# Patient Record
Sex: Male | Born: 1935 | Race: White | Hispanic: No | Marital: Married | Smoking: Former smoker
Health system: Southern US, Community
[De-identification: ages and names within clinical notes are randomized; demographics above are authoritative.]

## PROBLEM LIST (undated history)

## (undated) DIAGNOSIS — I4891 Unspecified atrial fibrillation: Secondary | ICD-10-CM

## (undated) DIAGNOSIS — W19XXXA Unspecified fall, initial encounter: Secondary | ICD-10-CM

## (undated) DIAGNOSIS — K449 Diaphragmatic hernia without obstruction or gangrene: Secondary | ICD-10-CM

## (undated) DIAGNOSIS — R413 Other amnesia: Secondary | ICD-10-CM

## (undated) DIAGNOSIS — F0280 Dementia in other diseases classified elsewhere without behavioral disturbance: Secondary | ICD-10-CM

## (undated) DIAGNOSIS — I10 Essential (primary) hypertension: Secondary | ICD-10-CM

## (undated) DIAGNOSIS — I495 Sick sinus syndrome: Secondary | ICD-10-CM

## (undated) DIAGNOSIS — F32A Depression, unspecified: Secondary | ICD-10-CM

## (undated) DIAGNOSIS — R809 Proteinuria, unspecified: Secondary | ICD-10-CM

## (undated) DIAGNOSIS — K5790 Diverticulosis of intestine, part unspecified, without perforation or abscess without bleeding: Secondary | ICD-10-CM

## (undated) DIAGNOSIS — G309 Alzheimer's disease, unspecified: Secondary | ICD-10-CM

## (undated) DIAGNOSIS — K2970 Gastritis, unspecified, without bleeding: Secondary | ICD-10-CM

## (undated) DIAGNOSIS — N400 Enlarged prostate without lower urinary tract symptoms: Secondary | ICD-10-CM

## (undated) DIAGNOSIS — E119 Type 2 diabetes mellitus without complications: Secondary | ICD-10-CM

## (undated) DIAGNOSIS — K2980 Duodenitis without bleeding: Secondary | ICD-10-CM

## (undated) DIAGNOSIS — Z889 Allergy status to unspecified drugs, medicaments and biological substances status: Secondary | ICD-10-CM

## (undated) DIAGNOSIS — D649 Anemia, unspecified: Secondary | ICD-10-CM

## (undated) DIAGNOSIS — R972 Elevated prostate specific antigen [PSA]: Secondary | ICD-10-CM

## (undated) DIAGNOSIS — R32 Unspecified urinary incontinence: Secondary | ICD-10-CM

## (undated) DIAGNOSIS — E785 Hyperlipidemia, unspecified: Secondary | ICD-10-CM

## (undated) DIAGNOSIS — I2510 Atherosclerotic heart disease of native coronary artery without angina pectoris: Secondary | ICD-10-CM

## (undated) DIAGNOSIS — F329 Major depressive disorder, single episode, unspecified: Secondary | ICD-10-CM

## (undated) HISTORY — DX: Dementia in other diseases classified elsewhere, unspecified severity, without behavioral disturbance, psychotic disturbance, mood disturbance, and anxiety: F02.80

## (undated) HISTORY — DX: Allergy status to unspecified drugs, medicaments and biological substances: Z88.9

## (undated) HISTORY — DX: Diverticulosis of intestine, part unspecified, without perforation or abscess without bleeding: K57.90

## (undated) HISTORY — DX: Other amnesia: R41.3

## (undated) HISTORY — DX: Gastritis, unspecified, without bleeding: K29.70

## (undated) HISTORY — DX: Hyperlipidemia, unspecified: E78.5

## (undated) HISTORY — DX: Duodenitis without bleeding: K29.80

## (undated) HISTORY — PX: CHOLECYSTECTOMY: SHX55

## (undated) HISTORY — DX: Elevated prostate specific antigen (PSA): R97.20

## (undated) HISTORY — DX: Anemia, unspecified: D64.9

## (undated) HISTORY — DX: Sick sinus syndrome: I49.5

## (undated) HISTORY — DX: Benign prostatic hyperplasia without lower urinary tract symptoms: N40.0

## (undated) HISTORY — DX: Unspecified fall, initial encounter: W19.XXXA

## (undated) HISTORY — DX: Unspecified urinary incontinence: R32

## (undated) HISTORY — DX: Atherosclerotic heart disease of native coronary artery without angina pectoris: I25.10

## (undated) HISTORY — DX: Depression, unspecified: F32.A

## (undated) HISTORY — DX: Alzheimer's disease, unspecified: G30.9

## (undated) HISTORY — DX: Major depressive disorder, single episode, unspecified: F32.9

## (undated) HISTORY — PX: OTHER SURGICAL HISTORY: SHX169

## (undated) HISTORY — DX: Diaphragmatic hernia without obstruction or gangrene: K44.9

## (undated) HISTORY — DX: Proteinuria, unspecified: R80.9

## (undated) HISTORY — DX: Essential (primary) hypertension: I10

## (undated) HISTORY — DX: Type 2 diabetes mellitus without complications: E11.9

## (undated) HISTORY — DX: Unspecified atrial fibrillation: I48.91

---
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
## 2001-05-24 ENCOUNTER — Encounter: Payer: Self-pay | Admitting: Emergency Medicine

## 2001-05-24 ENCOUNTER — Emergency Department (HOSPITAL_COMMUNITY): Admission: EM | Admit: 2001-05-24 | Discharge: 2001-05-25 | Payer: Self-pay | Admitting: Emergency Medicine

## 2001-12-23 ENCOUNTER — Encounter: Admission: RE | Admit: 2001-12-23 | Discharge: 2002-03-23 | Payer: Self-pay | Admitting: Family Medicine

## 2002-03-09 ENCOUNTER — Encounter: Payer: Self-pay | Admitting: Emergency Medicine

## 2002-03-09 ENCOUNTER — Emergency Department (HOSPITAL_COMMUNITY): Admission: EM | Admit: 2002-03-09 | Discharge: 2002-03-10 | Payer: Self-pay | Admitting: Emergency Medicine

## 2002-03-24 ENCOUNTER — Encounter: Payer: Self-pay | Admitting: Family Medicine

## 2002-03-24 ENCOUNTER — Ambulatory Visit (HOSPITAL_COMMUNITY): Admission: RE | Admit: 2002-03-24 | Discharge: 2002-03-24 | Payer: Self-pay | Admitting: Family Medicine

## 2002-04-25 ENCOUNTER — Encounter: Admission: RE | Admit: 2002-04-25 | Discharge: 2002-07-24 | Payer: Self-pay | Admitting: Family Medicine

## 2002-08-04 ENCOUNTER — Encounter: Payer: Self-pay | Admitting: Family Medicine

## 2002-08-04 ENCOUNTER — Encounter: Admission: RE | Admit: 2002-08-04 | Discharge: 2002-08-04 | Payer: Self-pay | Admitting: Family Medicine

## 2002-08-11 ENCOUNTER — Encounter: Admission: RE | Admit: 2002-08-11 | Discharge: 2002-08-11 | Payer: Self-pay | Admitting: Family Medicine

## 2002-08-11 ENCOUNTER — Encounter: Payer: Self-pay | Admitting: Family Medicine

## 2002-09-14 ENCOUNTER — Encounter: Payer: Self-pay | Admitting: Internal Medicine

## 2002-10-26 ENCOUNTER — Encounter: Payer: Self-pay | Admitting: Internal Medicine

## 2002-10-31 ENCOUNTER — Encounter: Admission: RE | Admit: 2002-10-31 | Discharge: 2003-01-26 | Payer: Self-pay | Admitting: Family Medicine

## 2003-04-17 ENCOUNTER — Encounter: Admission: RE | Admit: 2003-04-17 | Discharge: 2003-07-16 | Payer: Self-pay | Admitting: Family Medicine

## 2003-07-17 ENCOUNTER — Encounter: Admission: RE | Admit: 2003-07-17 | Discharge: 2003-10-15 | Payer: Self-pay | Admitting: Family Medicine

## 2003-09-07 ENCOUNTER — Ambulatory Visit (HOSPITAL_COMMUNITY): Admission: RE | Admit: 2003-09-07 | Discharge: 2003-09-07 | Payer: Self-pay

## 2004-01-08 ENCOUNTER — Encounter: Admission: RE | Admit: 2004-01-08 | Discharge: 2004-01-08 | Payer: Self-pay | Admitting: Family Medicine

## 2004-04-09 ENCOUNTER — Encounter: Admission: RE | Admit: 2004-04-09 | Discharge: 2004-07-08 | Payer: Self-pay | Admitting: Family Medicine

## 2004-05-26 ENCOUNTER — Emergency Department (HOSPITAL_COMMUNITY): Admission: EM | Admit: 2004-05-26 | Discharge: 2004-05-26 | Payer: Self-pay | Admitting: Emergency Medicine

## 2004-05-28 ENCOUNTER — Encounter: Admission: RE | Admit: 2004-05-28 | Discharge: 2004-05-28 | Payer: Self-pay | Admitting: Family Medicine

## 2004-09-20 ENCOUNTER — Encounter: Admission: RE | Admit: 2004-09-20 | Discharge: 2004-11-04 | Payer: Self-pay | Admitting: Family Medicine

## 2004-10-18 ENCOUNTER — Encounter: Admission: RE | Admit: 2004-10-18 | Discharge: 2004-10-18 | Payer: Self-pay | Admitting: Family Medicine

## 2005-04-21 ENCOUNTER — Encounter: Admission: RE | Admit: 2005-04-21 | Discharge: 2005-04-21 | Payer: Self-pay | Admitting: Family Medicine

## 2005-05-01 ENCOUNTER — Emergency Department (HOSPITAL_COMMUNITY): Admission: EM | Admit: 2005-05-01 | Discharge: 2005-05-01 | Payer: Self-pay | Admitting: Emergency Medicine

## 2005-07-10 ENCOUNTER — Encounter: Admission: RE | Admit: 2005-07-10 | Discharge: 2005-07-10 | Payer: Self-pay | Admitting: Family Medicine

## 2005-11-11 ENCOUNTER — Encounter: Admission: RE | Admit: 2005-11-11 | Discharge: 2005-11-11 | Payer: Self-pay | Admitting: Family Medicine

## 2006-01-20 ENCOUNTER — Encounter: Admission: RE | Admit: 2006-01-20 | Discharge: 2006-04-20 | Payer: Self-pay | Admitting: Family Medicine

## 2006-02-23 ENCOUNTER — Encounter: Admission: RE | Admit: 2006-02-23 | Discharge: 2006-02-23 | Payer: Self-pay | Admitting: Family Medicine

## 2006-06-18 ENCOUNTER — Encounter: Admission: RE | Admit: 2006-06-18 | Discharge: 2006-06-18 | Payer: Self-pay | Admitting: Family Medicine

## 2006-09-16 ENCOUNTER — Encounter: Admission: RE | Admit: 2006-09-16 | Discharge: 2006-11-10 | Payer: Self-pay | Admitting: Family Medicine

## 2006-09-29 ENCOUNTER — Emergency Department (HOSPITAL_COMMUNITY): Admission: EM | Admit: 2006-09-29 | Discharge: 2006-09-29 | Payer: Self-pay | Admitting: Emergency Medicine

## 2006-10-01 HISTORY — PX: OTHER SURGICAL HISTORY: SHX169

## 2006-10-17 ENCOUNTER — Encounter: Admission: RE | Admit: 2006-10-17 | Discharge: 2006-10-17 | Payer: Self-pay | Admitting: Neurology

## 2006-12-16 ENCOUNTER — Encounter: Admission: RE | Admit: 2006-12-16 | Discharge: 2006-12-16 | Payer: Self-pay | Admitting: Family Medicine

## 2007-06-16 ENCOUNTER — Encounter: Admission: RE | Admit: 2007-06-16 | Discharge: 2007-06-16 | Payer: Self-pay | Admitting: Family Medicine

## 2007-07-05 ENCOUNTER — Inpatient Hospital Stay (HOSPITAL_COMMUNITY): Admission: EM | Admit: 2007-07-05 | Discharge: 2007-07-08 | Payer: Self-pay | Admitting: Emergency Medicine

## 2007-07-06 ENCOUNTER — Ambulatory Visit: Payer: Self-pay | Admitting: Vascular Surgery

## 2007-07-06 ENCOUNTER — Encounter (INDEPENDENT_AMBULATORY_CARE_PROVIDER_SITE_OTHER): Payer: Self-pay | Admitting: *Deleted

## 2007-07-06 ENCOUNTER — Encounter (INDEPENDENT_AMBULATORY_CARE_PROVIDER_SITE_OTHER): Payer: Self-pay | Admitting: Internal Medicine

## 2007-07-07 HISTORY — PX: CARDIAC CATHETERIZATION: SHX172

## 2008-07-19 ENCOUNTER — Encounter: Admission: RE | Admit: 2008-07-19 | Discharge: 2008-07-19 | Payer: Self-pay | Admitting: Family Medicine

## 2009-03-20 ENCOUNTER — Emergency Department (HOSPITAL_COMMUNITY): Admission: EM | Admit: 2009-03-20 | Discharge: 2009-03-20 | Payer: Self-pay | Admitting: Emergency Medicine

## 2009-04-17 ENCOUNTER — Inpatient Hospital Stay (HOSPITAL_COMMUNITY): Admission: RE | Admit: 2009-04-17 | Discharge: 2009-04-18 | Payer: Self-pay | Admitting: Cardiology

## 2009-04-17 HISTORY — PX: PERMANENT PACEMAKER INSERTION: SHX6023

## 2009-06-15 ENCOUNTER — Ambulatory Visit (HOSPITAL_COMMUNITY): Admission: RE | Admit: 2009-06-15 | Discharge: 2009-06-15 | Payer: Self-pay | Admitting: Cardiovascular Disease

## 2009-06-15 ENCOUNTER — Encounter (INDEPENDENT_AMBULATORY_CARE_PROVIDER_SITE_OTHER): Payer: Self-pay | Admitting: Cardiovascular Disease

## 2009-06-19 ENCOUNTER — Emergency Department (HOSPITAL_COMMUNITY): Admission: EM | Admit: 2009-06-19 | Discharge: 2009-06-19 | Payer: Self-pay | Admitting: Emergency Medicine

## 2009-06-25 ENCOUNTER — Encounter: Admission: RE | Admit: 2009-06-25 | Discharge: 2009-06-25 | Payer: Self-pay | Admitting: Cardiovascular Disease

## 2009-07-30 ENCOUNTER — Encounter: Payer: Self-pay | Admitting: Internal Medicine

## 2009-07-31 ENCOUNTER — Ambulatory Visit (HOSPITAL_COMMUNITY): Admission: RE | Admit: 2009-07-31 | Discharge: 2009-07-31 | Payer: Self-pay | Admitting: Endocrinology

## 2009-08-22 ENCOUNTER — Encounter: Admission: RE | Admit: 2009-08-22 | Discharge: 2009-08-22 | Payer: Self-pay | Admitting: Endocrinology

## 2009-08-22 ENCOUNTER — Other Ambulatory Visit: Admission: RE | Admit: 2009-08-22 | Discharge: 2009-08-22 | Payer: Self-pay | Admitting: Interventional Radiology

## 2010-01-22 ENCOUNTER — Telehealth: Payer: Self-pay | Admitting: Internal Medicine

## 2010-01-22 ENCOUNTER — Encounter: Payer: Self-pay | Admitting: Internal Medicine

## 2010-01-23 DIAGNOSIS — E041 Nontoxic single thyroid nodule: Secondary | ICD-10-CM

## 2010-01-23 DIAGNOSIS — K222 Esophageal obstruction: Secondary | ICD-10-CM

## 2010-01-23 DIAGNOSIS — E1159 Type 2 diabetes mellitus with other circulatory complications: Secondary | ICD-10-CM | POA: Insufficient documentation

## 2010-01-23 DIAGNOSIS — K921 Melena: Secondary | ICD-10-CM | POA: Insufficient documentation

## 2010-01-23 DIAGNOSIS — D509 Iron deficiency anemia, unspecified: Secondary | ICD-10-CM | POA: Insufficient documentation

## 2010-01-23 DIAGNOSIS — K5730 Diverticulosis of large intestine without perforation or abscess without bleeding: Secondary | ICD-10-CM | POA: Insufficient documentation

## 2010-01-23 DIAGNOSIS — I10 Essential (primary) hypertension: Secondary | ICD-10-CM

## 2010-01-23 DIAGNOSIS — R413 Other amnesia: Secondary | ICD-10-CM

## 2010-01-23 DIAGNOSIS — I6350 Cerebral infarction due to unspecified occlusion or stenosis of unspecified cerebral artery: Secondary | ICD-10-CM | POA: Insufficient documentation

## 2010-01-23 DIAGNOSIS — E785 Hyperlipidemia, unspecified: Secondary | ICD-10-CM | POA: Insufficient documentation

## 2010-01-23 DIAGNOSIS — K449 Diaphragmatic hernia without obstruction or gangrene: Secondary | ICD-10-CM | POA: Insufficient documentation

## 2010-01-23 DIAGNOSIS — I4891 Unspecified atrial fibrillation: Secondary | ICD-10-CM

## 2010-01-24 ENCOUNTER — Ambulatory Visit: Payer: Self-pay | Admitting: Internal Medicine

## 2010-01-24 LAB — CONVERTED CEMR LAB
Basophils Absolute: 0.1 10*3/uL (ref 0.0–0.1)
Basophils Relative: 1.3 % (ref 0.0–3.0)
Hemoglobin: 10.9 g/dL — ABNORMAL LOW (ref 13.0–17.0)
Lymphocytes Relative: 27.8 % (ref 12.0–46.0)
Monocytes Relative: 12.3 % — ABNORMAL HIGH (ref 3.0–12.0)
Neutro Abs: 4.4 10*3/uL (ref 1.4–7.7)
RBC: 3.7 M/uL — ABNORMAL LOW (ref 4.22–5.81)
RDW: 12.9 % (ref 11.5–14.6)

## 2010-01-29 ENCOUNTER — Encounter: Payer: Self-pay | Admitting: Internal Medicine

## 2010-01-30 ENCOUNTER — Encounter: Payer: Self-pay | Admitting: Internal Medicine

## 2010-02-26 ENCOUNTER — Ambulatory Visit
Admission: RE | Admit: 2010-02-26 | Discharge: 2010-02-26 | Payer: Self-pay | Source: Home / Self Care | Attending: Internal Medicine | Admitting: Internal Medicine

## 2010-02-26 ENCOUNTER — Other Ambulatory Visit: Payer: Self-pay | Admitting: Internal Medicine

## 2010-02-26 DIAGNOSIS — K2980 Duodenitis without bleeding: Secondary | ICD-10-CM

## 2010-02-26 DIAGNOSIS — K5790 Diverticulosis of intestine, part unspecified, without perforation or abscess without bleeding: Secondary | ICD-10-CM

## 2010-02-26 DIAGNOSIS — K449 Diaphragmatic hernia without obstruction or gangrene: Secondary | ICD-10-CM

## 2010-02-26 DIAGNOSIS — K2970 Gastritis, unspecified, without bleeding: Secondary | ICD-10-CM

## 2010-02-26 HISTORY — DX: Gastritis, unspecified, without bleeding: K29.70

## 2010-02-26 HISTORY — DX: Duodenitis without bleeding: K29.80

## 2010-02-26 HISTORY — DX: Diaphragmatic hernia without obstruction or gangrene: K44.9

## 2010-02-26 HISTORY — DX: Diverticulosis of intestine, part unspecified, without perforation or abscess without bleeding: K57.90

## 2010-02-27 LAB — GLUCOSE, CAPILLARY
Glucose-Capillary: 119 mg/dL — ABNORMAL HIGH (ref 70–99)
Glucose-Capillary: 93 mg/dL (ref 70–99)

## 2010-03-04 ENCOUNTER — Encounter: Payer: Self-pay | Admitting: Internal Medicine

## 2010-03-14 NOTE — Letter (Signed)
Summary: [facility]   Imported By: Lamona Curl CMA (AAMA) 01/23/2010 14:51:40  _____________________________________________________________________  External Attachment:    Type:   Image     Comment:   External Document

## 2010-03-14 NOTE — Letter (Signed)
Summary: Patient Notice-Endo Biopsy Results  [facility]  [address] [place], [place] [address]   Phone: [phone]  Fax: [phone]        March 04, 2010 MRN: [id number]    Jon Davis [address] [place], [place]  [address]    Dear Mr. KIMBERLIN,  I am pleased to inform you that the biopsies taken during your recent endoscopic examination did not show any evidence of cancer upon pathologic examination.  Additional information/recommendations:  __No further action is needed at this time.  Please follow-up with      your primary care physician for your other healthcare needs.  __ Please call [phone] to schedule a return visit to review      your condition.  _x_ Continue with the treatment plan as outlined on the day of your      exam. Zantac 150 mg daily  _   Please call us if you are having persistent problems or have questions about your condition that have not been fully answered at this time.  Sincerely,  Hart Carwin MD  This letter has been electronically signed by your physician.  Appended Document: Patient Notice-Endo Biopsy Results LETTER MAILED

## 2010-03-14 NOTE — Assessment & Plan Note (Signed)
Summary: Gastroenterology  Jon Davis, Jon Davis    Office No. [id number] Page September 14, 2002    Lanae Boast, M.D. [address] [place], [address] [place], [place] [address]   RE:     Jon Davis, Jon Davis    Office No. [id number] DOB:  2026-04-21  Dear Maryruth Hancock:  Thank you so much for sending Jon Davis to [place] for evaluation of swallowing problems as well as iron deficiency anemia.  Thank you for forwarding your office notes.  He is a very nice 77 year old gentleman who has been a patient of yours for a long time and has had multiple transient ischemic attacks, for which he takes Plavix and aspirin 81 mg a day.  He also has a history of gastroesophageal reflux, which has been quite well controlled with Prevacid 30 mg a day.  Recently, he has developed sensation of pain or dysphagia usually with the first bite or first swallow of a liquid.  He has not had any regurgitation of the food, but had some uneasy feeling in his lower mid-sternum.  There has been hoarseness or noticeable cough.  He has never had upper endoscopy.  His other problem has been occasional constipation and gas.  PAST MEDICAL HISTORY ALLERGIES:   None.  MEDICATIONS:    Prevacid 30 mg p.o. q.d., Hyzaar 50/12.5 p.o. q.d., Plavix 75 mg p.o. q.d., Pravachol 40 mg p.o. q.d., aspirin 81 mg p.o. q.d., alprazolam 0.5 mg p.r.n., hydrocodone p.r.n., and Darvocet N 100 p.r.n.  Past illnesses:  Diabetes, diet controlled, hyperlipidemia, cerebrovascular accident/transient ischemic attack, arthritis of the cervical spine, depression.  Operations:  Cholecystectomy and prostate surgery in 2002, and tonsillectomy.   FAMILY HISTORY:   Positive for heart disease in father.   SOCIAL HISTORY:   Stopped smoking in 1975.  Drinks alcohol rarely.  He is married, has children.  REVIEW OF SYSTEMS:   Positive for arthritis, constipation, and difficulty swallowing.   PHYSICAL EXAM:   Blood pressure 120/58.  Weight 193 pounds.  Pulse 64.  He was  alert and oriented, speaking rather slowly, but answering appropriately.  Lungs were clear to auscultation.  COR:  Normal S1, normal S2.  Sclerae was nonicteric.  Abdomen was soft and nontender, relaxed, normoactive bowel sounds, liver edge at costal margin and no bruit.  Rectal exam showed 2+ prostate, soft, regular.  Stool was hemoccult negative.  Extremities:  No edema.   LABORATORIES:   In the office shows that he had 11% iron saturation with a hemoglobin of 12.2.   IMPRESSION:   77 year old gentleman with iron deficiency anemia without obvious source of bleeding, which could be either from long term aspirin use causing minor disruption of the lining of the stomach or the small bowel.  He may possibly have a low gastrointestinal lesion since he has never had a colonoscopy.  His dysphagia may be related to hiatal hernia, esophageal ring, Schatzki's ring, or possibly due to mild stricture.  PLAN:  I have given Mr. Lesesne some iron samples to take and asked him to continue Prevacid 30 mg a day.  I think he needs both upper endoscopy and colonoscopy to evaluate his anemia as well as his swallowing problems.  I doubt that we are dealing with Barrett's esophagus, but that needs to be also ruled out.  Question with him is whether he should stay on his Plavix and aspirin because of the previous history of transient ischemic attacks.  I am reluctant to automatically stop it.  We  usually stop it seven days prior to the procedure and then if he has polyps, he would have to still stay off Plavix and aspirin for another 14 days, which would be a total of three weeks.  I am not sure whether that is not too risky considering his past history.  I would like your input into this.   Could you please let me know whether we should discontinue or reduce the dose of his anticoagulants or perhaps just do the procedures while he is on the medications and in case we find polyp, of course I would not be able to remove  it, but I probably would be able to biopsy safely any abnormal tissue.  He has been scheduled for sometime in September and we have plenty of time to make that decision about instant coagulation.    Thank you so much for letting me share in his care.   Best regards,    Jon Davis. Jon Davis, M.D.  [id number] D:  2002-09-14 00:00:00.0; T:  ; Job [id number]

## 2010-03-14 NOTE — Procedures (Addendum)
Summary: Upper Endoscopy  Patient: Jon Davis Note: All result statuses are Final unless otherwise noted.  Tests: (1) Upper Endoscopy (EGD)   EGD Upper Endoscopy       DONE     [facility]     [address]     [place], [place]  [address]           ENDOSCOPY PROCEDURE REPORT           PATIENT:  Jon Davis, Jon Davis  MR#:  [id number]     BIRTHDATE:  [birth year], 83 yrs. old  GENDER:  male           ENDOSCOPIST:  Hedwig Morton. Juanda Chance, MD     Referred by:  Ace Gins, M.D.           PROCEDURE DATE:  02/26/2010     PROCEDURE:  EGD with biopsy, 43239, Maloney Dilation of Esophagus     ASA CLASS:  Class III     INDICATIONS:  dysphagia, iron deficiency anemia Hgb 10.8, iron sat     6%     EGD 2004 showed es. stricture, dilated to 33F           MEDICATIONS:   Versed 4 mg, Fentanyl 25 mcg     TOPICAL ANESTHETIC:  Exactacain Spray           DESCRIPTION OF PROCEDURE:   After the risks benefits and     alternatives of the procedure were thoroughly explained, informed     consent was obtained.  [facility] [id number] endoscope was     introduced through the mouth and advanced to the second portion of     the duodenum, without limitations.  The instrument was slowly     withdrawn as the mucosa was fully examined.     <<PROCEDUREIMAGES>>           Presbyesophagus was found (see image1 and image11). tortuous     esophagus, no definite stricture maloney dilator 33F passed  A     hiatal hernia was found (see image11 and image1). 2 cm small HH     Moderate gastritis was found. linear erosions antrum, low grade     blood loss With standard forceps, a biopsy was obtained and sent     to pathology (see image2).  Duodenitis was found (see image3,     image4, image5, image6, and image7). 1-2 mm superficial     ulcerations  Otherwise the examination was normal (see image10 and     image9).    Retroflexed views revealed no abnormalities.    The     scope was then withdrawn from the patient  and the procedure     completed.           COMPLICATIONS:  None           ENDOSCOPIC IMPRESSION:     1) Presbyesophagus     2) Hiatal hernia     3) Moderate gastritis     4) Duodenitis     5) Otherwise normal examination     low grade GI blood loss from gastric and duodenal erosions     RECOMMENDATIONS:     1) Await biopsy results     add PPI or Zantac 150 mg po qd           REPEAT EXAM:  No           ______________________________     Hedwig Morton. Juanda Chance, MD  CC:  Ace Gins, M.D.           n.     eSIGNED:   Hedwig Morton. Madora Barletta at 02/26/2010 03:04 PM           Page 2 of 3   [place], [place], [id number]  Note: An exclamation mark (!) indicates a result that was not dispersed into the flowsheet. Document Creation Date: 02/26/2010 3:04 PM _______________________________________________________________________  (1) Order result status: Final Collection or observation date-time: 02/26/2010 14:35 Requested date-time:  Receipt date-time:  Reported date-time:  Referring Physician:   Ordering Physician: Lina Sar [phone]) Specimen Source:  Source: Launa Grill Order Number: [id number] Lab site:

## 2010-03-14 NOTE — Procedures (Signed)
Summary: EGD    Patient Name: Jon Davis, Jon Davis MRN:  Procedure Procedures: Panendoscopy (EGD) CPT: 43235.    with esophageal dilation. CPT: G9296129.  Personnel: Endoscopist: Dora L. Juanda Chance, MD.  Referred By: Talmadge Coventry, MD.  Exam Location: Exam performed in Outpatient Clinic. Outpatient  Patient Consent: Procedure, Alternatives, Risks and Benefits discussed, consent obtained, from patient. Consent was obtained by the RN.  Indications Symptoms: Dysphagia. Reflux symptoms for 1-5 yrs, occurring daily. hiccups.  History  Current Medications: Patient is taking a non-steroidal medication. Patient is on an anticoagulant.  Comments: Plavix and ASA d/c'ed  1 week ago Pre-Exam Physical: Performed Oct 26, 2002  Cardio-pulmonary exam, HEENT exam, Abdominal exam, Extremity exam, Neurological exam, Mental status exam WNL.  Exam Exam Info: Maximum depth of insertion Duodenum, intended Duodenum. Vocal cords visualized. Gastric retroflexion performed. Images taken. ASA Classification: II. Tolerance: good.  Sedation Meds: Patient assessed and found to be appropriate for moderate (conscious) sedation. Fentanyl 25 mcg. given IV. Versed 3 mg. given IV.  Monitoring: BP and pulse monitoring done. Oximetry used. Supplemental O2 given  Findings Dilation: Distal Esophagus. Maloney dilator used, Diameter: 42,44,46, 48 mm, 4  total dilators used. Patient tolerance excellent. Outcome: successful.  STRICTURE / STENOSIS: Distal Esophagus.  Constriction: partial. Etiology: benign due to reflux. 35 cm from mouth. Lumen diameter is 14 mm. ICD9: Esophageal Stricture: 530.3. Comment: no active erosions.  - HIATAL HERNIA: Diaphragm 38 cm from mouth. Z-line/GE Junction 35 cm from mouth. 3 cms. in length. reducible. ICD9: Hernia, Hiatal: 553.3. Normal: Duodenal Apex.   Assessment Abnormal examination, see findings above.  Diagnoses: 553.3: Hernia, Hiatal.  530.3: Esophageal Stricture.    Comments: es. stricture, s/p dilation to 83F using Maloney dilators Events  Unplanned Intervention: No unplanned interventions were required.  Unplanned Events: There were no complications. Plans Medication(s): PPI: Lansoprazole/Prevacid 30 mg BID, starting Oct 26, 2002   Patient Education: Patient given standard instructions for: Hiatal Hernia. Reflux.  Comments: Prevacid bid for 2 weeks then qd, resume Plavix, reduce ASA to 81 mg qd with food Disposition: After procedure patient sent to recovery. After recovery patient sent home.     cc: Harvie Heck, M.D.  This report was created from the original endoscopy report, which was reviewed and signed by the above listed endoscopist.

## 2010-03-14 NOTE — Assessment & Plan Note (Signed)
Summary: Anemia/Jon Davis   History of Present Illness Visit Type: Initial Consult Primary GI MD: Lina Sar MD Primary Provider: Ace Gins, MD Requesting Provider: Ace Gins, MD Chief Complaint: anemia cannot find the source.  C/o alot of pain in groin, fatigue, and blood in stool. History of Present Illness:   This is an 77 year old white male who has iron deficiency anemia. His hemoglobin 10.8 on 01/23/10, MCV was 86 and he had a 6% iron saturation. We have seen him for a colorectal screening in the past. A colonoscopy in September 2004 showed mild diverticulosis of the left colon. An upper endoscopy at that time showed only a mild esophageal stricture. He was dilated with a Maloney dilator from 42-48 [place]. There was a 3 cm hiatal hernia. He has been on Coumadin for atrial fibrillation since March 2011. He also has a permanent transvenous pacemaker. He is a diabetic. Patient is complaining of solid food dysphagia and hiccups.   GI Review of Systems    Reports abdominal pain, chest pain, and  dysphagia with solids.      Denies acid reflux, belching, bloating, dysphagia with liquids, heartburn, loss of appetite, nausea, vomiting, vomiting blood, weight loss, and  weight gain.      Reports black tarry stools, constipation, and  rectal bleeding.     Denies anal fissure, change in bowel habit, diarrhea, diverticulosis, fecal incontinence, heme positive stool, hemorrhoids, irritable bowel syndrome, jaundice, light color stool, liver problems, and  rectal pain.    Current Medications (verified): 1)  Plavix 75 Mg Tabs (Clopidogrel Bisulfate) .[name].. 1 By Mouth At Bedtime 2)  Hydrocodone-Acetaminophen 5-500 Mg Tabs (Hydrocodone-Acetaminophen) .[name].. 1-2 Q 4 Hours As Needed For Pain 3)  Sertraline Hcl 50 Mg Tabs (Sertraline Hcl) .[name].. 1 By Mouth Once Daily 4)  Crestor 20 Mg Tabs (Rosuvastatin Calcium) .[name].. 1 By Mouth Once Daily 5)  Isosorbide Mononitrate Cr 30 Mg Xr24h-Tab (Isosorbide  Mononitrate) .[name].. 1 By Mouth Once Daily 6)  Alprazolam 0.25 Mg Tabs (Alprazolam) .[name].. 1 By Mouth Three Times A Day As Needed Anxiety 7)  Glipizide 5 Mg Tabs (Glipizide) .[name].. 1 By Mouth Every Morning 8)  Tramadol Hcl 50 Mg Tabs (Tramadol Hcl) .[name].. 1-2 Three Times A Day As Needed Pain 9)  Amlodipine Besylate 5 Mg Tabs (Amlodipine Besylate) .[name].. 1 By Mouth Once Daily 10)  Aricept 23 Mg Tabs (Donepezil Hcl) .... 1/2 Tablet Every Morning and 1/2 Tablet At Bedtime 11)  Coumadin 5 Mg Tabs (Warfarin Sodium) .... Take As Directed 12)  Aspirin 81 Mg Tbec (Aspirin) .[name].. 1 By Mouth Once Daily  Allergies (verified): No Known Drug Allergies  Past History:  Past Medical History: Reviewed history from 01/23/2010 and no changes required. Current Problems:  THYROID NODULE (ICD-241.0) DIABETES MELLITUS, TYPE II (ICD-250.00) HYPERTENSION (ICD-401.9) HYPERLIPIDEMIA (ICD-272.4) DEMENTIA (ICD-294.8) ATRIAL FIBRILLATION (ICD-427.31) ANEMIA, IRON DEFICIENCY (ICD-280.9) HEMOCCULT POSITIVE STOOL (ICD-578.1) DIVERTICULOSIS, COLON (ICD-562.10) Hx of ESOPHAGEAL STRICTURE (ICD-530.3) HIATAL HERNIA (ICD-553.3)    Past Surgical History: "TUNA"-Prostate Surgery Tonsillectomy Pacemaker placement Cholecystectomy Polyp removal from vocal cords Appendectomy  Family History: Family History of Heart Disease: Father Family History of Kidney Disease: brother No FH of Colon Cancer:  Social History: Reviewed history from 01/23/2010 and no changes required. Patient is a former smoker. -stopped in 1975 Alcohol Use - yes-rare Illicit Drug Use - no married  5 children  Daily Caffeine Use  Review of Systems       The patient complains of fatigue, urination changes/pain, and urine leakage.  The  patient denies allergy/sinus, anemia, anxiety-new, arthritis/joint pain, back pain, blood in urine, breast changes/lumps, change in vision, confusion, cough, coughing up blood, depression-new, fainting, fever,  headaches-new, hearing problems, heart murmur, heart rhythm changes, itching, muscle pains/cramps, night sweats, nosebleeds, shortness of breath, skin rash, sleeping problems, sore throat, swelling of feet/legs, swollen lymph glands, thirst - excessive, urination - excessive, vision changes, and voice change.         Pertinent positive and negative review of systems were noted in the above HPI. All other ROS was otherwise negative.   Vital Signs:  Patient profile:   77 year old male Height:      72 inches Weight:      198 pounds BMI:     26.95 Pulse rate:   68 / minute Pulse rhythm:   regular BP sitting:   108 / 50  (left arm)  Vitals Entered By: Milford Cage NCMA (January 24, 2010 10:15 AM)  Physical Exam  General:  elderly-appearing alert and oriented but chronically ill Eyes:  nonicteric Mouth:  normal oral mucosa Neck:  jugular veins not distended Lungs:  clear breath sounds Heart:  normal S1 normal S2 Abdomen:  soft nontender abdomen with normoactive bowel sounds. No focal tenderness. Liver edge at costal margin. No bruit Rectal:  large amount of hard Hemoccult positive stool Skin:  Intact without significant lesions or rashes. Psych:  Alert and cooperative. Normal mood and affect.   Impression & Recommendations:  Problem # 1:  ANEMIA, IRON DEFICIENCY (ICD-280.9) Patient has iron deficiency anemia with an iron saturation of 6% and heme positive stool. A prior colonoscopy in 2004 did not show any lesion. He is now on Coumadin and complains of dysphagia. His blood loss may be related to chronic esophagitis and an esophageal stricture. We still need to rule out a lower GI source of bleeding such as a polyp or cancer. We will schedule an upper endoscopy and colonoscopy off Coumadin. Of note, should his hemoglobin ever become low enough that he needs blood, patient requests that no blood products be administered to him.  Orders: Colon/Endo (Colon/Endo) TLB-CBC Platelet -  w/Differential (85025-CBCD) TLB-B12, Serum-Total ONLY (04540-J81)  Problem # 2:  HEMOCCULT POSITIVE STOOL (ICD-578.1) We need to rule out upper versus lower GI source of bleeding.  Orders: Colon/Endo (Colon/Endo)  Problem # 3:  Hx of ESOPHAGEAL STRICTURE (ICD-530.3) Patient has recurrent solid food dysphagia and hiccups indicative of possible esophagitis. He will most likely need a proton pump inhibitor to control his symptoms. For now, we will schedule him for an upper endoscopy with possible dilatation.  Patient Instructions: 1)  You have been scheduled for an endoscopyand possible dilatation and colonoscopy. Please follow written instructions that were given to you at your office visit today. 2)  Please pick up your prescription for Miralax, Dulcolax and Reglan at the pharmacy. An electronic presription has already been sent.  3)  Your physician requests that you go to the basement floor of our office to have the following labwork completed before leaving today: CBC, Vitamin B12 level. 4)  Copy sent to : [facility]. 5)  The medication list was reviewed and reconciled.  All changed / newly prescribed medications were explained.  A complete medication list was provided to the patient / caregiver. Prescriptions: DULCOLAX 5 MG  TBEC (BISACODYL) Day before procedure take 2 at 3pm and 2 at 8pm.  #4 x 0   Entered by:   Lamona Curl CMA (AAMA)  Authorized by:   Hart Carwin MD   Signed by:   Lamona Curl CMA (AAMA) on 01/24/2010   Method used:   Elect[facility] to        [id number]       [address]       [place], [place]  [address]       Ph: [phone] or [phone]       Fax: [phone]   RxID:   [id number] REGLAN 10 MG  TABS (METOCLOPRAMIDE HCL) As per prep instructions.  #2 x 0   Entered by:   Lamona Curl CMA (AAMA)   Authorized by:   Hart Carwin MD   Signed by:   Lamona Curl CMA (AAMA) on 01/24/2010   Method used:   [facility]        [facility]  [address] [id number]       [address]       [place], [place]  [address]       Ph: [phone] or [phone]       Fax: [phone]   RxID:   [id number] MIRALAX   POWD (POLYETHYLENE GLYCOL 3350) As per prep  instructions.  #255gm x 0   Entered by:   Lamona Curl CMA (AAMA)   Authorized by:   Hart Carwin MD   Signed by:   Lamona Curl CMA (AAMA) on 01/24/2010   Method used:   [facility]        [facility]  [address] [facility]       [address]       [place], [place]  [address]       Ph: [phone] or [phone]       Fax: [phone]   RxID:   [id number]

## 2010-03-14 NOTE — Letter (Signed)
Summary: [facility]   Imported By: Lamona Curl CMA (AAMA) 01/23/2010 14:52:53  _____________________________________________________________________  External Attachment:    Type:   Image     Comment:   External Document

## 2010-03-14 NOTE — Miscellaneous (Signed)
Summary: New Rx Zantac/Iron Supplements  Clinical Lists Changes  Medications: Added new medication of ZANTAC 150 MG TABS (RANITIDINE HCL) 1 tablet by mouth daily - Signed Added new medication of FERROUS SULFATE 325 (65 FE) MG TABS (FERROUS SULFATE) 1 tablet by mouth three times daily - Signed Rx of ZANTAC 150 MG TABS (RANITIDINE HCL) 1 tablet by mouth daily;  #30 x 6;  Signed;  Entered by: Grier Rocher, RN;  Authorized by: Hart Carwin MD;  Method used: [facility]  [address] [id number] [address], [place], [place]  [address], Ph: [phone] or [phone], Fax: [phone] Rx of FERROUS SULFATE 325 (65 FE) MG TABS (FERROUS SULFATE) 1 tablet by mouth three times daily;  #100 x 1;  Signed;  Entered by: Grier Rocher, RN;  Authorized by: Hart Carwin MD;  Method used: [facility]  [address] [facility] [address] [place], [place], [place]  [address], Ph: [phone] or [phone], Fax: [phone]    Prescriptions: FERROUS SULFATE 325 (65 FE) MG TABS (FERROUS SULFATE) 1 tablet by mouth three times daily  #100 x 1   Entered by:   Grier Rocher, RN   Authorized by:   Hart Carwin MD   Signed by:   Grier Rocher, RN on 02/26/2010   Method used:   Electronically to        [facility]  [address] [facility]       [address]       [place], [place]  [address]       Ph: [phone] or [phone]       Fax: [phone]   RxID:   [id number] ZANTAC 150 MG TABS (RANITIDINE HCL) 1 tablet by mouth daily  #30 x 6   Entered by:   Grier Rocher, RN   Authorized by:   Hart Carwin MD   Signed by:   Grier Rocher, RN on 02/26/2010   Method used:   Electronically [facility]        [facility]  [address] [facility]       [address]       [place], [place]  [address]       Ph: [phone] or [phone]       Fax: [phone]   RxID:   [id number]

## 2010-03-14 NOTE — Progress Notes (Signed)
Summary: Anemia-Needs appt within 1-2wks  Phone Note From Other Clinic Call back at [facility] Phone [phone]   Caller: Omer Jack Stone's office [phone] Call For: Dr Juanda Chance Reason for Call: Schedule Patient Appt Summary of Call: Would like pt seen within i to 2wks for new onset anemia.  Last office visit w/dr Juanda Chance 09-14-2002 Had Endo Colon 10-26-2002. Would like for [place] to call patient back directy with appoinment. Initial call taken by: Leanor Kail [facility],  January 22, 2010 1:22 PM  Follow-up for Phone Call        Called and spoke with patient's wife. Scheduled appointment for 01/24/10 at 10:45 AM. Dr. Hayden Rasmussen office to fax records. Faxed appt date to Angie at Dr. Hayden Rasmussen office [phone]) Follow-up by: Jesse Fall RN,  January 22, 2010 3:10 PM

## 2010-03-14 NOTE — Letter (Signed)
Summary: Diabetic Instructions   Gastroenterology  [address] [place], [place] [address]   Phone: [phone]  Fax: [phone]    Jon Davis 1926-05-21 MRN: [id number]   _ x _   ORAL DIABETIC MEDICATION INSTRUCTIONS           GLIPIZIDE The day before your procedure:   Take your diabetic pill as you do normally  The day of your procedure:   Do not take your diabetic pill    We will check your blood sugar levels during the admission process and again in Recovery before discharging you home  ________________________________________________________________________

## 2010-03-14 NOTE — Letter (Signed)
Summary: Anticoagulation Modification Letter  [facility]  [address] [place], [place] [address]   Phone: [phone]  Fax: [phone]    January 24, 2010  Re:    Jon Davis DOB:    04/06/1926 MRN:    [id number]    Dear Croitroru:  We have scheduled the above patient for an endoscopic procedure (endoscopy/colonoscopy). Our records show that he is on anticoagulation therapy. Please advise as to how long the patient may come off their therapy of Coumadin prior to the scheduled procedure(s) on 02/26/10.   Please fax back the completed form to Dottie at [phone].  Thank you for your help with this matter.  Sincerely,  Dottie Nelson-Smith CMA Duncan Dull)   Physician Recommendation:   Hold Coumadin 5 days prior ____________  Other ______________________________     Appended Document: Anticoagulation Modification Letter see scanned note from Dr Crawford Givens. Per [facility], patient okay to discontinue coumadin 5 days prior to test. I have spoken to patient's wife (also his caregiver) and have advised her that patient has been okayed to discontine coumadin 5 days prior to test and patient to restart after Dr Juanda Chance gives okay. She verbalizes understanding.

## 2010-03-14 NOTE — Letter (Signed)
Summary: [facility] Instructions  [facility]  [address] [place], [place] [address]   Phone: [phone]  Fax: [phone]       Jon Davis    1926-11-07    MRN: [id number]       Procedure Day /Date: Tuesday 02/26/10     Arrival Time: 1:00 pm     Procedure Time: 2:00 pm     Location of Procedure:                    Jon Davis  [facility] (4th Floor)  PREPARATION FOR COLONOSCOPY WITH MIRALAX  Starting 5 days prior to your procedure 02/21/10 do not eat nuts, seeds, popcorn, corn, beans, peas,  salads, or any raw vegetables.  Do not take any fiber supplements (e.g. Metamucil, Citrucel, and Benefiber). ____________________________________________________________________________________________________   THE DAY BEFORE YOUR PROCEDURE         DATE: 02/25/10 DAY: Monday  1   Drink clear liquids the entire day-NO SOLID FOOD  2   Do not drink anything colored red or purple.  Avoid juices with pulp.  No orange juice.  3   Drink at least 64 oz. (8 glasses) of fluid/clear liquids during the day to prevent dehydration and help the prep work efficiently.  CLEAR LIQUIDS INCLUDE: Water Jello Ice Popsicles Tea (sugar ok, no milk/cream) Powdered fruit flavored drinks Coffee (sugar ok, no milk/cream) Gatorade Juice: apple, white grape, white cranberry  Lemonade Clear bullion, consomm, broth Carbonated beverages (any kind) Strained chicken noodle soup Hard Candy  4   Mix the entire bottle of Miralax with 64 oz. of Gatorade/Powerade in the morning and put in the refrigerator to chill.  5   At 3:00 pm take 2 Dulcolax/Bisacodyl tablets.  6   At 4:30 pm take one Reglan/Metoclopramide tablet.  7  Starting at 5:00 pm drink one 8 oz glass of the Miralax mixture every 15-20 minutes until you have finished drinking the entire 64 oz.  You should finish drinking prep around 7:30 or 8:00 pm.  8   If you are nauseated, you may take the 2nd Reglan/Metoclopramide tablet  at 6:30 pm.        9    At 8:00 pm take 2 more DULCOLAX/Bisacodyl tablets.     THE DAY OF YOUR PROCEDURE      DATE:  02/26/10 DAY: Tuesday  You may drink clear liquids until 12:00 pm  (2 HOURS BEFORE PROCEDURE).   MEDICATION INSTRUCTIONS  Unless otherwise instructed, you should take regular prescription medications with a small sip of water as early as possible the morning of your procedure.  Diabetic patients - see separate instructions.  You will be contaced by our office prior to your procedure for directions on holding your Coumadin/Warfarin.  If you do not hear from our office 1 week prior to your scheduled procedure, please call [phone] to discuss.         OTHER INSTRUCTIONS  You will need a responsible adult at least 75 years of age to accompany you and drive you home.   This person must remain in the waiting room during your procedure.  Wear loose fitting clothing that is easily removed.  Leave jewelry and other valuables at home.  However, you may wish to bring a book to read or an iPod/MP3 player to listen to music as you wait for your procedure to start.  Remove all body piercing jewelry and leave at home.  Total time from  sign-in until discharge is approximately 2-3 hours.  You should go home directly after your procedure and rest.  You can resume normal activities the day after your procedure.  The day of your procedure you should not:   Drive   Make legal decisions   Operate machinery   Drink alcohol   Return to work  You will receive specific instructions about eating, activities and medications before you leave.   The above instructions have been reviewed and explained to me by   _______________________    I fully understand and can verbalize these instructions _____________________________ Date _______

## 2010-03-14 NOTE — Procedures (Signed)
Summary: Colonoscopy   Patient Name: Jon Davis, Jon Davis MRN:  Procedure Procedures: Colonoscopy CPT: 65784.  Personnel: Endoscopist: Tag Wurtz L. Juanda Chance, MD.  Referred By: Talmadge Coventry, MD.  Exam Location: Exam performed in Outpatient Clinic. Outpatient  Patient Consent: Procedure, Alternatives, Risks and Benefits discussed, consent obtained, from patient. Consent was obtained by the RN.  Indications  Average Risk Screening Routine.  History  Pre-Exam Physical: Performed Oct 26, 2002. Cardio-pulmonary exam, Rectal exam, HEENT exam , Abdominal exam, Extremity exam, Neurological exam, Mental status exam WNL.  Exam Exam: Extent of exam reached: Cecum, extent intended: Cecum.  The cecum was identified by appendiceal orifice and IC valve. Colon retroflexion performed. Images taken. ASA Classification: II. Tolerance: good.  Monitoring: Pulse and BP monitoring, Oximetry used. Supplemental O2 given.  Colon Prep Used Visicol for colon prep. Prep results: good.  Sedation Meds: Patient assessed and found to be appropriate for moderate (conscious) sedation. Fentanyl 75 mcg. given IV. Versed 7 mg. given IV.  Findings NORMAL EXAM: Cecum. Comments: large smoothe ileocecal valve, lipomatous.  - DIVERTICULOSIS: Descending Colon to Sigmoid Colon. ICD9: Diverticulosis: 562.10. Comments: mild divertic.   Assessment Abnormal examination, see findings above.  Diagnoses: 562.10: Diverticulosis.   Comments: no polyps Events  Unplanned Interventions: No intervention was required.  Unplanned Events: There were no complications. Plans Patient Education: Patient given standard instructions for: Diverticulosis. Yearly hemoccult testing recommended. Patient instructed to get routine colonoscopy every 10 years.  Disposition: After procedure patient sent to recovery. After recovery patient sent home.    Patient Name: Jon Davis, Jon Davis MRN:  Plans Comments: resume ASA and  Plavix  Amendment 1 Printed and closed by Verlee Monte L. Juanda Chance, MD on 10/26/2002 at 11:20 AM   [Fellow]   cc: Harvie Heck, M.D.  This report was created from the original endoscopy report, which was reviewed and signed by the above listed endoscopist.

## 2010-03-14 NOTE — Letter (Signed)
Summary: Anticoagulation Response  Anticoag   Imported By: Lester Chalfont 02/07/2010 10:54:15  _____________________________________________________________________  External Attachment:    Type:   Image     Comment:   External Document

## 2010-03-14 NOTE — Letter (Signed)
Summary: [facility]  [facility]   Imported By: Lester Verona 02/14/2010 09:19:28  _____________________________________________________________________  External Attachment:    Type:   Image     Comment:   External Document

## 2010-03-14 NOTE — Procedures (Addendum)
Summary: Colonoscopy  Patient: Chia Rock Note: All result statuses are Final unless otherwise noted.  Tests: (1) Colonoscopy (COL)   COL Colonoscopy           DONE     [facility]     [address]     [place], [place]  [address]           COLONOSCOPY PROCEDURE REPORT           PATIENT:  Iran, Rowe  MR#:  [id number]     BIRTHDATE:  [birth year], 83 yrs. old  GENDER:  male     ENDOSCOPIST:  Hedwig Morton. Juanda Chance, MD     REF. BY:  Ace Gins, M.D.     PROCEDURE DATE:  02/26/2010     PROCEDURE:  Colonoscopy 27253     ASA CLASS:  Class III     INDICATIONS:  Iron deficiency anemia last colon 10/2002 showed     diverticulosis     MEDICATIONS:   Versed 2 mg, Fentanyl 50 mcg           DESCRIPTION OF PROCEDURE:   After the risks benefits and     alternatives of the procedure were thoroughly explained, informed     consent was obtained.  Digital rectal exam was performed and     revealed no rectal masses.   The [id number] endoscope was     introduced through the anus and advanced to the cecum, which was     identified by both the appendix and ileocecal valve, without     limitations.  The quality of the prep was good, using MiraLax.     The instrument was then slowly withdrawn as the colon was fully     examined.     <<PROCEDUREIMAGES>>     FINDINGS:  Moderate diverticulosis was found in the sigmoid colon     (see image4 and image5).  This was otherwise a normal examination     of the colon (see image6, image3, image2, and image1).     Retroflexed views in the rectum revealed no abnormalities.    The     scope was then withdrawn from the patient and the procedure     completed.           COMPLICATIONS:  None     ENDOSCOPIC IMPRESSION:     1) Moderate diverticulosis in the sigmoid colon     2) Otherwise normal examination     no evidence of GI blood loss     RECOMMENDATIONS:     see EGD recommendations     begin Iron supplements, FeSO4 325mg  po tid, #100, 1  refill     REPEAT EXAM:  In 0 year(s) for.           ______________________________     Hedwig Morton. Juanda Chance, MD           CC:  Ace Gins, M.D.           n.     eSIGNED:   Hedwig Morton. Frutoso Dimare at 02/26/2010 03:11 PM           Rosilyn Mings, [id number]  Note: An exclamation mark (!) indicates a result that was not dispersed into the flowsheet. Document Creation Date: 03/10/2010 11:34 AM _______________________________________________________________________  (1) Order result status: Final Collection or observation date-time: 02/26/2010 14:54 Requested date-time:  Receipt date-time:  Reported date-time:  Referring Physician:   Ordering Physician: Lina Sar [id number]) Specimen Source:  Source:  Launa Grill Order Number: [id number] Lab site:

## 2010-04-30 LAB — PROTIME-INR
INR: 1.31 (ref 0.00–1.49)
INR: 1.71 — ABNORMAL HIGH (ref 0.00–1.49)

## 2010-04-30 LAB — GLUCOSE, CAPILLARY: Glucose-Capillary: 103 mg/dL — ABNORMAL HIGH (ref 70–99)

## 2010-05-01 LAB — POCT I-STAT 3, ART BLOOD GAS (G3+)
Acid-Base Excess: 11 mmol/L — ABNORMAL HIGH (ref 0.0–2.0)
Bicarbonate: 40.3 mEq/L — ABNORMAL HIGH (ref 20.0–24.0)
pH, Arterial: 7.286 — ABNORMAL LOW (ref 7.350–7.450)
pO2, Arterial: 75 mmHg — ABNORMAL LOW (ref 80.0–100.0)

## 2010-05-01 LAB — URINALYSIS, ROUTINE W REFLEX MICROSCOPIC
Glucose, UA: NEGATIVE mg/dL
Protein, ur: NEGATIVE mg/dL
pH: 6 (ref 5.0–8.0)

## 2010-05-01 LAB — DIFFERENTIAL
Basophils Absolute: 0 10*3/uL (ref 0.0–0.1)
Lymphocytes Relative: 16 % (ref 12–46)
Monocytes Absolute: 1.1 10*3/uL — ABNORMAL HIGH (ref 0.1–1.0)
Monocytes Relative: 14 % — ABNORMAL HIGH (ref 3–12)
Neutro Abs: 5.1 10*3/uL (ref 1.7–7.7)
Neutrophils Relative %: 68 % (ref 43–77)

## 2010-05-01 LAB — CBC
HCT: 41.9 % (ref 39.0–52.0)
MCHC: 35 g/dL (ref 30.0–36.0)
MCV: 90.2 fL (ref 78.0–100.0)
Platelets: 229 10*3/uL (ref 150–400)
RDW: 13.1 % (ref 11.5–15.5)

## 2010-05-01 LAB — POCT CARDIAC MARKERS
CKMB, poc: <1 ng/mL — ABNORMAL LOW (ref 1.0–8.0)
Myoglobin, poc: 146 ng/mL (ref 12–200)
Troponin i, poc: <0.05 ng/mL (ref 0.00–0.09)

## 2010-05-01 LAB — COMPREHENSIVE METABOLIC PANEL
Albumin: 3.6 g/dL (ref 3.5–5.2)
BUN: 18 mg/dL (ref 6–23)
Calcium: 8.5 mg/dL (ref 8.4–10.5)
Creatinine, Ser: 1.29 mg/dL (ref 0.4–1.5)
Glucose, Bld: 118 mg/dL — ABNORMAL HIGH (ref 70–99)
Total Protein: 6.7 g/dL (ref 6.0–8.3)

## 2010-05-01 LAB — URINE CULTURE

## 2010-05-01 LAB — URINE MICROSCOPIC-ADD ON

## 2010-05-03 ENCOUNTER — Other Ambulatory Visit: Payer: Self-pay | Admitting: Internal Medicine

## 2010-05-03 DIAGNOSIS — D649 Anemia, unspecified: Secondary | ICD-10-CM

## 2010-05-03 NOTE — Telephone Encounter (Signed)
Please obtain CBC and Iron studies before refilling his Feso4. thx DB

## 2010-05-03 NOTE — Telephone Encounter (Signed)
Dr Jon Davis- Patient requests refills of iron. He was taking iron tid and was given #100 with 1 refills at his colonoscopy 02/26/10. Does he need to continue iron supplements?

## 2010-05-03 NOTE — Telephone Encounter (Signed)
Called patient to advise he will need labs for iron supplement. No answer. I will try again at a later time.

## 2010-05-05 LAB — GLUCOSE, CAPILLARY
Glucose-Capillary: 109 mg/dL — ABNORMAL HIGH (ref 70–99)
Glucose-Capillary: 113 mg/dL — ABNORMAL HIGH (ref 70–99)
Glucose-Capillary: 122 mg/dL — ABNORMAL HIGH (ref 70–99)
Glucose-Capillary: 192 mg/dL — ABNORMAL HIGH (ref 70–99)
Glucose-Capillary: 95 mg/dL (ref 70–99)

## 2010-05-05 LAB — BASIC METABOLIC PANEL
CO2: 27 mEq/L (ref 19–32)
Chloride: 103 mEq/L (ref 96–112)
GFR calc Af Amer: >60 mL/min (ref >60)
Sodium: 137 mEq/L (ref 135–145)

## 2010-05-05 LAB — CBC
HCT: 41.8 % (ref 39.0–52.0)
Hemoglobin: 14.5 g/dL (ref 13.0–17.0)
MCHC: 34.6 g/dL (ref 30.0–36.0)
MCV: 90.7 fL (ref 78.0–100.0)
Platelets: 243 10*3/uL (ref 150–400)
RBC: 4.61 MIL/uL (ref 4.22–5.81)
RDW: 13.3 % (ref 11.5–15.5)
WBC: 7.3 10*3/uL (ref 4.0–10.5)

## 2010-05-06 NOTE — Telephone Encounter (Signed)
Attempted to reach patient. Unable to contact. No voicemail. I will call back at a later time.

## 2010-05-07 NOTE — Telephone Encounter (Signed)
Spoke with patient and advised him that Dr Juanda Chance would like for him to have labs completed to check his iron levels before giving refills of iron. Patient verbalizes understanding and states that he will come on Friday around 10 am for labs. Advised patient we will call him back with results once they have been made available to [place].

## 2010-05-10 ENCOUNTER — Other Ambulatory Visit (INDEPENDENT_AMBULATORY_CARE_PROVIDER_SITE_OTHER): Payer: Medicare Other

## 2010-05-10 ENCOUNTER — Telehealth: Payer: Self-pay | Admitting: *Deleted

## 2010-05-10 DIAGNOSIS — D509 Iron deficiency anemia, unspecified: Secondary | ICD-10-CM

## 2010-05-10 DIAGNOSIS — D649 Anemia, unspecified: Secondary | ICD-10-CM

## 2010-05-10 LAB — IBC PANEL: Saturation Ratios: 34.3 % (ref 20.0–50.0)

## 2010-05-10 LAB — CBC WITH DIFFERENTIAL/PLATELET
Basophils Relative: 0.9 % (ref 0.0–3.0)
Eosinophils Absolute: 0.3 10*3/uL (ref 0.0–0.7)
Eosinophils Relative: 4.3 % (ref 0.0–5.0)
Hemoglobin: 13.6 g/dL (ref 13.0–17.0)
Lymphocytes Relative: 24.9 % (ref 12.0–46.0)
MCHC: 34.5 g/dL (ref 30.0–36.0)
MCV: 90.9 fl (ref 78.0–100.0)
Neutro Abs: 4.7 10*3/uL (ref 1.4–7.7)
RBC: 4.36 Mil/uL (ref 4.22–5.81)

## 2010-05-10 NOTE — Telephone Encounter (Signed)
[name] notified of lab results as per Dr Juanda Chance. Labs in EPIC. Note to me to remind patient.

## 2010-05-10 NOTE — Telephone Encounter (Signed)
Message copied by Jesse Fall on Fri May 10, 2010  2:15 PM ------      Message from: [place], [place]      Created: Fri May 10, 2010 11:59 AM       Please call pt with normal CBC and normal iron levels, repeat CBC in 3 months.

## 2010-06-25 NOTE — Cardiovascular Report (Signed)
NAMEMarland Davis  MALIKI, GIGNAC NO.:  [id number]   MEDICAL RECORD NO.:  [id number]          PATIENT TYPE:  INP   LOCATION:  3728                         FACILITY:  [facility]   PHYSICIAN:  Nicki Guadalajara, M.D.     DATE OF BIRTH:  [birth year]   DATE OF PROCEDURE:  DATE OF DISCHARGE:                            CARDIAC CATHETERIZATION   REQUESTING PHYSICIAN:  Madaline Savage, MD   INDICATIONS:  Mr. Jhalil Silvera is a 77 year old gentleman with a past  medical history notable for type 2 diabetes mellitus, hypertension, as  well as TIAs.  He apparently was hospitalized on Jul 05, 2007 by the  [facility].  He was seen in cardiology consultation by Dr.  Elsie Lincoln.  A head CT apparently was negative for bleed.  Initial CK was  1004, CK-MB 29.2, and troponin 0.10.  There are no diagnostic ST-T  changes.  Due to his abnormal enzymes with repeat enzymes at 8:36 with  an MB of 18.9, definitive catheterization was recommended.   PROCEDURE:  After premedication with Valium 4 mg intravenously, the  patient prepped and draped in a usual fashion.  Right femoral artery was  punctured anteriorly and a 5-French sheath was inserted without  difficulty.  Diagnostic catheterization was done utilizing 5-French  Judkins for left and right coronary catheters.  A 5-French pigtail  catheter was used for a biplane left ventriculography.  With the  patient's hypertensive history, distal aortography was done to visualize  the renal arteries to make sure there was not a renovascular etiology to  his hypertension.  Hemostasis was obtained by direct manual pressure.  The patient tolerated the procedure well.   HEMODYNAMIC DATA:  Central aortic pressure was 167/67.  Left ventricular  pressure was 167/9.  __________  of 15.   ANGIOGRAPHIC DATA:  Left main coronary artery was angiographically  normal and bifurcated into an LAD and left circumflex system.   The LAD had moderate calcification proximally.   The vessel gave rise to  2 major diagonal vessels.  Two major diagonal vessels of one prominent  proximal septal perforating artery.  The second diagonal vessel at 20%  ostial narrowing.  This vessel was of small caliber and bifurcated and  there appeared to be diffuse 80% narrowing in a small inferior limb of  this bifurcating small second diagonal vessel.  The mid-LAD had 20%  narrowing.   The circumflex vessel gave rise to a major marginal vessel and then had  60%-70% focal narrowing in the mid AV groove portion.  There is brisk  TIMI III flow beyond this.   The right coronary was moderate-sized vessel, that had 20%-30% luminal  irregularity in the mid segment.  The vessel supplied a PDA and  posterolateral vessel.   Biplane left ventriculography revealed normal LV contractility without  focal segmental wall motion abnormalities.  Distal aortography did not  demonstrate any renal artery stenosis.  There was mild irregularity of  the infrarenal aorta.  The iliac system was suboptimally visualized.   IMPRESSION:  1. Normal left ventricular function.  2. Mild-to-moderate coronary calcification with narrowing of 20%  in      the ostium of the second diagonal vessel with diffuse 80% stenosis      in a small-caliber inferior branch of this diagonal vessel with 20%      narrowing in the mid left anterior descending; 60%-70% focal      narrowing in the mid atrioventricular groove circumflex; and mild      20%-30% luminal irregularity of the right coronary artery.  3. No evidence for renal artery stenosis.  4. Normal left ventricular function.   RECOMMENDATIONS:  Medical therapy.           ______________________________  Nicki Guadalajara, M.D.     TK/MEDQ  D:  07/07/2007  T:  07/08/2007  Job:  [id number]   cc:   Talmadge Coventry, M.D.  Elliot Cousin, M.D.  Madaline Savage, M.D.

## 2010-06-25 NOTE — Discharge Summary (Signed)
NAMEMUAD, NOGA NO.:  [id number]   MEDICAL RECORD NO.:  [id number]          PATIENT TYPE:  INP   LOCATION:  3728                         FACILITY:  [facility]   PHYSICIAN:  Isidor Holts, M.D.  DATE OF BIRTH:  [birth year]   DATE OF ADMISSION:  07/05/2007  DATE OF DISCHARGE:  07/08/2007                               DISCHARGE SUMMARY   PRIMARY CARE PHYSICIAN:  Talmadge Coventry, M.D.   PRIMARY NEUROLOGIST:  Gustavus Messing. Orlin Hilding, M.D.   DISCHARGE DIAGNOSES:  1. Possible transient ischemic attack.  2. Status post fall, no bony injuries.  3. Dementia.  4. Coronary artery disease, status post cardiac catheterization Jul 07, 2007.  This showed 60% circumflex disease.  5. Hypertension.  6. Type 2 diabetes mellitus.  7. Abnormal liver function tests, negative hepatitis A, B, and C      serologies.   DISCHARGE MEDICATIONS:  1. Aspirin 81 mg p.o. daily.  2. Plavix 75 mg p.o. daily.  3. Norvasc 5 mg p.o. daily.  4. Lopressor 25 mg p.o. b.i.d.  5. Imdur 30 mg p.o. daily.  6. Xanax 0.25 mg p.o. b.i.d. (was on 0.25 mg t.i.d.).  7. Vicodin 5/500 one p.o. p.r.n. q.6h.  8. Aricept 5 mg p.o. daily.   Note: Metformin, Enablex and Darvocet N100 have been discontinued, until  reviewed by primary MD.   PROCEDURES:  1. Head CT scan dated Jul 05, 2007.  This showed no acute intracranial      findings.  There was stable cerebral atrophy and chronic      microvascular white matter disease.  2. X-ray of the cervical spine dated Jul 05, 2007.  This showed      advanced cervical spondylosis, no interval neck, no acute fracture.  3. Chest x-ray dated Jul 05, 2007.  This showed right base      subsegmental atelectasis.  4. Brain MRI dated Jul 06, 2007.  This showed no acute intracranial      abnormality, stable advanced cerebral white matter disease,      nonspecific but most commonly related to chronic small vessel      ischemia.  5. MRA of the neck dated Jul 06, 2007.  This showed no evidence of      carotid artery stenosis, tortuous distal left ICA, tortuous      nondominant left vertebral artery, no evidence of vertebral artery      stenosis.  6. Head MRA dated Jul 06, 2007.  This showed stable intracranial MRA      without focal stenosis or occlusion identified.  7. 2-D echocardiogram dated Jul 06, 2007.  This showed normal LV and      RV size and systolic function.  Normal LA and RA size.  Mild TR.      Trace MR, AI, and PR.  RVSP is mildly elevated, consistent with      mild pulmonary hypertension.  No prior study available for      comparison at this time.  8. Cardiac catheterization done Jul 07, 2007.  This showed 60%  circumflex.   CONSULTATIONS:  Cristy Hilts. Jacinto Halim, M.D., cardiologist, [facility].   ADMISSION HISTORY:  As in H&P notes of Jul 05, 2007 dictated by Dr.  Elliot Cousin. However, in brief, this is a 77 year old male, with known  history of previous TIAs, type 2 diabetes mellitus with neuropathy,  hypertension, chronic back pain, multilevel cervical spondylosis and  foraminal stenosis, overactive bladder, early Alzheimer's disease,  Jehovah's Witness, who presents with altered mental status described as  confusion, difficulty in arousing, difficulty talking.  Also, status  post fall, precise circumstances unclear.  On initial evaluation in the  emergency department, he had no focal neurologic deficits.  Urine drug  screen was positive for benzodiazepines and opiates.  Liver  transaminases were elevated with AST of 285, ALT of 205.  He was  admitted for further evaluation, investigation, and management.   HOSPITAL COURSE:  Problem 1.  Altered mental status.  This may have been  multifactorial.  The patient showed no hypoglycemia on blood glucose  testing.  Urine drug screen demonstrated benzodiazepines and opiates.  He received a single dose of Narcan in the emergency department.  Septic  workup  was unrevealing, including chest x-ray and urinalysis.  It is  likely that the main culprits were opiate medications and  benzodiazepines, against the background of dementia.  Be that as it may,  he remained during the course of his hospitalization with no focal  neurologic deficits, although he seemed intermittently confused.   Problem 2.  Possible TIA.  As mentioned above, the patient had altered  mental status, confusion, and slurred speech; however, on physical  examination he was found to have no focal neurologic deficits.  TIA/CVA  workup was carried out.  For details of findings, refer to procedure  list above; however, no concerning acute abnormalities were found.  The  patient is already on a risk factor medication with antiplatelet  medications.  His lipid profile was excellent, with a total cholesterol  of 119, triglycerides 90, HDL 38, LDL 63.  In view of the patient's  abnormal LFTs, i.e., see below, statin has not been commenced at the  present time, although this is subject to review by the patient's  primary M.D.   Problem 3.  Abnormal LFTs.  As mentioned above, the patient's LFTs were  appreciably abnormal at the time of initial presentation, with an AST of  285 and ALT of 225.  Alcohol level was negative.  Hepatitis A, B, and C  serologies were negative.  We monitored the patient's LFTs over the  course of his hospitalization against the background of intravenous  fluid hydration and vitamin supplementation, and we are pleased to note  that as of Jul 07, 2007, AST was 75, ALT 97, alkaline phosphatase 82.  Further improvement is anticipated.   Problem 4.  Type 2 diabetes mellitus.  The patient's oral hyperglycemic  medications were discontinued at the time of presentation.  He remained  euglycemic during the course of his hospitalization and a carbohydrate  modified diet.  As mentioned above, the patient does indeed have  abnormal LFTs; therefore, we have not  reinstituted his Metformin. We  will defer monitoring of the patient's glycemia to primary M.D. and  reinstitution of oral hyperglycemic medications, as indicated.   Problem 5.  Multilevel degenerative joint disease/ chronic back pain.  This was not problematic during the course of the patient's  hospitalization.   Problem 6.  Hypertension.  The patient  was managed with a combination of  beta blockers and calcium channel antagonist and remained normotensive  throughout his hospitalization.   Problem 7.  Coronary artery disease.  The patient, at the time of  presentation, was found to have elevated CPK.  This was likely secondary  to his fall.  His troponin I, was not elevated.  However, the patient  does indeed have risk factors for coronary artery disease and therefore  it was felt appropriate to involve cardiologist.  Consultation was  kindly provided by Dr. Yates Decamp who recommended adding Imdur to the  patient's current treatment.  The patient underwent coronary angiogram  on Jul 07, 2007 which showed 60% circumflex.  Per cardiologist, no other  active intervention or workup was indicated at the present time.  The  patient's 2-D echocardiogram showed good LV function.   Problem 8.  Dementia.  As mentioned above, the patient has been  intermittently confused during the course of his hospitalization.  Per  my discussion with his son, Mr. Elmer Sow, telephone number [phone], on Jul 08, 2007, it appears that he had been gradually becoming  more confused; however, he appeared acutely so, during the course of  this hospitalization.  This may be multifactorial, including being in a  strange environment as well as having acute medical problems which  apparently have now resolved.  We have, however, commenced the patient  on Aricept at an initial dose of 5 mg p.o. nightly.  This may need to be  increased in due course.  I defer this to the patient's primary M.D.   DISPOSITION:   The patient was on Jul 08, 2007 considered clinically  stable to be discharged.  He has therefore been discharged accordingly.   DIET:  Heart healthy, carbohydrate-modified.   ACTIVITY:  As tolerated.   FOLLOW UP:  The patient is recommended to follow up with his primary  M.D., Dr. Talmadge Coventry, in the coming week.  His family has been  instructed to call for an appointment.  He is also to followup routinely  with his primary neurologist, Dr. Marcelino Freestone, per prior scheduled  appointment.      Isidor Holts, M.D.  Electronically Signed     CO/MEDQ  D:  07/08/2007  T:  07/08/2007  Job:  [id number]   cc:   Talmadge Coventry, M.D.  Catherine A. Orlin Hilding, M.D.

## 2010-06-25 NOTE — H&P (Signed)
NAMETRENELL, CONCANNON NO.:  [id number]   MEDICAL RECORD NO.:  [id number]          PATIENT TYPE:  INP   LOCATION:  1829                         FACILITY:  [facility]   PHYSICIAN:  Elliot Cousin, M.D.    DATE OF BIRTH:  Sep 21, 1926   DATE OF ADMISSION:  07/05/2007  DATE OF DISCHARGE:                              HISTORY & PHYSICAL   PRIMARY CARE PHYSICIAN:  Talmadge Coventry, M.D.   PRIMARY NEUROLOGIST:  Gustavus Messing. Orlin Hilding, M.D.   CHIEF COMPLAINT:  Confusion, difficult to arouse, difficulty talking.   HISTORY OF PRESENT ILLNESS:  The patient is a 77 year old man with a  past medical history significant for TIAs, type 2 diabetes mellitus,  hypertension, and chronic back pain.  He presented to the emergency  department this afternoon after his wife found him down at home after he  attempted to get out of bed.  The patient is currently confused.  However, he is mostly oriented to himself, his friends, and the  hospital.  He does not recall what happened this morning and why he is  in the emergency department.  Upon further questioning, he says, I  believe I had a stroke.  At this point, no CT scan of the head or MRI  of the brain has been performed.  The history is provided in part by the  patient's wife.  Accordingly, the patient was apparently still in bed at  approximately 11:30 a.m. this morning.  He usually wakes up at 6:30 a.m.  each morning.  When Mrs. Trainer tried to awaken the patient, it  appeared that he was trying to talk but could not.  She left the room to  call EMS, and, when she came back into the bedroom, the patient was on  the floor.  It did not appear that he hit his head.  Again, he tried to  speak but could not, according to Mrs. Morocho.  She did not notice any  focal unilateral weakness, facial droop, or incontinence of bladder or  bowels.  The patient, however, could not get up without any assistance.  The patient is a diabetic, and she  thought that his blood sugar may have  been low; however, she did not check it.  When EMS arrived, they did  check his capillary blood glucose; however, the report is not available.  Apparently, he did not have hypoglycemia.  When EMS evaluated the  patient, apparently he had pinpoint pupils.  There was concern about the  patient taking some of his wife's medication; namely, Ambien.  According  to Mrs. Brosch, the patient cannot tolerate Ambien because it makes him  too confused.  The patient is chronically treated with hydrocodone and  Xanax.   During the evaluation in the emergency department, the patient is  hemodynamically stable and afebrile.  However, his blood pressure is  moderately elevated.  The emergency department physician evaluated the  patient.  A nurine drug screen was ordered, and it was positive for  opiates and benzodiazepines.  He was given Narcan, and apparently he  became much more alert.  Currently,  the patient has no complaints of  headache, chest pain or shortness of breath.  He denies any difficulty  swallowing, visual changes, or unilateral weakness.  A CT scan of the  head has not been ordered yet.  His lab data are remarkable for a WBC of  12.8, urine drug screen that is positive for benzodiazepines and  opiates, elevated liver transaminases with an SGOT of 285 and SGPT of  225, and a negative alcohol level.  The patient will, therefore, be  admitted for further evaluation and management.   PAST MEDICAL HISTORY.:  1. History of TIAs and diffuse white matter cerebrovascular disease      per previous CT scan of the head.  2. Type 2 diabetes mellitus with neuropathy.  3. Hypertension.  4. Chronic back pain.  5. Multilevel cervical spondylosis and foraminal stenosis per MRI of      the cervical spine in January 2008.  6. Overactive bladder.  7. Question early Alzheimer's disease versus vascular dementia.  8. Jehovah's Witness.   MEDICATIONS:  1. Plavix  75 mg daily.  2. Aspirin 81 mg daily.  3. Unsure of blood pressure medication, if any  4. Alprazolam 0.25 mg 3 times a day.  5. Hydrocodone/APAP 5/500 mg 1-2 tablets every 4 hours p.r.n.  6. Enablex 15 mg daily.  (Not sure if he is taking.)  7. Glumetza  500 mg daily.  8. Darvocet-N 100 one to two tablets 4 times a day p.r.n. (Not sure if      he is taking.)   ALLERGIES:  The patient has no known drug allergies.  However, he is  intolerant to [facility] which apparently causes severe confusion and  hallucinations.   SOCIAL HISTORY:  The patient is married.  He is retired from [employer].  He is also a retired Production designer, theatre/television/film.  He lives in [place] with  his wife.  He has two children.  He stopped smoking cigarettes more than  30 years ago.  He denies alcohol and illicit drug use.   FAMILY HISTORY:  His father died of a heart attack.  His mother is  deceased, etiology unknown.   REVIEW OF SYSTEMS:  The patient's Review of Systems is positive for  worsening memory, chronic upper and lower back pain, radiation of pain  in his groin bilaterally, occasional numbness and tingling in the feet.  Otherwise, Review of Systems is negative.   PHYSICAL EXAMINATION:  VITAL SIGNS:  Temperature 99.9, blood pressure  179/87, pulse 109, respiratory rate 18, oxygen saturation 93% on room  air (apparently recorded at 82% initially)  GENERAL:  The patient is an alert, elderly, 77 year old Caucasian man  who is currently sitting up in bed in no acute distress.  HEENT:  Head is normocephalic.  No evidence of bumps, bruises or  irregularities.  Pupils equal, round, reactive to light.  Extraocular  movements are intact.  Conjunctivae are clear.  Sclerae are white.  Tympanic membranes are mildly obscured by cerumen bilaterally, otherwise  no acute changes.  Nasal mucosa is dry.  No sinus tenderness.  Oropharynx reveals fair dentition.  Mucous membranes are mildly dry.  No  posterior exudates or erythema.   NECK:  Supple.  Mild posterior tenderness without any evidence of  effusions, ecchymosis, or edema.  Supple.  No thyromegaly, no bruit, no  JVD.  LUNGS:  Decreased breath sounds in the bases, otherwise clear.  HEART:  S1-S2 with a soft systolic murmur.  ABDOMEN:  Mildly obese. Positive  bowel sounds, soft, nontender,  nondistended.  No hepatosplenomegaly, no masses palpated.  Bilateral  inguinal region without tenderness and without obvious masses.  GU AND RECTAL:  Otherwise deferred.  EXTREMITIES:  Pedal pulses 2+ bilaterally.  No pretibial edema and no  pedal edema.  NEUROLOGIC:  The patient is alert and oriented to himself and the  hospital.  He could not remember the name of one of his friends,  although he was able to remember the name of the other friend.  He  believes that the year was 85.  He thought that the month was  February.  Cranial nerves II-XII are grossly intact.  The patient  follows commands well.  He did have a mild resting tremor bilaterally  and a mild intention tremor with the cerebellar exam.  Strength grossly  is 5/5 upper and lower extremities symmetrically.  Sensation is grossly  intact.  Gait not assessed.  .   ADMISSION LABORATORY DATA:  CT scan of the head, cervical x-ray, and  chest x-ray are pending.   EKG reveals normal sinus rhythm with a heart rate of 100 beats per  minute.   Salicylate level less than 4.  Alcohol less than 5. Sodium 138,  potassium 4.6, chloride 103, CO2 28, glucose 159, BUN 17, creatinine  1.48, total bilirubin 0.6, alkaline phosphatase 115, SGOT 285, SGPT 225,  total protein 6.6, albumin 3.6, calcium 9.3.  Acetaminophen less than  10. Urine drug screen positive for benzodiazepines and opiates. WBC  12.8, hemoglobin 13.2, platelets 295.  Urinalysis essentially negative.   ASSESSMENT:  1. Decrease in level of consciousness, altered mental status,      transient aphasia; also status post fall.  The patient appears to      not  have any significant musculoskeletal pain and no apparent head      trauma.  The etiology of his symptomatology is unclear at this      time.  However, we will consider an acute stroke, transient      ischemic attack, and unintentional drug toxicity.  Would also      consider hypoglycemia; however, the patient's venous glucose is      actually a little elevated here in the emergency department.      Currently, the patient is more alert and somewhat oriented, and      there does not appear to be any focal neurological deficits similar      to what was apparently reported by his wife.  His urine drug screen      is positive for opiates and benzodiazepines; however, he is      chronically treated with hydrocodone/APAP and Xanax.  2. Elevated liver transaminases and hepatic transaminitis.  The      patient is nontender with regards to the abdominal exam.  No      obvious hepatosplenomegaly.  His SGOT is 285 and SGPT is 225.  The      etiology is unclear.  In review of his medications, the patient is      not being treated with a statin.  His acetaminophen level is less      than 10.  The etiology of the elevated liver transaminases is      unclear at this time.  3. Type 2 diabetes mellitus with a history of neuropathy.  The patient      is chronically treated with Glumetza.  His venous glucose is      currently 159.  4. Hypertension.  Neither his wife nor the patient recalls      antihypertensive medication therapy.  His blood pressure is      currently 179/87.  5. Leukocytosis.  The patient's WBC is 12.8.  His urinalysis is      unremarkable.  A chest x-ray will be ordered to rule out any acute      cardiopulmonary process.  6. Possible early Alzheimer's disease versus vascular dementia.  Mrs.      Celaya notes that the patient's memory has been worsening over the      past few months.  7. The patient is a Scientist, product/process development.   PLAN:  1. The patient will be admitted for further  evaluation and management.  2. For further evaluation, will check a stat noncontrasted CT scan of      the head to rule out a bleed.  We will also assess the patient's      cervical spine to rule out fracture given his recent fall. If there      are no signs of bleeding, aspirin and Plavix will be restarted.  3. An MRI and MRA of the brain will be ordered to rule out an acute      stroke.  4. Further evaluation will continue with a 2-D echocardiogram, carotid      Dopplers, vitamin B12, cardiac enzymes, RPR, and TSH.  5. Will decrease the dose of Tylenol and use Tylenol exclusively for      pain over the next 24 hours.  We will also decrease the dosing of      the Xanax.  6. We will check an ultrasound of the abdomen and a viral hepatitis      panel and other labs to assess the elevated      liver transaminases.  7. Will ask the nursing staff to perform a bedside swallow evaluation.      Will consult the speech therapist, occupational therapist, and      physical therapist.      Elliot Cousin, M.D.  Electronically Signed     DF/MEDQ  D:  07/05/2007  T:  07/05/2007  Job:  [id number]   cc:   Talmadge Coventry, M.D.  Catherine A. Orlin Hilding, M.D.

## 2010-07-08 ENCOUNTER — Other Ambulatory Visit: Payer: Self-pay | Admitting: Internal Medicine

## 2010-07-09 ENCOUNTER — Telehealth: Payer: Self-pay | Admitting: *Deleted

## 2010-07-09 NOTE — Telephone Encounter (Signed)
1 refill given. Patient to have repeat labs in 1 more month as per Dr Regino Schultze previous note on CBC.

## 2010-07-09 NOTE — Telephone Encounter (Signed)
Message copied by Richardson Chiquito on Tue Jul 09, 2010 10:03 AM ------      Message from: Hart Carwin      Created: Tue Jul 09, 2010  9:48 AM      Regarding: iron refill       One more refill of iron, please, then check CBC, Iron levels      ----- Message -----         From: Vernia Buff, CMA         Sent: 07/09/2010   8:18 AM           To: Hart Carwin, MD            Pt recently had a normal cbc. He is requesting refills on iron. Do you want me to refill or see if his levels stay up without iron supplementation?

## 2010-07-23 ENCOUNTER — Telehealth: Payer: Self-pay | Admitting: Internal Medicine

## 2010-07-23 NOTE — Telephone Encounter (Signed)
Please stop Iron and recheck CBC as scheduled

## 2010-07-23 NOTE — Telephone Encounter (Signed)
Please stop Iron, and repeat CBC as scheduled

## 2010-07-23 NOTE — Telephone Encounter (Signed)
[name] calling to see if he needs to stay on iron tabs TID since his last labs on 05/10/10 were: Hgb 13.6, Hct. 39.6, Iron 118, Transferrin 245.8, Saturation ratio 34.3. Patient is scheduled for repeat CBC in 3 months. Please, advise.

## 2010-07-24 NOTE — Telephone Encounter (Signed)
[name] given Dr. Regino Schultze recommendation.

## 2010-07-26 ENCOUNTER — Other Ambulatory Visit: Payer: Self-pay | Admitting: Internal Medicine

## 2010-08-09 ENCOUNTER — Other Ambulatory Visit (INDEPENDENT_AMBULATORY_CARE_PROVIDER_SITE_OTHER): Payer: Medicare Other

## 2010-08-09 DIAGNOSIS — D509 Iron deficiency anemia, unspecified: Secondary | ICD-10-CM

## 2010-08-09 LAB — CBC WITH DIFFERENTIAL/PLATELET
Basophils Absolute: 0.1 10*3/uL (ref 0.0–0.1)
Basophils Relative: 1.7 % (ref 0.0–3.0)
Eosinophils Relative: 5 % (ref 0.0–5.0)
HCT: 38.9 % — ABNORMAL LOW (ref 39.0–52.0)
Hemoglobin: 13.4 g/dL (ref 13.0–17.0)
Lymphs Abs: 2 10*3/uL (ref 0.7–4.0)
Monocytes Relative: 12 % (ref 3.0–12.0)
Neutro Abs: 4.3 10*3/uL (ref 1.4–7.7)
RBC: 4.19 Mil/uL — ABNORMAL LOW (ref 4.22–5.81)
RDW: 12.8 % (ref 11.5–14.6)

## 2010-08-12 ENCOUNTER — Telehealth: Payer: Self-pay | Admitting: *Deleted

## 2010-08-12 DIAGNOSIS — D649 Anemia, unspecified: Secondary | ICD-10-CM

## 2010-08-12 NOTE — Telephone Encounter (Signed)
Results given to patient's wife. Labs in [facility] for 11/12/10. Note to remind patient.

## 2010-08-12 NOTE — Telephone Encounter (Signed)
Message copied by Daphine Deutscher on Mon Aug 12, 2010  1:43 PM ------      Message from: Hart Carwin      Created: Sun Aug 11, 2010  9:16 PM       Please call pt with normal CBC, repeat CBC in 3 months

## 2010-09-11 ENCOUNTER — Other Ambulatory Visit: Payer: Self-pay | Admitting: Internal Medicine

## 2010-10-01 HISTORY — PX: NM MYOVIEW LTD: HXRAD82

## 2010-11-06 LAB — ETHANOL: Alcohol, Ethyl (B): <5

## 2010-11-06 LAB — COMPREHENSIVE METABOLIC PANEL
ALT: 97 — ABNORMAL HIGH
AST: 285 — ABNORMAL HIGH
Albumin: 3 — ABNORMAL LOW
Albumin: 3.6
Alkaline Phosphatase: 115
Alkaline Phosphatase: 82
Alkaline Phosphatase: 90
BUN: 17
BUN: 17
BUN: 17
CO2: 28
CO2: 29
Chloride: 100
Chloride: 102
Chloride: 103
Creatinine, Ser: 1.17
Creatinine, Ser: 1.48
GFR calc non Af Amer: 46 — ABNORMAL LOW
GFR calc non Af Amer: >60
Glucose, Bld: 104 — ABNORMAL HIGH
Glucose, Bld: 121 — ABNORMAL HIGH
Potassium: 3.9
Potassium: 4.1
Potassium: 4.6
Sodium: 139
Total Bilirubin: 0.4
Total Bilirubin: 0.5
Total Bilirubin: 0.6
Total Protein: 6.1

## 2010-11-06 LAB — BASIC METABOLIC PANEL
CO2: 28
Calcium: 8.8
Calcium: 9.4
Chloride: 101
Creatinine, Ser: 1.08
GFR calc Af Amer: >60
GFR calc Af Amer: >60
GFR calc non Af Amer: >60
Glucose, Bld: 111 — ABNORMAL HIGH
Potassium: 3.7
Sodium: 136

## 2010-11-06 LAB — URINE CULTURE: Colony Count: >=100000

## 2010-11-06 LAB — DIFFERENTIAL
Basophils Absolute: 0
Basophils Relative: 0
Eosinophils Relative: 1
Lymphocytes Relative: 15
Lymphocytes Relative: 9 — ABNORMAL LOW
Lymphs Abs: 1.2
Monocytes Absolute: 0.8
Monocytes Absolute: 1.3 — ABNORMAL HIGH
Monocytes Relative: 11
Neutro Abs: 10.3 — ABNORMAL HIGH
Neutro Abs: 5.6

## 2010-11-06 LAB — CARDIAC PANEL(CRET KIN+CKTOT+MB+TROPI)
CK, MB: 10.1 — ABNORMAL HIGH
CK, MB: 18.9 — ABNORMAL HIGH
Total CK: 605 — ABNORMAL HIGH
Total CK: 836 — ABNORMAL HIGH
Troponin I: 0.04
Troponin I: 0.06

## 2010-11-06 LAB — CBC
HCT: 31.9 — ABNORMAL LOW
HCT: 33.5 — ABNORMAL LOW
HCT: 34.5 — ABNORMAL LOW
HCT: 39.3
Hemoglobin: 11.2 — ABNORMAL LOW
Hemoglobin: 11.4 — ABNORMAL LOW
Hemoglobin: 11.7 — ABNORMAL LOW
Hemoglobin: 12.5 — ABNORMAL LOW
MCV: 90.8
MCV: 91.3
MCV: 91.7
Platelets: 241
Platelets: 295
RBC: 3.5 — ABNORMAL LOW
RBC: 3.77 — ABNORMAL LOW
RBC: 4.02 — ABNORMAL LOW
RBC: 4.32
RDW: 13.1
WBC: 12.8 — ABNORMAL HIGH
WBC: 8.1
WBC: 8.1
WBC: 8.7

## 2010-11-06 LAB — LIPID PANEL
HDL: 38 — ABNORMAL LOW
LDL Cholesterol: 63
Triglycerides: 90
VLDL: 18

## 2010-11-06 LAB — CK TOTAL AND CKMB (NOT AT ARMC)
CK, MB: 29.2 — ABNORMAL HIGH
Total CK: 1004 — ABNORMAL HIGH

## 2010-11-06 LAB — ANA: Anti Nuclear Antibody(ANA): NEGATIVE

## 2010-11-06 LAB — URINALYSIS, ROUTINE W REFLEX MICROSCOPIC
Bilirubin Urine: NEGATIVE
Glucose, UA: NEGATIVE
Hgb urine dipstick: NEGATIVE
Ketones, ur: NEGATIVE
Specific Gravity, Urine: 1.017
pH: 5.5

## 2010-11-06 LAB — HEPATITIS PANEL, ACUTE
Hep A IgM: NEGATIVE
Hep B C IgM: NEGATIVE
Hep B C IgM: NEGATIVE
Hepatitis B Surface Ag: NEGATIVE

## 2010-11-06 LAB — HEMOGLOBIN A1C
Hgb A1c MFr Bld: 6.2 — ABNORMAL HIGH
Mean Plasma Glucose: 143

## 2010-11-06 LAB — RAPID URINE DRUG SCREEN, HOSP PERFORMED
Barbiturates: NOT DETECTED
Opiates: POSITIVE — AB

## 2010-11-06 LAB — PROTIME-INR
INR: 1
Prothrombin Time: 13.5

## 2010-11-06 LAB — VITAMIN B12: Vitamin B-12: 660 (ref 211–911)

## 2010-11-08 ENCOUNTER — Telehealth: Payer: Self-pay | Admitting: *Deleted

## 2010-11-08 NOTE — Telephone Encounter (Signed)
Message copied by Daphine Deutscher on Fri Nov 08, 2010  8:32 AM ------      Message from: Daphine Deutscher      Created: Mon Aug 12, 2010  1:47 PM       Call and remind patient CBC is due 11/12/10(DB)

## 2010-11-08 NOTE — Telephone Encounter (Signed)
Spoke with patient's wife and reminded her patient is due for CBC on 11/12/10

## 2010-11-12 ENCOUNTER — Other Ambulatory Visit (INDEPENDENT_AMBULATORY_CARE_PROVIDER_SITE_OTHER): Payer: Medicare Other

## 2010-11-12 DIAGNOSIS — D649 Anemia, unspecified: Secondary | ICD-10-CM

## 2010-11-12 LAB — CBC WITH DIFFERENTIAL/PLATELET
Basophils Absolute: 0.1 10*3/uL (ref 0.0–0.1)
Eosinophils Absolute: 0.3 10*3/uL (ref 0.0–0.7)
HCT: 38.8 % — ABNORMAL LOW (ref 39.0–52.0)
Hemoglobin: 13 g/dL (ref 13.0–17.0)
Lymphs Abs: 2.1 10*3/uL (ref 0.7–4.0)
MCHC: 33.6 g/dL (ref 30.0–36.0)
MCV: 92.1 fl (ref 78.0–100.0)
Monocytes Absolute: 0.9 10*3/uL (ref 0.1–1.0)
Monocytes Relative: 10.3 % (ref 3.0–12.0)
Neutro Abs: 5.1 10*3/uL (ref 1.4–7.7)
RDW: 13.6 % (ref 11.5–14.6)

## 2010-11-13 ENCOUNTER — Telehealth: Payer: Self-pay | Admitting: *Deleted

## 2010-11-13 NOTE — Telephone Encounter (Signed)
Message copied by Daphine Deutscher on Wed Nov 13, 2010  8:41 AM ------      Message from: Hart Carwin      Created: Tue Nov 12, 2010 11:08 PM       Please call pt with normal results

## 2010-11-13 NOTE — Telephone Encounter (Signed)
Spoke with patient and gave him the results as per Dr. Juanda Chance

## 2010-11-22 LAB — I-STAT 8, (EC8 V) (CONVERTED LAB)
Acid-Base Excess: 2
Chloride: 105
HCT: 39
Operator id: 146091
Potassium: 3.8
pH, Ven: 7.388 — ABNORMAL HIGH

## 2010-11-22 LAB — POCT CARDIAC MARKERS: Myoglobin, poc: 82.9

## 2010-11-22 LAB — CBC
HCT: 38.3 — ABNORMAL LOW
Platelets: 282
RDW: 13.7

## 2010-11-22 LAB — POCT I-STAT CREATININE: Creatinine, Ser: 1.3

## 2011-02-24 DIAGNOSIS — Z7901 Long term (current) use of anticoagulants: Secondary | ICD-10-CM | POA: Diagnosis not present

## 2011-02-24 DIAGNOSIS — I4891 Unspecified atrial fibrillation: Secondary | ICD-10-CM | POA: Diagnosis not present

## 2011-03-10 DIAGNOSIS — N3941 Urge incontinence: Secondary | ICD-10-CM | POA: Diagnosis not present

## 2011-03-17 ENCOUNTER — Other Ambulatory Visit: Payer: Self-pay | Admitting: Internal Medicine

## 2011-03-22 DIAGNOSIS — I472 Ventricular tachycardia: Secondary | ICD-10-CM | POA: Diagnosis not present

## 2011-03-22 DIAGNOSIS — I495 Sick sinus syndrome: Secondary | ICD-10-CM | POA: Diagnosis not present

## 2011-03-22 DIAGNOSIS — I4891 Unspecified atrial fibrillation: Secondary | ICD-10-CM | POA: Diagnosis not present

## 2011-03-24 DIAGNOSIS — I4891 Unspecified atrial fibrillation: Secondary | ICD-10-CM | POA: Diagnosis not present

## 2011-03-24 DIAGNOSIS — Z7901 Long term (current) use of anticoagulants: Secondary | ICD-10-CM | POA: Diagnosis not present

## 2011-04-07 DIAGNOSIS — I4891 Unspecified atrial fibrillation: Secondary | ICD-10-CM | POA: Diagnosis not present

## 2011-04-07 DIAGNOSIS — Z7901 Long term (current) use of anticoagulants: Secondary | ICD-10-CM | POA: Diagnosis not present

## 2011-04-21 DIAGNOSIS — I4891 Unspecified atrial fibrillation: Secondary | ICD-10-CM | POA: Diagnosis not present

## 2011-04-21 DIAGNOSIS — Z7901 Long term (current) use of anticoagulants: Secondary | ICD-10-CM | POA: Diagnosis not present

## 2011-05-05 DIAGNOSIS — IMO0001 Reserved for inherently not codable concepts without codable children: Secondary | ICD-10-CM | POA: Diagnosis not present

## 2011-05-05 DIAGNOSIS — M1990 Unspecified osteoarthritis, unspecified site: Secondary | ICD-10-CM | POA: Diagnosis not present

## 2011-05-26 DIAGNOSIS — I4891 Unspecified atrial fibrillation: Secondary | ICD-10-CM | POA: Diagnosis not present

## 2011-05-26 DIAGNOSIS — Z7901 Long term (current) use of anticoagulants: Secondary | ICD-10-CM | POA: Diagnosis not present

## 2011-06-23 DIAGNOSIS — I4891 Unspecified atrial fibrillation: Secondary | ICD-10-CM | POA: Diagnosis not present

## 2011-06-23 DIAGNOSIS — Z7901 Long term (current) use of anticoagulants: Secondary | ICD-10-CM | POA: Diagnosis not present

## 2011-06-26 DIAGNOSIS — I4891 Unspecified atrial fibrillation: Secondary | ICD-10-CM | POA: Diagnosis not present

## 2011-06-26 DIAGNOSIS — I472 Ventricular tachycardia: Secondary | ICD-10-CM | POA: Diagnosis not present

## 2011-06-26 DIAGNOSIS — I2510 Atherosclerotic heart disease of native coronary artery without angina pectoris: Secondary | ICD-10-CM | POA: Diagnosis not present

## 2011-07-08 DIAGNOSIS — Z7901 Long term (current) use of anticoagulants: Secondary | ICD-10-CM | POA: Diagnosis not present

## 2011-07-08 DIAGNOSIS — I4891 Unspecified atrial fibrillation: Secondary | ICD-10-CM | POA: Diagnosis not present

## 2011-07-31 DIAGNOSIS — E1159 Type 2 diabetes mellitus with other circulatory complications: Secondary | ICD-10-CM | POA: Diagnosis not present

## 2011-07-31 DIAGNOSIS — I2510 Atherosclerotic heart disease of native coronary artery without angina pectoris: Secondary | ICD-10-CM | POA: Diagnosis not present

## 2011-07-31 DIAGNOSIS — F329 Major depressive disorder, single episode, unspecified: Secondary | ICD-10-CM | POA: Diagnosis not present

## 2011-07-31 DIAGNOSIS — E785 Hyperlipidemia, unspecified: Secondary | ICD-10-CM | POA: Diagnosis not present

## 2011-08-07 DIAGNOSIS — Z7901 Long term (current) use of anticoagulants: Secondary | ICD-10-CM | POA: Diagnosis not present

## 2011-08-07 DIAGNOSIS — I4891 Unspecified atrial fibrillation: Secondary | ICD-10-CM | POA: Diagnosis not present

## 2011-08-21 DIAGNOSIS — I4891 Unspecified atrial fibrillation: Secondary | ICD-10-CM | POA: Diagnosis not present

## 2011-08-21 DIAGNOSIS — Z7901 Long term (current) use of anticoagulants: Secondary | ICD-10-CM | POA: Diagnosis not present

## 2011-08-22 DIAGNOSIS — N478 Other disorders of prepuce: Secondary | ICD-10-CM | POA: Diagnosis not present

## 2011-08-22 DIAGNOSIS — N390 Urinary tract infection, site not specified: Secondary | ICD-10-CM | POA: Diagnosis not present

## 2011-08-22 DIAGNOSIS — R350 Frequency of micturition: Secondary | ICD-10-CM | POA: Diagnosis not present

## 2011-09-05 DIAGNOSIS — G2581 Restless legs syndrome: Secondary | ICD-10-CM | POA: Diagnosis not present

## 2011-09-05 DIAGNOSIS — I6789 Other cerebrovascular disease: Secondary | ICD-10-CM | POA: Diagnosis not present

## 2011-09-05 DIAGNOSIS — R413 Other amnesia: Secondary | ICD-10-CM | POA: Diagnosis not present

## 2011-09-05 DIAGNOSIS — R259 Unspecified abnormal involuntary movements: Secondary | ICD-10-CM | POA: Diagnosis not present

## 2011-09-06 DIAGNOSIS — I4891 Unspecified atrial fibrillation: Secondary | ICD-10-CM | POA: Diagnosis not present

## 2011-09-06 DIAGNOSIS — I472 Ventricular tachycardia: Secondary | ICD-10-CM | POA: Diagnosis not present

## 2011-09-18 DIAGNOSIS — I4891 Unspecified atrial fibrillation: Secondary | ICD-10-CM | POA: Diagnosis not present

## 2011-09-18 DIAGNOSIS — Z7901 Long term (current) use of anticoagulants: Secondary | ICD-10-CM | POA: Diagnosis not present

## 2011-09-25 ENCOUNTER — Ambulatory Visit: Payer: Medicare Other | Admitting: Physical Therapy

## 2011-09-30 ENCOUNTER — Ambulatory Visit: Payer: Medicare Other | Attending: Neurology | Admitting: Physical Therapy

## 2011-09-30 DIAGNOSIS — R269 Unspecified abnormalities of gait and mobility: Secondary | ICD-10-CM | POA: Diagnosis not present

## 2011-09-30 DIAGNOSIS — Z9181 History of falling: Secondary | ICD-10-CM | POA: Diagnosis not present

## 2011-09-30 DIAGNOSIS — IMO0001 Reserved for inherently not codable concepts without codable children: Secondary | ICD-10-CM | POA: Insufficient documentation

## 2011-10-01 ENCOUNTER — Other Ambulatory Visit: Payer: Self-pay | Admitting: Internal Medicine

## 2011-10-08 ENCOUNTER — Ambulatory Visit: Payer: Medicare Other | Admitting: Physical Therapy

## 2011-10-08 DIAGNOSIS — R269 Unspecified abnormalities of gait and mobility: Secondary | ICD-10-CM | POA: Diagnosis not present

## 2011-10-08 DIAGNOSIS — IMO0001 Reserved for inherently not codable concepts without codable children: Secondary | ICD-10-CM | POA: Diagnosis not present

## 2011-10-08 DIAGNOSIS — Z9181 History of falling: Secondary | ICD-10-CM | POA: Diagnosis not present

## 2011-10-10 ENCOUNTER — Ambulatory Visit: Payer: Medicare Other | Admitting: *Deleted

## 2011-10-10 DIAGNOSIS — R269 Unspecified abnormalities of gait and mobility: Secondary | ICD-10-CM | POA: Diagnosis not present

## 2011-10-10 DIAGNOSIS — Z9181 History of falling: Secondary | ICD-10-CM | POA: Diagnosis not present

## 2011-10-10 DIAGNOSIS — IMO0001 Reserved for inherently not codable concepts without codable children: Secondary | ICD-10-CM | POA: Diagnosis not present

## 2011-10-15 ENCOUNTER — Ambulatory Visit: Payer: Medicare Other | Attending: Neurology | Admitting: *Deleted

## 2011-10-15 DIAGNOSIS — IMO0001 Reserved for inherently not codable concepts without codable children: Secondary | ICD-10-CM | POA: Insufficient documentation

## 2011-10-15 DIAGNOSIS — Z9181 History of falling: Secondary | ICD-10-CM | POA: Diagnosis not present

## 2011-10-15 DIAGNOSIS — R269 Unspecified abnormalities of gait and mobility: Secondary | ICD-10-CM | POA: Diagnosis not present

## 2011-10-16 DIAGNOSIS — Z7901 Long term (current) use of anticoagulants: Secondary | ICD-10-CM | POA: Diagnosis not present

## 2011-10-16 DIAGNOSIS — I4891 Unspecified atrial fibrillation: Secondary | ICD-10-CM | POA: Diagnosis not present

## 2011-10-17 ENCOUNTER — Ambulatory Visit: Payer: Medicare Other | Admitting: *Deleted

## 2011-10-20 ENCOUNTER — Ambulatory Visit: Payer: Medicare Other | Admitting: Physical Therapy

## 2011-10-22 ENCOUNTER — Ambulatory Visit: Payer: Medicare Other | Admitting: Physical Therapy

## 2011-10-27 ENCOUNTER — Ambulatory Visit: Payer: Medicare Other | Admitting: Physical Therapy

## 2011-10-29 ENCOUNTER — Ambulatory Visit: Payer: Medicare Other | Admitting: Physical Therapy

## 2011-10-30 DIAGNOSIS — L821 Other seborrheic keratosis: Secondary | ICD-10-CM | POA: Diagnosis not present

## 2011-10-30 DIAGNOSIS — Z85828 Personal history of other malignant neoplasm of skin: Secondary | ICD-10-CM | POA: Diagnosis not present

## 2011-10-30 DIAGNOSIS — I781 Nevus, non-neoplastic: Secondary | ICD-10-CM | POA: Diagnosis not present

## 2011-11-10 DIAGNOSIS — Z7901 Long term (current) use of anticoagulants: Secondary | ICD-10-CM | POA: Diagnosis not present

## 2011-11-10 DIAGNOSIS — I4891 Unspecified atrial fibrillation: Secondary | ICD-10-CM | POA: Diagnosis not present

## 2011-11-21 DIAGNOSIS — Z23 Encounter for immunization: Secondary | ICD-10-CM | POA: Diagnosis not present

## 2011-12-01 DIAGNOSIS — I10 Essential (primary) hypertension: Secondary | ICD-10-CM | POA: Diagnosis not present

## 2011-12-01 DIAGNOSIS — E785 Hyperlipidemia, unspecified: Secondary | ICD-10-CM | POA: Diagnosis not present

## 2011-12-01 DIAGNOSIS — E1159 Type 2 diabetes mellitus with other circulatory complications: Secondary | ICD-10-CM | POA: Diagnosis not present

## 2011-12-01 DIAGNOSIS — I4891 Unspecified atrial fibrillation: Secondary | ICD-10-CM | POA: Diagnosis not present

## 2011-12-01 DIAGNOSIS — Z1331 Encounter for screening for depression: Secondary | ICD-10-CM | POA: Diagnosis not present

## 2011-12-01 DIAGNOSIS — I2510 Atherosclerotic heart disease of native coronary artery without angina pectoris: Secondary | ICD-10-CM | POA: Diagnosis not present

## 2011-12-11 DIAGNOSIS — I4891 Unspecified atrial fibrillation: Secondary | ICD-10-CM | POA: Diagnosis not present

## 2011-12-11 DIAGNOSIS — Z7901 Long term (current) use of anticoagulants: Secondary | ICD-10-CM | POA: Diagnosis not present

## 2011-12-30 DIAGNOSIS — I4891 Unspecified atrial fibrillation: Secondary | ICD-10-CM | POA: Diagnosis not present

## 2011-12-30 DIAGNOSIS — Z7901 Long term (current) use of anticoagulants: Secondary | ICD-10-CM | POA: Diagnosis not present

## 2012-01-27 DIAGNOSIS — I4891 Unspecified atrial fibrillation: Secondary | ICD-10-CM | POA: Diagnosis not present

## 2012-01-27 DIAGNOSIS — Z7901 Long term (current) use of anticoagulants: Secondary | ICD-10-CM | POA: Diagnosis not present

## 2012-02-02 DIAGNOSIS — I495 Sick sinus syndrome: Secondary | ICD-10-CM | POA: Diagnosis not present

## 2012-02-02 DIAGNOSIS — I2510 Atherosclerotic heart disease of native coronary artery without angina pectoris: Secondary | ICD-10-CM | POA: Diagnosis not present

## 2012-02-02 DIAGNOSIS — I472 Ventricular tachycardia: Secondary | ICD-10-CM | POA: Diagnosis not present

## 2012-02-12 DIAGNOSIS — G894 Chronic pain syndrome: Secondary | ICD-10-CM | POA: Diagnosis not present

## 2012-02-12 DIAGNOSIS — M1990 Unspecified osteoarthritis, unspecified site: Secondary | ICD-10-CM | POA: Diagnosis not present

## 2012-02-12 DIAGNOSIS — M2550 Pain in unspecified joint: Secondary | ICD-10-CM | POA: Diagnosis not present

## 2012-02-24 DIAGNOSIS — I4891 Unspecified atrial fibrillation: Secondary | ICD-10-CM | POA: Diagnosis not present

## 2012-02-24 DIAGNOSIS — Z7901 Long term (current) use of anticoagulants: Secondary | ICD-10-CM | POA: Diagnosis not present

## 2012-02-29 ENCOUNTER — Ambulatory Visit: Payer: Medicare Other

## 2012-02-29 ENCOUNTER — Ambulatory Visit (INDEPENDENT_AMBULATORY_CARE_PROVIDER_SITE_OTHER): Payer: Medicare Other | Admitting: Emergency Medicine

## 2012-02-29 VITALS — BP 129/65 | HR 66 | Temp 98.9°F | Resp 18 | Ht 69.0 in | Wt 197.0 lb

## 2012-02-29 DIAGNOSIS — R059 Cough, unspecified: Secondary | ICD-10-CM

## 2012-02-29 DIAGNOSIS — R05 Cough: Secondary | ICD-10-CM

## 2012-02-29 DIAGNOSIS — E119 Type 2 diabetes mellitus without complications: Secondary | ICD-10-CM

## 2012-02-29 DIAGNOSIS — J111 Influenza due to unidentified influenza virus with other respiratory manifestations: Secondary | ICD-10-CM

## 2012-02-29 LAB — GLUCOSE, POCT (MANUAL RESULT ENTRY): POC Glucose: 85 mg/dl (ref 70–99)

## 2012-02-29 LAB — POCT CBC
HCT, POC: 43.7 % (ref 43.5–53.7)
Hemoglobin: 13.8 g/dL — AB (ref 14.1–18.1)
MCH, POC: 29.3 pg (ref 27–31.2)
MCV: 92.8 fL (ref 80–97)
RBC: 4.71 M/uL (ref 4.69–6.13)
WBC: 7.7 10*3/uL (ref 4.6–10.2)

## 2012-02-29 NOTE — Progress Notes (Signed)
  Subjective:    Patient ID: Jon Davis, male    DOB: May 27, 1926, 79 y.o.   MRN: [id number]  HPI 79 year old gentleman with a history of diabetes atrial fibrillation CVA and dementia who presents with onset Thursday night of fatigue and cough as well as a sore throat. He took a flu shot this year. He has a history of fairly significant arthritis he has been very immobile at home. He feels extremely bad and severely fatigued along with his head congestion and cough.    Review of Systems     Objective:   Physical Exam The patient is cooperative. He needs assistance to get from a lying to sitting position. His neck is supple. His chest exam is clear to both auscultation and percussion. His cardiac exam is regular rate no murmurs the abdomen is soft without tenderness or masses [facility] reading (PRIMARY) by  Dr Cleta Alberts is a pacemaker present. There is no infiltrate seen Results for orders placed in visit on 02/29/12  POCT CBC      Component Value Range   WBC 7.7  4.6 - 10.2 K/uL   Lymph, poc 1.6  0.6 - 3.4   POC LYMPH PERCENT 20.8  10 - 50 %L   MID (cbc) 1.0 (*) 0 - 0.9   POC MID % 13.4 (*) 0 - 12 %M   POC Granulocyte 5.1  2 - 6.9   Granulocyte percent 65.8  37 - 80 %G   RBC 4.71  4.69 - 6.13 M/uL   Hemoglobin 13.8 (*) 14.1 - 18.1 g/dL   HCT, POC 19.1  47.8 - 53.7 %   MCV 92.8  80 - 97 fL   MCH, POC 29.3  27 - 31.2 pg   MCHC 31.6 (*) 31.8 - 35.4 g/dL   RDW, POC 29.5     Platelet Count, POC 225  142 - 424 K/uL   MPV 8.6  0 - 99.8 fL  POCT INFLUENZA A/B      Component Value Range   Influenza A, POC Positive     Influenza B, POC Negative    GLUCOSE, POCT (MANUAL RESULT ENTRY)      Component Value Range   POC Glucose 85  70 - 99 mg/dl         Assessment & Plan:  We'll check a CBC chest x-ray. Flu swab was obtained. He is diabetic so we'll also check his blood sugar.

## 2012-02-29 NOTE — Patient Instructions (Addendum)
Influenza, Adult Influenza ("the flu") is a viral infection of the respiratory tract. It occurs more often in winter months because people spend more time in close contact with one another. Influenza can make you feel very sick. Influenza easily spreads from person to person (contagious). CAUSES   Influenza is caused by a virus that infects the respiratory tract. You can catch the virus by breathing in droplets from an infected person's cough or sneeze. You can also catch the virus by touching something that was recently contaminated with the virus and then touching your mouth, nose, or eyes. SYMPTOMS   Symptoms typically last 4 to 10 days and may include:  Fever.   Chills.   Headache, body aches, and muscle aches.   Sore throat.   Chest discomfort and cough.   Poor appetite.   Weakness or feeling tired.   Dizziness.   Nausea or vomiting.  DIAGNOSIS   Diagnosis of influenza is often made based on your history and a physical exam. A nose or throat swab test can be done to confirm the diagnosis. RISKS AND COMPLICATIONS You may be at risk for a more severe case of influenza if you smoke cigarettes, have diabetes, have chronic heart disease (such as heart failure) or lung disease (such as asthma), or if you have a weakened immune system. Elderly people and pregnant women are also at risk for more serious infections. The most common complication of influenza is a lung infection (pneumonia). Sometimes, this complication can require emergency medical care and may be life-threatening. PREVENTION   An annual influenza vaccination (flu shot) is the best way to avoid getting influenza. An annual flu shot is now routinely recommended for all adults in the [place] TREATMENT   In mild cases, influenza goes away on its own. Treatment is directed at relieving symptoms. For more severe cases, your caregiver may prescribe antiviral medicines to shorten the sickness. Antibiotic medicines are not effective,  because the infection is caused by a virus, not by bacteria. HOME CARE INSTRUCTIONS  Only take over-the-counter or prescription medicines for pain, discomfort, or fever as directed by your caregiver.   Use a cool mist humidifier to make breathing easier.   Get plenty of rest until your temperature returns to normal. This usually takes 3 to 4 days.   Drink enough fluids to keep your urine clear or pale yellow.   Cover your mouth and nose when coughing or sneezing, and wash your hands well to avoid spreading the virus.   Stay home from work or school until your fever has been gone for at least 1 full day.  SEEK MEDICAL CARE IF:    You have chest pain or a deep cough that worsens or produces more mucus.   You have nausea, vomiting, or diarrhea.  SEEK IMMEDIATE MEDICAL CARE IF:    You have difficulty breathing, shortness of breath, or your skin or nails turn bluish.   You have severe neck pain or stiffness.   You have a severe headache, facial pain, or earache.   You have a worsening or recurring fever.   You have nausea or vomiting that cannot be controlled.  MAKE SURE YOU:  Understand these instructions.   Will watch your condition.   Will get help right away if you are not doing well or get worse.  Document Released: 01/25/2000 Document Revised: 07/29/2011 Document Reviewed: 04/28/2011 Exit[employer] Patient Information 2013 [employer].    

## 2012-03-16 DIAGNOSIS — I4891 Unspecified atrial fibrillation: Secondary | ICD-10-CM | POA: Diagnosis not present

## 2012-03-16 DIAGNOSIS — Z7901 Long term (current) use of anticoagulants: Secondary | ICD-10-CM | POA: Diagnosis not present

## 2012-04-14 DIAGNOSIS — I4891 Unspecified atrial fibrillation: Secondary | ICD-10-CM | POA: Diagnosis not present

## 2012-04-14 DIAGNOSIS — Z7901 Long term (current) use of anticoagulants: Secondary | ICD-10-CM | POA: Diagnosis not present

## 2012-04-26 ENCOUNTER — Ambulatory Visit: Payer: Self-pay | Admitting: Cardiovascular Disease

## 2012-04-26 DIAGNOSIS — Z7901 Long term (current) use of anticoagulants: Secondary | ICD-10-CM

## 2012-04-26 DIAGNOSIS — I6350 Cerebral infarction due to unspecified occlusion or stenosis of unspecified cerebral artery: Secondary | ICD-10-CM

## 2012-04-26 DIAGNOSIS — I4891 Unspecified atrial fibrillation: Secondary | ICD-10-CM

## 2012-05-02 ENCOUNTER — Other Ambulatory Visit: Payer: Self-pay | Admitting: Neurology

## 2012-05-05 DIAGNOSIS — M1990 Unspecified osteoarthritis, unspecified site: Secondary | ICD-10-CM | POA: Diagnosis not present

## 2012-05-05 DIAGNOSIS — M2550 Pain in unspecified joint: Secondary | ICD-10-CM | POA: Diagnosis not present

## 2012-05-09 ENCOUNTER — Other Ambulatory Visit: Payer: Self-pay | Admitting: Internal Medicine

## 2012-05-12 DIAGNOSIS — Z7901 Long term (current) use of anticoagulants: Secondary | ICD-10-CM | POA: Diagnosis not present

## 2012-05-12 DIAGNOSIS — I4891 Unspecified atrial fibrillation: Secondary | ICD-10-CM | POA: Diagnosis not present

## 2012-05-26 ENCOUNTER — Telehealth: Payer: Self-pay

## 2012-05-26 DIAGNOSIS — I4891 Unspecified atrial fibrillation: Secondary | ICD-10-CM | POA: Diagnosis not present

## 2012-05-26 DIAGNOSIS — Z7901 Long term (current) use of anticoagulants: Secondary | ICD-10-CM | POA: Diagnosis not present

## 2012-05-26 MED ORDER — DONEPEZIL HCL 10 MG PO TABS: 20.0000 mg | ORAL_TABLET | Freq: Every day | ORAL | Status: DC

## 2012-05-26 NOTE — Telephone Encounter (Signed)
Spouse called requesting rx for generic Aricept 10mg  two daily be sent to [facility] for 30 days supply per fill.  She said they have an old rx on file for one daily and they are in the process of trying to start getting meds via mail order.  Rx has been sent.

## 2012-06-09 DIAGNOSIS — I4891 Unspecified atrial fibrillation: Secondary | ICD-10-CM | POA: Diagnosis not present

## 2012-06-09 DIAGNOSIS — Z7901 Long term (current) use of anticoagulants: Secondary | ICD-10-CM | POA: Diagnosis not present

## 2012-06-10 DIAGNOSIS — D649 Anemia, unspecified: Secondary | ICD-10-CM

## 2012-06-10 HISTORY — DX: Anemia, unspecified: D64.9

## 2012-06-16 DIAGNOSIS — Z125 Encounter for screening for malignant neoplasm of prostate: Secondary | ICD-10-CM | POA: Diagnosis not present

## 2012-06-16 DIAGNOSIS — I2510 Atherosclerotic heart disease of native coronary artery without angina pectoris: Secondary | ICD-10-CM | POA: Diagnosis not present

## 2012-06-16 DIAGNOSIS — E1159 Type 2 diabetes mellitus with other circulatory complications: Secondary | ICD-10-CM | POA: Diagnosis not present

## 2012-06-16 DIAGNOSIS — E785 Hyperlipidemia, unspecified: Secondary | ICD-10-CM | POA: Diagnosis not present

## 2012-06-16 DIAGNOSIS — R972 Elevated prostate specific antigen [PSA]: Secondary | ICD-10-CM | POA: Diagnosis not present

## 2012-06-16 DIAGNOSIS — I10 Essential (primary) hypertension: Secondary | ICD-10-CM | POA: Diagnosis not present

## 2012-06-16 DIAGNOSIS — R82998 Other abnormal findings in urine: Secondary | ICD-10-CM | POA: Diagnosis not present

## 2012-06-18 DIAGNOSIS — I4891 Unspecified atrial fibrillation: Secondary | ICD-10-CM | POA: Diagnosis not present

## 2012-06-18 DIAGNOSIS — Z7901 Long term (current) use of anticoagulants: Secondary | ICD-10-CM | POA: Diagnosis not present

## 2012-06-25 ENCOUNTER — Telehealth: Payer: Self-pay | Admitting: Internal Medicine

## 2012-06-25 DIAGNOSIS — Z0000 Encounter for general adult medical examination without abnormal findings: Secondary | ICD-10-CM | POA: Diagnosis not present

## 2012-06-25 DIAGNOSIS — I2510 Atherosclerotic heart disease of native coronary artery without angina pectoris: Secondary | ICD-10-CM | POA: Diagnosis not present

## 2012-06-25 DIAGNOSIS — E1159 Type 2 diabetes mellitus with other circulatory complications: Secondary | ICD-10-CM | POA: Diagnosis not present

## 2012-06-25 DIAGNOSIS — I4891 Unspecified atrial fibrillation: Secondary | ICD-10-CM | POA: Diagnosis not present

## 2012-06-25 DIAGNOSIS — E785 Hyperlipidemia, unspecified: Secondary | ICD-10-CM | POA: Diagnosis not present

## 2012-06-25 DIAGNOSIS — M1990 Unspecified osteoarthritis, unspecified site: Secondary | ICD-10-CM | POA: Diagnosis not present

## 2012-06-25 DIAGNOSIS — R972 Elevated prostate specific antigen [PSA]: Secondary | ICD-10-CM | POA: Diagnosis not present

## 2012-06-25 NOTE — Telephone Encounter (Signed)
Scheduled patient on 06/29/12 at 9:30 AM with Willette Cluster, NP . Malachi Bonds aware and will fax [place] records.

## 2012-06-28 ENCOUNTER — Encounter: Payer: Self-pay | Admitting: *Deleted

## 2012-06-28 ENCOUNTER — Ambulatory Visit (INDEPENDENT_AMBULATORY_CARE_PROVIDER_SITE_OTHER): Payer: Medicare Other | Admitting: Pharmacist Clinician (PhC)/ Clinical Pharmacy Specialist

## 2012-06-28 VITALS — BP 122/60 | HR 64

## 2012-06-28 DIAGNOSIS — I4891 Unspecified atrial fibrillation: Secondary | ICD-10-CM | POA: Diagnosis not present

## 2012-06-28 DIAGNOSIS — I6350 Cerebral infarction due to unspecified occlusion or stenosis of unspecified cerebral artery: Secondary | ICD-10-CM | POA: Diagnosis not present

## 2012-06-28 DIAGNOSIS — Z7901 Long term (current) use of anticoagulants: Secondary | ICD-10-CM

## 2012-06-28 LAB — POCT INR: INR: 5.8

## 2012-06-29 ENCOUNTER — Other Ambulatory Visit (INDEPENDENT_AMBULATORY_CARE_PROVIDER_SITE_OTHER): Payer: Medicare Other

## 2012-06-29 ENCOUNTER — Encounter: Payer: Self-pay | Admitting: Nurse Practitioner

## 2012-06-29 ENCOUNTER — Ambulatory Visit (INDEPENDENT_AMBULATORY_CARE_PROVIDER_SITE_OTHER): Payer: Medicare Other | Admitting: Nurse Practitioner

## 2012-06-29 VITALS — BP 120/60 | HR 72 | Ht 68.7 in | Wt 204.2 lb

## 2012-06-29 DIAGNOSIS — D649 Anemia, unspecified: Secondary | ICD-10-CM | POA: Diagnosis not present

## 2012-06-29 DIAGNOSIS — D509 Iron deficiency anemia, unspecified: Secondary | ICD-10-CM | POA: Diagnosis not present

## 2012-06-29 LAB — CBC WITH DIFFERENTIAL/PLATELET
Basophils Absolute: 0.1 10*3/uL (ref 0.0–0.1)
Basophils Relative: 1.6 % (ref 0.0–3.0)
Hemoglobin: 9.4 g/dL — ABNORMAL LOW (ref 13.0–17.0)
Lymphocytes Relative: 28.1 % (ref 12.0–46.0)
Monocytes Relative: 12.6 % — ABNORMAL HIGH (ref 3.0–12.0)
Neutro Abs: 4.2 10*3/uL (ref 1.4–7.7)
RBC: 3.79 Mil/uL — ABNORMAL LOW (ref 4.22–5.81)

## 2012-06-29 NOTE — Patient Instructions (Addendum)
You have been scheduled for an endoscopy with propofol. Please follow written instructions given to you at your visit today. If you use inhalers (even only as needed), please bring them with you on the day of your procedure. Your physician has requested that you go to www.startemmi.com and enter the access code given to you at your visit today. This web site gives a general overview about your procedure. However, you should still follow specific instructions given to you by our office regarding your preparation for the procedure. Your physician has requested that you go to the basement for the following lab work before leaving today: CBC Continue your PPI as directed.  CC:  Guerry Bruin MD

## 2012-06-29 NOTE — Progress Notes (Signed)
HPI :  Patient is an 79 year old male with multiple medical problems,on multiple medications including Coumadin. He was evaluated by Dr. In early 2012 for iron deficiency anemia / heme positive stools in the setting of anticoagulation. Upper endoscopy for anemia and presbyesophagus revealed moderate gastritis (biopsies c/w ulcerated gastric antral mucosa with minimal chronic inflammation and reactive changes, duodenitis and presbyesophagus as well as a hiatal hernia. Esophagus was dilated empirically with a Maloney dilator. Colonoscopy was pursued and revealed moderate diverticulosis in the sigmoid colon, exam otherwise normal.  Patient referred for recurrent anemia. He was seen by  PCP a few days ago, found to have recurrent anemia with hgb of 9.6, MCV 77. PCP started him on oral iron and PPI. Cardiology placed coumadin on hold while anemia being evaluated. No overt bleeding. Takes a daily baby ASA but no other NSAIDS. There was some confusion about whether patient had been taking H2 blocker at home, he has not per wife. He does have occasional GERD symptoms. No bowel  changes. No abdominal pain. No nausea or vomiting.  No weight loss.   Patient reports swallowing difficulties again. He doesn't feel food gets stuck but in esophagus but reports coughing with solids and liquids, especially at breakfast time.  Swallowing was better for about a year after January 2012 dilation. Wife doesn't feel patient getting adequate nutrition but both deny weight loss.   Past Medical History  Diagnosis Date  . Diabetes   . Hypertension   . Depression   . Dementia in Alzheimer's disease   . Hyperlipidemia   . Episodic VTach, pacer     Followed by Dr. Benedict Needy  . Urinary incontinence   . Anemia 06/2012  . Elevated PSA   . Microalbuminuria   . BPH (benign prostatic hyperplasia)     Followed by Dr. Sherron Monday  . Hiatal hernia 02/26/2010  . Gastritis 02/26/2010    moderate  . Diverticulosis 02/26/2010     Sigmoid colon  . Duodenitis 02/26/2010   Family History  Problem Relation Age of Onset  . Heart disease Father     Deceased  . Alzheimer's disease Mother     Deceased   History  Substance Use Topics  . Smoking status: Former Games developer  . Smokeless tobacco: Never Used  . Alcohol Use: No   Current Outpatient Prescriptions  Medication Sig Dispense Refill  . amLODipine (NORVASC) 5 MG tablet Take 5 mg by mouth daily.      [name] aspirin 81 MG tablet Take 81 mg by mouth daily.      [name] donepezil (ARICEPT) 10 MG tablet Take 2 tablets (20 mg total) by mouth daily.  60 tablet  5  . ferrous sulfate 325 (65 FE) MG tablet TAKE 1 TABLET BY MOUTH 3 TIMES A DAY  100 tablet  0  . glipiZIDE (GLUCOTROL XL) 5 MG 24 hr tablet Take 5 mg by mouth daily.      [name] HYDROcodone-acetaminophen (NORCO/VICODIN) 5-325 MG per tablet Take 1 tablet by mouth every 6 (six) hours as needed for pain. Take 1 tab twice daily.      . isosorbide mononitrate (IMDUR) 60 MG 24 hr tablet Take 60 mg by mouth daily. Patient takes 1/2 tab daily      . metoprolol succinate (TOPROL-XL) 50 MG 24 hr tablet Take 50 mg by mouth daily. Take with or immediately following a meal.      . pantoprazole (PROTONIX) 40 MG tablet Take 40 mg by mouth daily.      [name]  pramipexole (MIRAPEX) 0.25 MG tablet Take 0.25 mg by mouth at bedtime as needed.      . rosuvastatin (CRESTOR) 20 MG tablet Take 20 mg by mouth daily.      . sertraline (ZOLOFT) 25 MG tablet Take 25 mg by mouth daily.      . traMADol (ULTRAM) 50 MG tablet Take 50 mg by mouth every 8 (eight) hours as needed.      . warfarin (COUMADIN) 5 MG tablet Take 5 mg by mouth daily.       No current facility-administered medications for this visit.   No Known Allergies  Review of Systems: All systems reviewed and negative except where noted in HPI.   Physical Exam: BP 120/60  Pulse 72  Ht 5' 8.7" (1.745 m)  Wt 204 lb 4 oz (92.647 kg)  BMI 30.43 kg/m2 Constitutional: Peasant,well-developed, white  male in no acute distress. HEENT: Normocephalic and atraumatic. Conjunctivae are normal. No scleral icterus. Neck supple.  Cardiovascular: Normal rate, regular rhythm. Murmur present Pulmonary/chest: Effort normal and breath sounds normal. No wheezing, rales or rhonchi. Abdominal: Soft, nondistended, nontender. Bowel sounds active throughout. There are no masses palpable. No hepatomegaly. Rectal: fleck of brown, heme negative stool Extremities: no edema Lymphadenopathy: No cervical adenopathy noted. Neurological: Alert and oriented to person place and time. Skin: Skin is warm and dry. No rashes noted. Psychiatric: Normal mood and affect. Behavior is normal.   ASSESSMENT AND PLAN: 1. Recurrent microcytic anemia in setting of chronic coumadin. Hgb 9.6, down from 13.8 in January of this year. No overt bleeding, heme negative today. Patient may have chronic GI blood loss from ongoing or recurrent erosive gastritis / duodenitis. Doubt colonic source of bleeding as he had a complete colonoscopy Jan 2012 with findings of only diverticulosis. For further evaluation patient will be scheduled for EGD. The benefits, risks, and potential complications of EGD with possible biopsies and/or dilation were discussed with the patient and he agrees to proceed. Not presently on coumadin  2. Recurrent dysphagia. Reports coughing with solids and liquids. Dysphagia sounds more oropharyngeal in nature though empirical dilation in 2012 helped him for about a year. Further evaluation / possible repeat dilation at time of EGD. If EGD negative consider MBSS.  3. Chronic anticoagulation. Coumadin was put on hold yesterday (for a month per wife) for evaluation of anemia.   4. GERD. Occasional symptoms. Apparently patient was on Zantac at one time but no longer taking anything. PPI started by PCP yesterday.   5. Multiple medical problems as listed in PMH. Patient has pacemaker.

## 2012-06-29 NOTE — Progress Notes (Signed)
Reviewed and discussed with Gunnar Fusi. Agree with limited GI w/up.

## 2012-07-06 ENCOUNTER — Encounter: Payer: Self-pay | Admitting: Internal Medicine

## 2012-07-21 ENCOUNTER — Encounter: Payer: Self-pay | Admitting: Internal Medicine

## 2012-07-21 ENCOUNTER — Ambulatory Visit (AMBULATORY_SURGERY_CENTER): Payer: Medicare Other | Admitting: Internal Medicine

## 2012-07-21 VITALS — BP 125/71 | HR 63 | Temp 97.3°F | Resp 18 | Ht 68.0 in | Wt 204.0 lb

## 2012-07-21 DIAGNOSIS — I4891 Unspecified atrial fibrillation: Secondary | ICD-10-CM | POA: Diagnosis not present

## 2012-07-21 DIAGNOSIS — R1319 Other dysphagia: Secondary | ICD-10-CM

## 2012-07-21 DIAGNOSIS — E119 Type 2 diabetes mellitus without complications: Secondary | ICD-10-CM | POA: Diagnosis not present

## 2012-07-21 DIAGNOSIS — D509 Iron deficiency anemia, unspecified: Secondary | ICD-10-CM

## 2012-07-21 DIAGNOSIS — D649 Anemia, unspecified: Secondary | ICD-10-CM | POA: Diagnosis not present

## 2012-07-21 DIAGNOSIS — K222 Esophageal obstruction: Secondary | ICD-10-CM

## 2012-07-21 LAB — GLUCOSE, CAPILLARY: Glucose-Capillary: 101 mg/dL — ABNORMAL HIGH (ref 70–99)

## 2012-07-21 MED ORDER — SODIUM CHLORIDE 0.9 % IV SOLN: 500.0000 mL | INTRAVENOUS | Status: DC

## 2012-07-21 NOTE — Progress Notes (Signed)
The pt's procedure was completed when I was able to come into the procedure room.  I did not assist with the esophageal dilation.  The tech, Norche Pettiway assisted Dr. Juanda Chance with the dilatation. Maw

## 2012-07-21 NOTE — Patient Instructions (Addendum)
YOU HAD AN ENDOSCOPIC PROCEDURE TODAY AT THE [facility]: Refer to the procedure report that was given to you for any specific questions about what was found during the examination.  If the procedure report does not answer your questions, please call your gastroenterologist to clarify.  If you requested that your care partner not be given the details of your procedure findings, then the procedure report has been included in a sealed envelope for you to review at your convenience later.  YOU SHOULD EXPECT: Some feelings of bloating in the abdomen. Passage of more gas than usual.  Walking can help get rid of the air that was put into your GI tract during the procedure and reduce the bloating. If you had a lower endoscopy (such as a colonoscopy or flexible sigmoidoscopy) you may notice spotting of blood in your stool or on the toilet paper. If you underwent a bowel prep for your procedure, then you may not have a normal bowel movement for a few days.  DIET: See dilation Diet- Drink plenty of fluids but you should avoid alcoholic beverages for 24 hours.  ACTIVITY: Your care partner should take you home directly after the procedure.  You should plan to take it easy, moving slowly for the rest of the day.  You can resume normal activity the day after the procedure however you should NOT DRIVE or use heavy machinery for 24 hours (because of the sedation medicines used during the test).    SYMPTOMS TO REPORT IMMEDIATELY: A gastroenterologist can be reached at any hour.  During normal business hours, 8:30 AM to 5:00 PM Monday through Friday, call [phone].  After hours and on weekends, please call the GI answering service at [phone] who will take a message and have the physician on call contact you.   Following upper endoscopy (EGD)  Vomiting of blood or coffee ground material  New chest pain or pain under the shoulder blades  Painful or persistently difficult swallowing  New  shortness of breath  Fever of 100F or higher  Black, tarry-looking stools  FOLLOW UP: If any biopsies were taken you will be contacted by phone or by letter within the next 1-3 weeks.  Call your gastroenterologist if you have not heard about the biopsies in 3 weeks.  Our staff will call the home number listed on your records the next business day following your procedure to check on you and address any questions or concerns that you may have at that time regarding the information given to you following your procedure. This is a courtesy call and so if there is no answer at the home number and we have not heard from you through the emergency physician on call, we will assume that you have returned to your regular daily activities without incident.  SIGNATURES/CONFIDENTIALITY: You and/or your care partner have signed paperwork which will be entered into your electronic medical record.  These signatures attest to the fact that that the information above on your After Visit Summary has been reviewed and is understood.  Full responsibility of the confidentiality of this discharge information lies with you and/or your care-partner.  Dilation diet, anti-reflux regimen, stricture-handouts given  Continue protonix  Follow Hemoglobin and hematocrit while on Iron supplements  Resume counmadin today 07/21/12

## 2012-07-21 NOTE — Progress Notes (Signed)
Patient did not experience any of the following events: a burn prior to discharge; a fall within the facility; wrong site/side/patient/procedure/implant event; or a hospital transfer or hospital admission upon discharge from the facility. (G8907) Patient did not have preoperative order for IV antibiotic SSI prophylaxis. (G8918)  

## 2012-07-21 NOTE — Op Note (Signed)
[facility] [address] [place], [address]   ENDOSCOPY PROCEDURE REPORT  PATIENT: Jon Davis, Jon Davis  MR#: [id number] BIRTHDATE: Feb 03, 1927 , 86  yrs. old GENDER: Male ENDOSCOPIST: Hart Carwin, MD REFERRED BY:  Guerry Bruin, M.D. PROCEDURE DATE:  07/21/2012 PROCEDURE:  EGD, diagnostic and Savary dilation of esophagus ASA CLASS:     Class III INDICATIONS:  Iron deficiency anemia. , Hgb 9.6, on Coumadin ( on hold),prior EGD 2012-gastritis,,dysphagia MEDICATIONS: MAC sedation, administered by CRNA and propofol (Diprivan) 150mg  IV TOPICAL ANESTHETIC: none  DESCRIPTION OF PROCEDURE: After the risks benefits and alternatives of the procedure were thoroughly explained, informed consent was obtained.  The LB [id number] endoscope was introduced through the mouth and advanced to the second portion of the duodenum. Without limitations.  The instrument was slowly withdrawn as the mucosa was fully examined.      esophagus: Esophageal mucosa appeared normal throughout the proximal mid and distal esophagus. Squamocolumnar junction showed mild esophageal stricture which allowed the endoscope to traverse into the stomach without resistance. The diameter of the stricture was around 15 mm. Distal to the stricture was then nonreducible 3 cm hiatal hernia. There were no Cameron erosions Stomach: Gastric mucosa appeared normal. Gastric antrum was unremarkable. The endoscope was retroflexed and the fundus and cardia appeared normal. Gastric outlet was unremarkable duodenum duodenal bulb and descending duodenum was normal Guidewire was then placed into the stomach and Savary dilators 16 mm passed over the guidewire without resistance. There was no blood on the dilator.[   The scope was then withdrawn from the patient and the procedure completed.  COMPLICATIONS: There were no complications. ENDOSCOPIC IMPRESSION: mild distal esophageal stricture status post  passage of 16 mm Savary dilator No evidence of low gastritis or a bleeding lesion 3 cm nonreducible hiatal hernia without Cameron erosions RECOMMENDATIONS: 1.  Anti-reflux regimen to be follow 2.  Continue PPI 3. follow  H/H while on Iron supplements 4. ressume Coumadin if anemia persists and pt remains heme positive, consider colonoscopy ( he is a high risk)  REPEAT EXAM: no  eSigned:  Hart Carwin, MD 07/21/2012 3:15 PM   CC:  PATIENT NAME:  Jerrit, Horen MR#: [id number]

## 2012-07-21 NOTE — Progress Notes (Signed)
Called to room to assist during endoscopic procedure.  Patient ID and intended procedure confirmed with present staff. Received instructions for my participation in the procedure from the performing physician.  

## 2012-07-22 ENCOUNTER — Telehealth: Payer: Self-pay | Admitting: *Deleted

## 2012-07-22 NOTE — Telephone Encounter (Signed)
  Follow up Call-  Call back number 07/21/2012  Post procedure Call Back phone  # [phone]   Permission to leave phone message Yes     Patient questions:  Do you have a fever, pain , or abdominal swelling? no Pain Score  0 *  Have you tolerated food without any problems? Yes   Have you been able to return to your normal activities? yes  Do you have any questions about your discharge instructions: Diet   no Medications  no Follow up visit  No   Do you have questions or concerns about your Care? no  Actions: * If pain score is 4 or above: No action needed, pain <4.

## 2012-07-24 ENCOUNTER — Encounter: Payer: Self-pay | Admitting: *Deleted

## 2012-07-28 ENCOUNTER — Encounter: Payer: Self-pay | Admitting: Cardiovascular Disease

## 2012-07-29 ENCOUNTER — Ambulatory Visit: Payer: Medicare Other | Admitting: Pharmacist Clinician (PhC)/ Clinical Pharmacy Specialist

## 2012-07-29 ENCOUNTER — Ambulatory Visit (INDEPENDENT_AMBULATORY_CARE_PROVIDER_SITE_OTHER): Payer: Medicare Other | Admitting: Cardiovascular Disease

## 2012-07-29 ENCOUNTER — Encounter: Payer: Self-pay | Admitting: Cardiovascular Disease

## 2012-07-29 VITALS — BP 120/50 | HR 65 | Resp 16 | Ht 71.0 in | Wt 200.5 lb

## 2012-07-29 DIAGNOSIS — I2510 Atherosclerotic heart disease of native coronary artery without angina pectoris: Secondary | ICD-10-CM

## 2012-07-29 DIAGNOSIS — I10 Essential (primary) hypertension: Secondary | ICD-10-CM

## 2012-07-29 DIAGNOSIS — I472 Ventricular tachycardia: Secondary | ICD-10-CM

## 2012-07-29 DIAGNOSIS — E119 Type 2 diabetes mellitus without complications: Secondary | ICD-10-CM

## 2012-07-29 DIAGNOSIS — I700 Atherosclerosis of aorta: Secondary | ICD-10-CM

## 2012-07-29 DIAGNOSIS — F068 Other specified mental disorders due to known physiological condition: Secondary | ICD-10-CM

## 2012-07-29 DIAGNOSIS — I4891 Unspecified atrial fibrillation: Secondary | ICD-10-CM | POA: Diagnosis not present

## 2012-07-29 DIAGNOSIS — D509 Iron deficiency anemia, unspecified: Secondary | ICD-10-CM

## 2012-07-29 DIAGNOSIS — E785 Hyperlipidemia, unspecified: Secondary | ICD-10-CM | POA: Diagnosis not present

## 2012-07-29 DIAGNOSIS — Z950 Presence of cardiac pacemaker: Secondary | ICD-10-CM

## 2012-07-29 LAB — PACEMAKER DEVICE OBSERVATION

## 2012-07-29 NOTE — Progress Notes (Signed)
In office pacemaker interrogation. Normal device function. No changes made this session. 

## 2012-07-29 NOTE — Patient Instructions (Addendum)
Please follow up with home pacemaker checks on 08-30-12, 10-04-12  We will follow up with you in the office in September.  Please try to avoid doing outside activities during the midday hours an/or hottest part of the day.  Call with any concerns.

## 2012-07-31 ENCOUNTER — Encounter: Payer: Self-pay | Admitting: Cardiovascular Disease

## 2012-07-31 DIAGNOSIS — I472 Ventricular tachycardia: Secondary | ICD-10-CM | POA: Insufficient documentation

## 2012-07-31 DIAGNOSIS — I700 Atherosclerosis of aorta: Secondary | ICD-10-CM | POA: Insufficient documentation

## 2012-07-31 DIAGNOSIS — Z950 Presence of cardiac pacemaker: Secondary | ICD-10-CM | POA: Insufficient documentation

## 2012-07-31 DIAGNOSIS — I2510 Atherosclerotic heart disease of native coronary artery without angina pectoris: Secondary | ICD-10-CM | POA: Insufficient documentation

## 2012-07-31 DIAGNOSIS — I4729 Other ventricular tachycardia: Secondary | ICD-10-CM | POA: Insufficient documentation

## 2012-07-31 NOTE — Assessment & Plan Note (Signed)
The etiology of this is unclear. Clearly treatment with warfarin is unfavorable under the circumstances. 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 There is no easy solution to this quandary. We will use his pacemaker to want [place] about episodes of atrial fibrillation. His device has not documented atrial arrhythmias in over a year. We will download arrhythmia loss from his device via the [employer] system on a monthly basis. Unfortunately his device does not allow automatic notification for episodes of atrial fibrillation.

## 2012-07-31 NOTE — Assessment & Plan Note (Signed)
An appropriate statin therapy. Most recent lipid profile showed favorable findings.

## 2012-07-31 NOTE — Assessment & Plan Note (Signed)
Type II on oral antidiabetics, most recent hemoglobin A1c that I have available for review is 5.9%.

## 2012-07-31 NOTE — Progress Notes (Signed)
Patient ID: Jon Davis, male   DOB: 1926/12/17, 79 y.o.   MRN: [id number]      Reason for office visit Atrial fibrillation, pacemaker, ventricular tachycardia, coronary artery disease  Jon Davis has done well from a cardiovascular standpoint since his last appointment but he has worsening problems with anemia. This appears to be iron deficiency anemia and he has Hemoccult positive stools. Colonoscopy 2 years ago only showed diverticulosis. A very recent EGD did not identify a source of bleeding. We were asked to consider temporary discontinuation of his warfarin and he has been off anticoagulants now for about a month.  Unfortunately he also has a history of recurrent persistent atrial fibrillation and has had old strokes. On the bright side his pacemaker has not shown evidence of any meaningful atrial tachycardia arrhythmias in over 2 years. Occasional nonsustained ventricular tachycardia is seen but this is never symptomatic.  He has known relatively minor coronary artery disease and is free of angina. He has extensive severe aortic atherosclerosis including mobile and ulcerated plaques in his descending thoracic aorta.  He remains active and enjoys gardening. He does not complaining of shortness of breath presyncope or angina. His wife asked me to reinforce the fact that it is not wise for him to work in the garden in the heat of noontime.  His dementia is worsening at a very slow pace. He remains very well compensated especially when his wife is nearby to offer occasional cues.    No Known Allergies  Current Outpatient Prescriptions  Medication Sig Dispense Refill  . ALPRAZolam (XANAX) 0.25 MG tablet Take 0.25 mg by mouth 3 (three) times daily as needed for sleep.      [name] amLODipine (NORVASC) 5 MG tablet Take 5 mg by mouth daily.      [name] donepezil (ARICEPT) 10 MG tablet Take 2 tablets (20 mg total) by mouth daily.  60 tablet  5  . ferrous sulfate 325 (65 FE) MG tablet TAKE 1  TABLET BY MOUTH 3 TIMES A DAY  100 tablet  0  . glipiZIDE (GLUCOTROL XL) 5 MG 24 hr tablet Take 5 mg by mouth daily.      . isosorbide mononitrate (IMDUR) 60 MG 24 hr tablet Take 60 mg by mouth daily. Patient takes 1/2 tab daily      . metoprolol succinate (TOPROL-XL) 50 MG 24 hr tablet Take 50 mg by mouth daily. Take with or immediately following a meal.      . pantoprazole (PROTONIX) 40 MG tablet Take 40 mg by mouth daily.      . rosuvastatin (CRESTOR) 20 MG tablet Take 20 mg by mouth daily.      . sertraline (ZOLOFT) 25 MG tablet Take 25 mg by mouth daily.      . traMADol (ULTRAM) 50 MG tablet Take 50 mg by mouth every 8 (eight) hours as needed.      . warfarin (COUMADIN) 5 MG tablet Take 5 mg by mouth daily.      [name] aspirin 81 MG tablet Take 81 mg by mouth daily.      [name] HYDROcodone-acetaminophen (NORCO/VICODIN) 5-325 MG per tablet Take 1 tablet by mouth every 6 (six) hours as needed for pain. Take 1 tab twice daily.      . pramipexole (MIRAPEX) 0.25 MG tablet Take 0.25 mg by mouth at bedtime as needed.       No current facility-administered medications for this visit.    Past Medical History  Diagnosis Date  .  Diabetes   . Hypertension   . Depression   . Dementia in Alzheimer's disease   . Hyperlipidemia   . Episodic VTach, pacer     Followed by Dr. Benedict Needy  . Urinary incontinence   . Anemia 06/2012  . Elevated PSA   . Microalbuminuria   . BPH (benign prostatic hyperplasia)     Followed by Dr. Sherron Monday  . Hiatal hernia 02/26/2010  . Gastritis 02/26/2010    moderate  . Diverticulosis 02/26/2010    Sigmoid colon  . Duodenitis 02/26/2010  . Brady-tachy syndrome     04/17/2009 MDT pacer Enrhythmatr fib  . Atrial fibrillation   . CAD (coronary artery disease)     Past Surgical History  Procedure Laterality Date  . Appendectomy  1948  . Permanent pacemaker insertion  04/17/2009    MDT Enrhythm  . Cholecystectomy    . Cataracts    . Cardiac catheterization  07/07/2007     mild to mod. ca+ w/narrowing of 20% ostium of second diagonal,diffuse 80% stenosis small caliber infer. branch,20% mid LAD,60-70% narrowing mid atrioventricular groove CX,mild 20-30% RCA  . Carotid doppler  10/01/2006    0-49% right bulb,prox,ECA & prox ICA,0-49% left subclavian bulb & prox ICA,right & left ICA abn. resistance  . Nm myoview ltd  10/01/2010    No ischemia    Family History  Problem Relation Age of Onset  . Heart disease Father     Deceased  . Alzheimer's disease Mother     Deceased    History   Social History  . Marital Status: Married    Spouse Name: N/A    Number of Children: N/A  . Years of Education: N/A   Occupational History  . Not on file.   Social History Main Topics  . Smoking status: Former Games developer  . Smokeless tobacco: Former Neurosurgeon    Quit date: 02/09/1974  . Alcohol Use: No  . Drug Use: No  . Sexually Active: No   Other Topics Concern  . Not on file   Social History Narrative  . No narrative on file    Review of systems: The patient specifically denies any chest pain at rest or with exertion, dyspnea at rest or with exertion, orthopnea, paroxysmal nocturnal dyspnea, syncope, palpitations, focal neurological deficits, intermittent claudication, lower extremity edema, unexplained weight gain, cough, hemoptysis or wheezing.  The patient also denies abdominal pain, nausea, vomiting, dysphagia, diarrhea, constipation, polyuria, polydipsia, dysuria, hematuria, frequency, urgency, abnormal bleeding or bruising, fever, chills, unexpected weight changes, mood swings, change in skin or hair texture, change in voice quality, auditory or visual problems, allergic reactions or rashes, new musculoskeletal complaints other than usual "aches and pains".   PHYSICAL EXAM BP 120/50  Pulse 65  Resp 16  Ht 5\' 11"  (1.803 m)  Wt 200 lb 8 oz (90.946 kg)  BMI 27.98 kg/m2  General: Alert, oriented x3, no distress Head: no evidence of trauma, PERRL, EOMI, no  exophtalmos or lid lag, no myxedema, no xanthelasma; normal ears, nose and oropharynx Neck: normal jugular venous pulsations and no hepatojugular reflux; brisk carotid pulses without delay and no carotid bruits Chest: clear to auscultation, no signs of consolidation by percussion or palpation, normal fremitus, symmetrical and full respiratory excursions; healthy left subclavian pacemaker site Cardiovascular: normal position and quality of the apical impulse, regular rhythm, normal first and second heart sounds, no murmurs, rubs or gallops Abdomen: no tenderness or distention, no masses by palpation, no abnormal pulsatility or arterial bruits, normal  bowel sounds, no hepatosplenomegaly Extremities: no clubbing, cyanosis or edema; 2+ radial, ulnar and brachial pulses bilaterally; 2+ right femoral, posterior tibial and dorsalis pedis pulses; 2+ left femoral, posterior tibial and dorsalis pedis pulses; no subclavian or femoral bruits Neurological: grossly nonfocal   EKG: Atrial paced ventricular sensed rhythm, left axis deviation just shy of full left anterior fascicular block  Lipid Panel     Component Value Date/Time   CHOL  Value: 119        ATP III CLASSIFICATION:  <200     mg/dL   Desirable  161-096  mg/dL   Borderline High  >=045    mg/dL   High 05/19/8117 1478   TRIG 90 07/06/2007 0140   HDL 38* 07/06/2007 0140   CHOLHDL 3.1 07/06/2007 0140   VLDL 18 07/06/2007 0140   LDLCALC  Value: 63        Total Cholesterol/HDL:CHD Risk Coronary Heart Disease Risk Table                     Men   Women  1/2 Average Risk   3.4   3.3 07/06/2007 0140    BMET    Component Value Date/Time   NA 137 04/17/2009 1029   K 3.8 04/17/2009 1029   CL 103 04/17/2009 1029   CO2 27 04/17/2009 1029   GLUCOSE 105* 04/17/2009 1029   BUN 12 04/17/2009 1029   CREATININE 1.17 04/17/2009 1029   CALCIUM 8.7 04/17/2009 1029   GFRNONAA 60* 04/17/2009 1029   GFRAA  Value: >60        The eGFR has been calculated using the MDRD equation. This  calculation has not been validated in all clinical situations. eGFR's persistently <60 mL/min signify possible Chronic Kidney Disease. 04/17/2009 1029     ASSESSMENT AND PLAN ANEMIA, IRON DEFICIENCY The etiology of this is unclear. Clearly treatment with warfarin is unfavorable under the circumstances. 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 There is no easy solution to this quandary. We will use his pacemaker to want [place] about episodes of atrial fibrillation. His device has not documented atrial arrhythmias in over a year. We will download arrhythmia loss from his device via the [facility] system on a monthly basis. Unfortunately his device does not allow automatic notification for episodes of atrial fibrillation.  ATRIAL FIBRILLATION We have not seen episodes of atrial fibrillation on his pacemaker for at least the last 2 years. Obviously dysrhythmia can recur without warning.  CAD (coronary artery disease) He has stenoses in secondary branches by cardiac catheterization 2009 and no evidence of meaningful areas of reversible ischemia by nuclear stress testing in August 2012. He does not describe angina. Risk factor modification is a [place] of therapy. Invasive procedures should be undertaken with great care since he is not an optimal revascularization candidate (note recurrent problems with anemia and Hemoccult-positive stools, without an identifiable source of bleeding).  HYPERLIPIDEMIA An appropriate statin therapy. Most recent lipid profile showed favorable findings.  DIABETES MELLITUS, TYPE II Type II on oral antidiabetics, most recent hemoglobin A1c that I have available for review is 5.9%.  HYPERTENSION Well-controlled  DEMENTIA This remains mild and fairly well compensated. She does well with occasional cues from his past  Nonsustained ventricular tachycardia Repeated brief episodes have been noted by his device. These have always been  asymptomatic. There is no evidence of active coronary insufficiency and he has preserved left ventricle systolic function. Beta blocker  therapy is appropriate.  Aortic atherosclerosis Severe ulcerated and mobile plaque is noted in the descending thoracic aorta by previous TEE. This is another disincentive to perform cardiac catheterizations especially via the femoral approach. He has atherosclerosis in the aortic arch as well and it is uncertain whether his previous strokes were related to arterial atherosclerotic disease/aortic embolic disease or cardioembolic disease from atrial fibrillation.  Pacemaker Medtronic Enrhythm model [id number] DR serial number PNP [id number] implanted March 2011 for sinus node dysfunction with symptomatic bradycardia as well as bradycardia due to necessary medications (beta blockers for ventricular tachycardia and atrial tachycardia and coronary disease). Flow in office device check shows normal function. Lead parameters are all within the desirable range. A few episodes of ventricular tachycardia have been recorded but he has not had any atrial tachycardia arrhythmia. Battery voltage is 3 V. 85% atrial pacing and less than 1% ventricular pacing. Please see separate pacemaker checked for full interrogation.  Orders Placed This Encounter  Procedures  . EKG 12-Lead   Meds ordered this encounter  Medications  . ALPRAZolam (XANAX) 0.25 MG tablet    Sig: Take 0.25 mg by mouth 3 (three) times daily as needed for sleep.    Junious Silk, MD, [facility] [phone] office [phone] pager

## 2012-07-31 NOTE — Assessment & Plan Note (Signed)
We have not seen episodes of atrial fibrillation on his pacemaker for at least the last 2 years. Obviously dysrhythmia can recur without warning.

## 2012-07-31 NOTE — Assessment & Plan Note (Signed)
Repeated brief episodes have been noted by his device. These have always been asymptomatic. There is no evidence of active coronary insufficiency and he has preserved left ventricle systolic function. Beta blocker therapy is appropriate.

## 2012-07-31 NOTE — Assessment & Plan Note (Signed)
This remains mild and fairly well compensated. She does well with occasional cues from his past

## 2012-07-31 NOTE — Assessment & Plan Note (Signed)
He has stenoses in secondary branches by cardiac catheterization 2009 and no evidence of meaningful areas of reversible ischemia by nuclear stress testing in August 2012. He does not describe angina. Risk factor modification is a [place] of therapy. Invasive procedures should be undertaken with great care since he is not an optimal revascularization candidate (note recurrent problems with anemia and Hemoccult-positive stools, without an identifiable source of bleeding).

## 2012-07-31 NOTE — Assessment & Plan Note (Signed)
Well controlled 

## 2012-07-31 NOTE — Assessment & Plan Note (Signed)
Severe ulcerated and mobile plaque is noted in the descending thoracic aorta by previous TEE. This is another disincentive to perform cardiac catheterizations especially via the femoral approach. He has atherosclerosis in the aortic arch as well and it is uncertain whether his previous strokes were related to arterial atherosclerotic disease/aortic embolic disease or cardioembolic disease from atrial fibrillation.

## 2012-07-31 NOTE — Assessment & Plan Note (Signed)
Medtronic Enrhythm model [id number] DR serial number PNP [id number] implanted March 2011 for sinus node dysfunction with symptomatic bradycardia as well as bradycardia due to necessary medications (beta blockers for ventricular tachycardia and atrial tachycardia and coronary disease). Flow in office device check shows normal function. Lead parameters are all within the desirable range. A few episodes of ventricular tachycardia have been recorded but he has not had any atrial tachycardia arrhythmia. Battery voltage is 3 V. 85% atrial pacing and less than 1% ventricular pacing. Please see separate pacemaker checked for full interrogation.

## 2012-08-17 ENCOUNTER — Other Ambulatory Visit: Payer: Self-pay

## 2012-08-17 MED ORDER — PRAMIPEXOLE DIHYDROCHLORIDE 0.125 MG PO TABS: 0.1250 mg | ORAL_TABLET | Freq: Every evening | ORAL | Status: DC

## 2012-08-17 MED ORDER — DONEPEZIL HCL 10 MG PO TABS: 20.0000 mg | ORAL_TABLET | Freq: Every day | ORAL | Status: DC

## 2012-08-19 ENCOUNTER — Ambulatory Visit: Payer: Medicare Other

## 2012-08-19 ENCOUNTER — Ambulatory Visit (INDEPENDENT_AMBULATORY_CARE_PROVIDER_SITE_OTHER): Payer: Medicare Other | Admitting: Family Medicine

## 2012-08-19 VITALS — BP 120/60 | HR 68 | Temp 98.4°F | Resp 17

## 2012-08-19 DIAGNOSIS — E119 Type 2 diabetes mellitus without complications: Secondary | ICD-10-CM

## 2012-08-19 DIAGNOSIS — R5381 Other malaise: Secondary | ICD-10-CM

## 2012-08-19 DIAGNOSIS — D649 Anemia, unspecified: Secondary | ICD-10-CM

## 2012-08-19 DIAGNOSIS — R059 Cough, unspecified: Secondary | ICD-10-CM | POA: Diagnosis not present

## 2012-08-19 DIAGNOSIS — J029 Acute pharyngitis, unspecified: Secondary | ICD-10-CM

## 2012-08-19 DIAGNOSIS — R5383 Other fatigue: Secondary | ICD-10-CM

## 2012-08-19 DIAGNOSIS — R05 Cough: Secondary | ICD-10-CM

## 2012-08-19 DIAGNOSIS — N390 Urinary tract infection, site not specified: Secondary | ICD-10-CM

## 2012-08-19 DIAGNOSIS — J3489 Other specified disorders of nose and nasal sinuses: Secondary | ICD-10-CM

## 2012-08-19 DIAGNOSIS — J069 Acute upper respiratory infection, unspecified: Secondary | ICD-10-CM

## 2012-08-19 LAB — POCT CBC
Granulocyte percent: 63.3 % (ref 37–80)
HCT, POC: 41.7 % — AB (ref 43.5–53.7)
Hemoglobin: 13 g/dL — AB (ref 14.1–18.1)
Lymph, poc: 1.7 (ref 0.6–3.4)
MCH, POC: 27.7 pg (ref 27–31.2)
MCHC: 31.2 g/dL — AB (ref 31.8–35.4)
MCV: 88.7 fL (ref 80–97)
MID (cbc): 0.8 (ref 0–0.9)
MPV: 8.6 fL (ref 0–99.8)
POC Granulocyte: 4.2 (ref 2–6.9)
POC LYMPH PERCENT: 24.9 %L (ref 10–50)
POC MID %: 11.8 % (ref 0–12)
Platelet Count, POC: 256 10*3/uL (ref 142–424)
RBC: 4.7 M/uL (ref 4.69–6.13)
RDW, POC: 22.9 %
WBC: 6.7 10*3/uL (ref 4.6–10.2)

## 2012-08-19 LAB — POCT UA - MICROSCOPIC ONLY
Casts, Ur, LPF, POC: NEGATIVE
Crystals, Ur, HPF, POC: NEGATIVE
Mucus, UA: NEGATIVE
Yeast, UA: NEGATIVE

## 2012-08-19 LAB — COMPREHENSIVE METABOLIC PANEL
Albumin: 4.1 g/dL (ref 3.5–5.2)
Alkaline Phosphatase: 62 U/L (ref 39–117)
BUN: 10 mg/dL (ref 6–23)
CO2: 30 mEq/L (ref 19–32)
Glucose, Bld: 83 mg/dL (ref 70–99)
Total Bilirubin: 0.4 mg/dL (ref 0.3–1.2)

## 2012-08-19 LAB — COMPREHENSIVE METABOLIC PANEL WITH GFR
ALT: 13 U/L (ref 0–53)
AST: 19 U/L (ref 0–37)
Calcium: 8.9 mg/dL (ref 8.4–10.5)
Chloride: 105 meq/L (ref 96–112)
Creat: 1.21 mg/dL (ref 0.50–1.35)
Potassium: 3.7 meq/L (ref 3.5–5.3)
Sodium: 143 meq/L (ref 135–145)
Total Protein: 6.9 g/dL (ref 6.0–8.3)

## 2012-08-19 LAB — POCT URINALYSIS DIPSTICK
Glucose, UA: 100
Nitrite, UA: NEGATIVE
Protein, UA: 100
Spec Grav, UA: >=1.03
Urobilinogen, UA: 0.2
pH, UA: 5.5

## 2012-08-19 LAB — GLUCOSE, POCT (MANUAL RESULT ENTRY): POC Glucose: 74 mg/dL (ref 70–99)

## 2012-08-19 LAB — POCT RAPID STREP A (OFFICE): Rapid Strep A Screen: NEGATIVE

## 2012-08-19 MED ORDER — BENZONATATE 100 MG PO CAPS: 200.0000 mg | ORAL_CAPSULE | Freq: Two times a day (BID) | ORAL | Status: DC | PRN

## 2012-08-19 MED ORDER — IPRATROPIUM BROMIDE 0.03 % NA SOLN: 2.0000 | Freq: Two times a day (BID) | NASAL | Status: DC

## 2012-08-19 MED ORDER — LEVOFLOXACIN 500 MG PO TABS
500.0000 mg | ORAL_TABLET | Freq: Every day | ORAL | Status: DC
Start: 2012-08-19 — End: 2012-10-13

## 2012-08-19 NOTE — Progress Notes (Signed)
[facility]:  Office Visit  Chief Complaint:  Chief Complaint  Patient presents with  . coughing    x4 days   . Fatigue    HPI: Jon Davis is a 79 y.o. male who complains of  4 day history of thorat pain, coughing and feeling tired. Dry cough. No fevers, chills. No facial pain or ear pain. He "has a strange sound" in his throat.  Has a h/o afib . +Pacemaker. He also has wellcontrolled DM, Dementia , and HTN. He was recently at his cardiologist and he has anemia, 1 month ago Hgb was 9.4. He is fatigued. IN the lobby the nurse states he was slightly wobbly , he was fine comin gin but whe he got up to stand he walk into the exam room he was wobbly. Deneis SOB, CP, wheezing or palpitations.  HE denies allergies or asthma.   Past Medical History  Diagnosis Date  . Diabetes   . Hypertension   . Depression   . Dementia in Alzheimer's disease   . Hyperlipidemia   . Episodic VTach, pacer     Followed by Dr. Benedict Needy  . Urinary incontinence   . Anemia 06/2012  . Elevated PSA   . Microalbuminuria   . BPH (benign prostatic hyperplasia)     Followed by Dr. Sherron Monday  . Hiatal hernia 02/26/2010  . Gastritis 02/26/2010    moderate  . Diverticulosis 02/26/2010    Sigmoid colon  . Duodenitis 02/26/2010  . Brady-tachy syndrome     04/17/2009 MDT pacer Enrhythmatr fib  . Atrial fibrillation   . CAD (coronary artery disease)    Past Surgical History  Procedure Laterality Date  . Appendectomy  1948  . Permanent pacemaker insertion  04/17/2009    MDT Enrhythm  . Cholecystectomy    . Cataracts    . Cardiac catheterization  07/07/2007    mild to mod. ca+ w/narrowing of 20% ostium of second diagonal,diffuse 80% stenosis small caliber infer. branch,20% mid LAD,60-70% narrowing mid atrioventricular groove CX,mild 20-30% RCA  . Carotid doppler  10/01/2006    0-49% right bulb,prox,ECA & prox ICA,0-49% left subclavian bulb & prox ICA,right & left ICA abn. resistance  . Nm  myoview ltd  10/01/2010    No ischemia   History   Social History  . Marital Status: Married    Spouse Name: N/A    Number of Children: N/A  . Years of Education: N/A   Social History Main Topics  . Smoking status: Former Games developer  . Smokeless tobacco: Former Neurosurgeon    Quit date: 02/09/1974  . Alcohol Use: No  . Drug Use: No  . Sexually Active: No   Other Topics Concern  . None   Social History Narrative  . None   Family History  Problem Relation Age of Onset  . Heart disease Father     Deceased  . Alzheimer's disease Mother     Deceased   No Known Allergies Prior to Admission medications   Medication Sig Start Date End Date Taking? Authorizing Provider  ALPRAZolam (XANAX) 0.25 MG tablet Take 0.25 mg by mouth 3 (three) times daily as needed for sleep.   Yes Historical Provider, MD  amLODipine (NORVASC) 5 MG tablet Take 5 mg by mouth daily.   Yes Historical Provider, MD  aspirin 81 MG tablet Take 81 mg by mouth daily.   Yes Historical Provider, MD  donepezil (ARICEPT) 10 MG tablet Take 2 tablets (20 mg total) by  mouth daily. 08/17/12  Yes Carmen Dohmeier, MD  ferrous sulfate 325 (65 FE) MG tablet TAKE 1 TABLET BY MOUTH 3 TIMES A DAY 07/08/10  Yes Hart Carwin, MD  glipiZIDE (GLUCOTROL XL) 5 MG 24 hr tablet Take 5 mg by mouth daily.   Yes Historical Provider, MD  HYDROcodone-acetaminophen (NORCO/VICODIN) 5-325 MG per tablet Take 1 tablet by mouth every 6 (six) hours as needed for pain. Take 1 tab twice daily.   Yes Historical Provider, MD  isosorbide mononitrate (IMDUR) 60 MG 24 hr tablet Take 60 mg by mouth daily. Patient takes 1/2 tab daily   Yes Historical Provider, MD  metoprolol succinate (TOPROL-XL) 50 MG 24 hr tablet Take 50 mg by mouth daily. Take with or immediately following a meal.   Yes Historical Provider, MD  pantoprazole (PROTONIX) 40 MG tablet Take 40 mg by mouth daily.   Yes Historical Provider, MD  pramipexole (MIRAPEX) 0.125 MG tablet Take 1 tablet (0.125 mg  total) by mouth every evening. Two hours before bedtime 08/17/12  Yes Carmen Dohmeier, MD  rosuvastatin (CRESTOR) 20 MG tablet Take 20 mg by mouth daily.   Yes Historical Provider, MD  sertraline (ZOLOFT) 25 MG tablet Take 25 mg by mouth daily.   Yes Historical Provider, MD  traMADol (ULTRAM) 50 MG tablet Take 50 mg by mouth every 8 (eight) hours as needed.   Yes Historical Provider, MD  warfarin (COUMADIN) 5 MG tablet Take 5 mg by mouth daily.   Yes Historical Provider, MD     ROS: The patient denies fevers, chills, night sweats, unintentional weight loss, chest pain, palpitations, wheezing, dyspnea on exertion, nausea, vomiting, abdominal pain, dysuria, hematuria, melena, numbness, weakness, or tingling.  All other systems have been reviewed and were otherwise negative with the exception of those mentioned in the HPI and as above.    PHYSICAL EXAM: Filed Vitals:   08/19/12 1119  BP: 120/60  Pulse: 68  Temp: 98.4 F (36.9 C)  Resp: 17   There were no vitals filed for this visit. There is no weight on file to calculate BMI.  General: Alert, no acute distress HEENT:  Normocephalic, atraumatic, oropharynx patent. + sinus tenderness, erythematous throat, +ND. Tm nl.  Cardiovascular:  Regular rate and rhythm, no rubs murmurs or gallops.  No Carotid bruits, radial pulse intact. No pedal edema.  Respiratory: Clear to auscultation bilaterally.  No wheezes, rales, or rhonchi.  No cyanosis, no use of accessory musculature GI: No organomegaly, abdomen is soft and non-tender, positive bowel sounds.  No masses. Skin: No rashes. Neurologic: Facial musculature symmetric. Psychiatric: Patient is appropriate throughout our interaction. Lymphatic: No cervical lymphadenopathy Musculoskeletal: Gait intact. No CVA tenderness   LABS: Results for orders placed in visit on 08/19/12  POCT CBC      Result Value Range   WBC 6.7  4.6 - 10.2 K/uL   Lymph, poc 1.7  0.6 - 3.4   POC LYMPH PERCENT 24.9  10 -  50 %L   MID (cbc) 0.8  0 - 0.9   POC MID % 11.8  0 - 12 %M   POC Granulocyte 4.2  2 - 6.9   Granulocyte percent 63.3  37 - 80 %G   RBC 4.70  4.69 - 6.13 M/uL   Hemoglobin 13.0 (*) 14.1 - 18.1 g/dL   HCT, POC 16.1 (*) 09.6 - 53.7 %   MCV 88.7  80 - 97 fL   MCH, POC 27.7  27 - 31.2 pg  MCHC 31.2 (*) 31.8 - 35.4 g/dL   RDW, POC 62.1     Platelet Count, POC 256  142 - 424 K/uL   MPV 8.6  0 - 99.8 fL  GLUCOSE, POCT (MANUAL RESULT ENTRY)      Result Value Range   POC Glucose 74  70 - 99 mg/dl  POCT RAPID STREP A (OFFICE)      Result Value Range   Rapid Strep A Screen Negative  Negative  POCT UA - MICROSCOPIC ONLY      Result Value Range   WBC, Ur, HPF, POC 3-5     RBC, urine, microscopic 10-15     Bacteria, U Microscopic trace     Mucus, UA neg     Epithelial cells, urine per micros 0-1     Crystals, Ur, HPF, POC neg     Casts, Ur, LPF, POC neg     Yeast, UA neg    POCT URINALYSIS DIPSTICK      Result Value Range   Color, UA amber     Clarity, UA clear     Glucose, UA 100     Bilirubin, UA small     Ketones, UA trace     Spec Grav, UA >=1.030     Blood, UA moderate     pH, UA 5.5     Protein, UA 100     Urobilinogen, UA 0.2     Nitrite, UA neg     Leukocytes, UA Trace       EKG/XRAY:   Primary read interpreted by [facility]. Conley Rolls at [facility]. No acute cardiopulmonary process, unchanged from 02/2012    ASSESSMENT/PLAN: Encounter Diagnoses  Name Primary?  . Cough Yes  . Anemia   . Diabetes   . Other malaise and fatigue   . Acute pharyngitis   . URI (upper respiratory infection)   . Rhinorrhea   . UTI (urinary tract infection)    Labs pending: CMP and also urine cx He is doing meuch better after having eaten Will rx Levaquin since will cover for URI and UTI Gross sideeffects, risk and benefits, and alternatives of medications d/w patient. Patient is aware that all medications have potential sideeffects and we are unable to predict every sideeffect or drug-drug interaction  that may occur. Advise that he shoudle f/u with PCP  And cardiology for recheck of INR, UA sicne there was hematuria in 7-10 days His anemia is back to baseline Rx tessalon perles, levaquin, and also atrovent NS  More than 45 min spent with patient, more than 50% time counseling   LE, THAO PHUONG, DO 08/19/2012 1:09 PM

## 2012-08-19 NOTE — Patient Instructions (Signed)

## 2012-08-20 LAB — URINE CULTURE: Colony Count: 60000

## 2012-08-26 ENCOUNTER — Ambulatory Visit: Payer: Medicare Other | Admitting: Pharmacist Clinician (PhC)/ Clinical Pharmacy Specialist

## 2012-09-09 ENCOUNTER — Ambulatory Visit: Payer: Medicare Other | Admitting: Pharmacist Clinician (PhC)/ Clinical Pharmacy Specialist

## 2012-09-18 ENCOUNTER — Other Ambulatory Visit: Payer: Self-pay | Admitting: Family Medicine

## 2012-09-20 ENCOUNTER — Encounter: Payer: Self-pay | Admitting: Internal Medicine

## 2012-09-28 DIAGNOSIS — F0280 Dementia in other diseases classified elsewhere without behavioral disturbance: Secondary | ICD-10-CM | POA: Diagnosis not present

## 2012-09-28 DIAGNOSIS — Z950 Presence of cardiac pacemaker: Secondary | ICD-10-CM | POA: Diagnosis not present

## 2012-09-28 DIAGNOSIS — I2510 Atherosclerotic heart disease of native coronary artery without angina pectoris: Secondary | ICD-10-CM | POA: Diagnosis not present

## 2012-09-28 DIAGNOSIS — E1159 Type 2 diabetes mellitus with other circulatory complications: Secondary | ICD-10-CM | POA: Diagnosis not present

## 2012-09-28 DIAGNOSIS — M1990 Unspecified osteoarthritis, unspecified site: Secondary | ICD-10-CM | POA: Diagnosis not present

## 2012-09-28 DIAGNOSIS — I10 Essential (primary) hypertension: Secondary | ICD-10-CM | POA: Diagnosis not present

## 2012-09-28 DIAGNOSIS — D649 Anemia, unspecified: Secondary | ICD-10-CM | POA: Diagnosis not present

## 2012-09-28 DIAGNOSIS — E785 Hyperlipidemia, unspecified: Secondary | ICD-10-CM | POA: Diagnosis not present

## 2012-09-28 DIAGNOSIS — I4891 Unspecified atrial fibrillation: Secondary | ICD-10-CM | POA: Diagnosis not present

## 2012-09-29 ENCOUNTER — Telehealth: Payer: Self-pay | Admitting: Neurology

## 2012-09-29 MED ORDER — DONEPEZIL HCL 10 MG PO TABS: 20.0000 mg | ORAL_TABLET | Freq: Every day | ORAL | Status: DC

## 2012-09-29 MED ORDER — PRAMIPEXOLE DIHYDROCHLORIDE 0.125 MG PO TABS: 0.1250 mg | ORAL_TABLET | Freq: Every evening | ORAL | Status: DC

## 2012-09-29 NOTE — Telephone Encounter (Signed)
Rx's were already sent to [facility] on 07/08:  E-Prescribing Status: Receipt confirmed by pharmacy (08/17/2012 10:57 AM EDT)                Pharmacy    [facility] - [place], [place] - [address]   I will send them again .

## 2012-10-12 ENCOUNTER — Ambulatory Visit: Payer: Medicare Other | Admitting: Cardiovascular Disease

## 2012-10-13 ENCOUNTER — Encounter: Payer: Self-pay | Admitting: Cardiovascular Disease

## 2012-10-13 ENCOUNTER — Ambulatory Visit (INDEPENDENT_AMBULATORY_CARE_PROVIDER_SITE_OTHER): Payer: Medicare Other | Admitting: Cardiovascular Disease

## 2012-10-13 ENCOUNTER — Other Ambulatory Visit: Payer: Self-pay | Admitting: Cardiovascular Disease

## 2012-10-13 VITALS — BP 132/70 | HR 60 | Ht 70.0 in | Wt 197.4 lb

## 2012-10-13 DIAGNOSIS — I2510 Atherosclerotic heart disease of native coronary artery without angina pectoris: Secondary | ICD-10-CM | POA: Diagnosis not present

## 2012-10-13 DIAGNOSIS — I700 Atherosclerosis of aorta: Secondary | ICD-10-CM | POA: Diagnosis not present

## 2012-10-13 DIAGNOSIS — D509 Iron deficiency anemia, unspecified: Secondary | ICD-10-CM

## 2012-10-13 DIAGNOSIS — Z950 Presence of cardiac pacemaker: Secondary | ICD-10-CM | POA: Diagnosis not present

## 2012-10-13 DIAGNOSIS — I472 Ventricular tachycardia: Secondary | ICD-10-CM

## 2012-10-13 DIAGNOSIS — F068 Other specified mental disorders due to known physiological condition: Secondary | ICD-10-CM

## 2012-10-13 DIAGNOSIS — I4891 Unspecified atrial fibrillation: Secondary | ICD-10-CM | POA: Diagnosis not present

## 2012-10-13 LAB — PACEMAKER DEVICE OBSERVATION

## 2012-10-13 NOTE — Patient Instructions (Addendum)
Remote monitoring is used to monitor your Pacemaker of ICD from home. This monitoring reduces the number of office visits required to check your device to one time per year. It allows [place] to keep an eye on the functioning of your device to ensure it is working properly. You are scheduled for a device check from home on 01-13-2013. You may send your transmission at any time that day. If you have a wireless device, the transmission will be sent automatically. After your physician reviews your transmission, you will receive a postcard with your next transmission date.  Your physician recommends that you schedule a follow-up appointment in: 6 months

## 2012-10-14 LAB — PACEMAKER DEVICE OBSERVATION
AL AMPLITUDE: 2.7 mv
AL IMPEDENCE PM: 544 Ohm
BAMS-0001: 171 {beats}/min
BATTERY VOLTAGE: 2.99 V
RV LEAD IMPEDENCE PM: 544 Ohm
VENTRICULAR PACING PM: 0.7

## 2012-10-17 NOTE — Assessment & Plan Note (Signed)
Cardiac catheterization 2009 showed disease in small branches (80% stenosis in the inferior branch of a diagonal artery, 67% focal stenosis of the mid AV groove portion left circumflex coronary artery); asymptomatic.

## 2012-10-17 NOTE — Assessment & Plan Note (Signed)
Appears to be worsening. I'm not sure if his episodes of incontinence have to do with this neurological problem or potentially a side effect of medications such as donepezil, which accelerates intestinal transit

## 2012-10-17 NOTE — Assessment & Plan Note (Signed)
Anticoagulation discontinued secondary to bleeding complications.

## 2012-10-17 NOTE — Assessment & Plan Note (Signed)
Previous transesophageal echo has shown severe and mobile ulcerative plaque in the descending thoracic aorta. Statin therapy. Instrumentation of the aorta such as for cardiac catheterization should be undertaken with great care.

## 2012-10-17 NOTE — Assessment & Plan Note (Signed)
He had microcytic anemia and Hemoccult-positive stools without identifiable source of bleeding by endoscopic workup. Once he discontinued his warfarin his anemia has resolved completely on iron supplementation. Unfortunately, he has a history of atrial fibrillation and a previous stroke. This puts him at high risk of recurrent embolic stroke. It is hard to make a firm decision on what is the best course of anticoagulation treatment for this gentleman. For the time being we'll continue to monitor his pacemaker for any signs of atrial fibrillation (none has been detected in the last couple of years. If the prevalence of atrial fibrillation increases I would strongly recommend restarting anticoagulant therapy. For the time being he will take aspirin only.

## 2012-10-17 NOTE — Progress Notes (Signed)
Patient ID: Jon Davis, male   DOB: [birth year], 79 y.o.   MRN: [id number]      Reason for office visit Followup pacemaker, coronary artery disease, aortic atherosclerosis, hypercholesterolemia  Jon Davis has not had any cardiac problems since his last appointment. He has occasional mild ankle edema. His wife reports that he has worsening fatigue. What worries her the most is the fact that his dementia appears to have deteriorated. Also of great concern to both of them is the fact that he has had a few episodes of fecal incontinence that are difficult to explain. He has not had frank diarrhea.  He had iron deficiency anemia corrected after we stopped treatment with warfarin. The exact source of bleeding was never identified. He has now been off warfarin for several months. Happily his pacemaker has not recorded any significant recurrence of atrial fibrillation during this time.  He has had only one episode of atrial fibrillation/atrial tachycardia that lasted for 63 seconds and occurred on June 30 It should be noted that he does have a previous history of stroke that occurred long before the atrial fibrillation was never diagnosed.  Interrogation of his pacemaker also does not show any evidence of recurrence of nonsustained ventricular tachycardia, he has had occasional asymptomatic events of this. He has moderate coronary artery disease without perfusion abnormalities by functional studies and is free of angina or dyspnea.    No Known Allergies  Current Outpatient Prescriptions  Medication Sig Dispense Refill  . ALPRAZolam (XANAX) 0.25 MG tablet Take 0.25 mg by mouth 3 (three) times daily as needed for sleep.      [name] amLODipine (NORVASC) 5 MG tablet Take 5 mg by mouth daily.      [name] aspirin 81 MG tablet Take 81 mg by mouth daily.      . ferrous sulfate 325 (65 FE) MG tablet TAKE 1 TABLET BY MOUTH 3 TIMES A DAY  100 tablet  0  . glipiZIDE (GLUCOTROL XL) 5 MG 24 hr tablet Take 5 mg by  mouth daily.      [name] HYDROcodone-acetaminophen (NORCO/VICODIN) 5-325 MG per tablet Take 1 tablet by mouth every 6 (six) hours as needed for pain. Take 1 tab twice daily.      [name] ipratropium (ATROVENT) 0.03 % nasal spray PLACE 2 SPRAYS INTO THE NOSE EVERY 12 (TWELVE) HOURS.  30 mL  0  . isosorbide mononitrate (IMDUR) 60 MG 24 hr tablet Take 60 mg by mouth daily. Patient takes 1/2 tab daily      . metoprolol succinate (TOPROL-XL) 50 MG 24 hr tablet Take 50 mg by mouth daily. Take with or immediately following a meal.      . pantoprazole (PROTONIX) 40 MG tablet Take 40 mg by mouth daily.      . pramipexole (MIRAPEX) 0.125 MG tablet Take 1 tablet (0.125 mg total) by mouth every evening. Two hours before bedtime  90 tablet  1  . rosuvastatin (CRESTOR) 20 MG tablet Take 20 mg by mouth daily.      . sertraline (ZOLOFT) 25 MG tablet Take 25 mg by mouth daily.      . traMADol (ULTRAM) 50 MG tablet Take 50 mg by mouth every 8 (eight) hours as needed.      . benzonatate (TESSALON) 100 MG capsule Take 2 capsules (200 mg total) by mouth 2 (two) times daily as needed for cough.  30 capsule  1  . donepezil (ARICEPT) 10 MG tablet Take 2 tablets (20 mg  total) by mouth daily.  180 tablet  1   No current facility-administered medications for this visit.    Past Medical History  Diagnosis Date  . Diabetes   . Hypertension   . Depression   . Dementia in Alzheimer's disease   . Hyperlipidemia   . Episodic VTach, pacer     Followed by Dr. Benedict Davis  . Urinary incontinence   . Anemia 06/2012  . Elevated PSA   . Microalbuminuria   . BPH (benign prostatic hyperplasia)     Followed by Dr. Sherron Davis  . Hiatal hernia 02/26/2010  . Gastritis 02/26/2010    moderate  . Diverticulosis 02/26/2010    Sigmoid colon  . Duodenitis 02/26/2010  . Brady-tachy syndrome     04/17/2009 MDT pacer Enrhythmatr fib  . Atrial fibrillation   . CAD (coronary artery disease)     Past Surgical History  Procedure Laterality Date    . Appendectomy  1948  . Permanent pacemaker insertion  04/17/2009    MDT Enrhythm  . Cholecystectomy    . Cataracts    . Cardiac catheterization  07/07/2007    mild to mod. ca+ w/narrowing of 20% ostium of second diagonal,diffuse 80% stenosis small caliber infer. branch,20% mid LAD,60-70% narrowing mid atrioventricular groove CX,mild 20-30% RCA  . Carotid doppler  10/01/2006    0-49% right bulb,prox,ECA & prox ICA,0-49% left subclavian bulb & prox ICA,right & left ICA abn. resistance  . Nm myoview ltd  10/01/2010    No ischemia    Family History  Problem Relation Age of Onset  . Heart disease Father     Deceased  . Alzheimer's disease Mother     Deceased    History   Social History  . Marital Status: Married    Spouse Name: N/A    Number of Children: N/A  . Years of Education: N/A   Occupational History  . Not on file.   Social History Main Topics  . Smoking status: Former Games developer  . Smokeless tobacco: Former Neurosurgeon    Quit date: 02/09/1974  . Alcohol Use: No  . Drug Use: No  . Sexual Activity: No   Other Topics Concern  . Not on file   Social History Narrative  . No narrative on file    Review of systems: Very problems, fecal incontinence The patient specifically denies any chest pain at rest or with exertion, dyspnea at rest or with exertion, orthopnea, paroxysmal nocturnal dyspnea, syncope, palpitations, focal neurological deficits, intermittent claudication, lower extremity edema, unexplained weight gain, cough, hemoptysis or wheezing.  The patient also denies abdominal pain, nausea, vomiting, dysphagia, diarrhea, constipation, polyuria, polydipsia, dysuria, hematuria, frequency, urgency, abnormal bleeding or bruising, fever, chills, unexpected weight changes, mood swings, change in skin or hair texture, change in voice quality, auditory or visual problems, allergic reactions or rashes, new musculoskeletal complaints other than usual "aches and pains".   PHYSICAL  EXAM BP 132/70  Pulse 60  Ht 5\' 10"  (1.778 m)  Wt 197 lb 6.4 oz (89.54 kg)  BMI 28.32 kg/m2  General: Alert, oriented x3, no distress Head: no evidence of trauma, PERRL, EOMI, no exophtalmos or lid lag, no myxedema, no xanthelasma; normal ears, nose and oropharynx Neck: normal jugular venous pulsations and no hepatojugular reflux; brisk carotid pulses without delay and no carotid bruits Chest: clear to auscultation, no signs of consolidation by percussion or palpation, normal fremitus, symmetrical and full respiratory excursions Cardiovascular: normal position and quality of the apical impulse, regular rhythm, normal  first and second heart sounds, no murmurs, rubs or gallops Abdomen: no tenderness or distention, no masses by palpation, no abnormal pulsatility or arterial bruits, normal bowel sounds, no hepatosplenomegaly Extremities: no clubbing, cyanosis or edema; 2+ radial, ulnar and brachial pulses bilaterally; 2+ right femoral, posterior tibial and dorsalis pedis pulses; 2+ left femoral, posterior tibial and dorsalis pedis pulses; no subclavian or femoral bruits Neurological: grossly nonfocal   EKG: Atrial paced ventricular sensed  Lipid Panel     Component Value Date/Time   CHOL  Value: 119        ATP III CLASSIFICATION:  <200     mg/dL   Desirable  161-096  mg/dL   Borderline High  >=045    mg/dL   High 05/19/8117 1478   TRIG 90 07/06/2007 0140   HDL 38* 07/06/2007 0140   CHOLHDL 3.1 07/06/2007 0140   VLDL 18 07/06/2007 0140   LDLCALC  Value: 63        Total Cholesterol/HDL:CHD Risk Coronary Heart Disease Risk Table                     Men   Women  1/2 Average Risk   3.4   3.3 07/06/2007 0140    BMET    Component Value Date/Time   NA 143 08/19/2012 1151   K 3.7 08/19/2012 1151   CL 105 08/19/2012 1151   CO2 30 08/19/2012 1151   GLUCOSE 83 08/19/2012 1151   BUN 10 08/19/2012 1151   CREATININE 1.21 08/19/2012 1151   CREATININE 1.17 04/17/2009 1029   CALCIUM 8.9 08/19/2012 1151    GFRNONAA 60* 04/17/2009 1029   GFRAA  Value: >60        The eGFR has been calculated using the MDRD equation. This calculation has not been validated in all clinical situations. eGFR's persistently <60 mL/min signify possible Chronic Kidney Disease. 04/17/2009 1029     ASSESSMENT AND PLAN CAD (coronary artery disease) Cardiac catheterization 2009 showed disease in small branches (80% stenosis in the inferior branch of a diagonal artery, 67% focal stenosis of the mid AV groove portion left circumflex coronary artery); asymptomatic.  Pacemaker Medtronic Enrhythm model [id number] DR serial number PNP [id number] implanted March 2011 for sinus node dysfunction with symptomatic bradycardia as well as bradycardia due to necessary medications (beta blockers for ventricular tachycardia and atrial tachycardia and coronary disease). No significant episodes of atrial fibrillation  Or any ventricular tachycardia have been detected on device check performed today. All device parameters are within normal range. Please see the separate pacemaker check note for full interrogation details.  Atrial fibrillation Anticoagulation discontinued secondary to bleeding complications.  Aortic atherosclerosis Previous transesophageal echo has shown severe and mobile ulcerative plaque in the descending thoracic aorta. Statin therapy. Instrumentation of the aorta such as for cardiac catheterization should be undertaken with great care.  Nonsustained ventricular tachycardia Never symptomatic, detected by his pacemaker.  DEMENTIA Appears to be worsening. I'm not sure if his episodes of incontinence have to do with this neurological problem or potentially a side effect of medications such as donepezil, which accelerates intestinal transit  Microcytic anemia He had microcytic anemia and Hemoccult-positive stools without identifiable source of bleeding by endoscopic workup. Once he discontinued his warfarin his anemia has resolved  completely on iron supplementation. Unfortunately, he has a history of atrial fibrillation and a previous stroke. This puts him at high risk of recurrent embolic stroke. It is hard to make a firm decision on what is  the best course of anticoagulation treatment for this gentleman. For the time being we'll continue to monitor his pacemaker for any signs of atrial fibrillation (none has been detected in the last couple of years. If the prevalence of atrial fibrillation increases I would strongly recommend restarting anticoagulant therapy. For the time being he will take aspirin only.   No orders of the defined types were placed in this encounter.   No orders of the defined types were placed in this encounter.    Junious Silk, MD, [facility] [phone] office [phone] pager

## 2012-10-17 NOTE — Assessment & Plan Note (Signed)
Never symptomatic, detected by his pacemaker.

## 2012-10-17 NOTE — Assessment & Plan Note (Addendum)
Medtronic Enrhythm model [id number] DR serial number PNP [id number] H implanted March 2011 for sinus node dysfunction with symptomatic bradycardia as well as bradycardia due to necessary medications (beta blockers for ventricular tachycardia and atrial tachycardia and coronary disease). No significant episodes of atrial fibrillation  Or any ventricular tachycardia have been detected on device check performed today. All device parameters are within normal range. Please see the separate pacemaker check note for full interrogation details.

## 2012-10-26 DIAGNOSIS — Z23 Encounter for immunization: Secondary | ICD-10-CM | POA: Diagnosis not present

## 2012-11-08 ENCOUNTER — Other Ambulatory Visit: Payer: Self-pay | Admitting: Neurology

## 2012-11-15 ENCOUNTER — Telehealth: Payer: Self-pay | Admitting: Neurology

## 2012-11-15 ENCOUNTER — Encounter: Payer: Self-pay | Admitting: Cardiovascular Disease

## 2012-11-15 MED ORDER — PRAMIPEXOLE DIHYDROCHLORIDE 0.125 MG PO TABS: 0.1250 mg | ORAL_TABLET | Freq: Every evening | ORAL | Status: DC

## 2012-11-15 MED ORDER — DONEPEZIL HCL 10 MG PO TABS: 20.0000 mg | ORAL_TABLET | Freq: Every day | ORAL | Status: DC

## 2012-11-15 NOTE — Telephone Encounter (Signed)
Rxs sent

## 2012-11-24 ENCOUNTER — Ambulatory Visit (INDEPENDENT_AMBULATORY_CARE_PROVIDER_SITE_OTHER): Payer: Medicare Other | Admitting: Neurology

## 2012-11-24 ENCOUNTER — Encounter: Payer: Self-pay | Admitting: Neurology

## 2012-11-24 VITALS — BP 115/64 | HR 64 | Resp 16 | Ht 72.0 in | Wt 203.0 lb

## 2012-11-24 DIAGNOSIS — G4752 REM sleep behavior disorder: Secondary | ICD-10-CM | POA: Diagnosis not present

## 2012-11-24 DIAGNOSIS — G2581 Restless legs syndrome: Secondary | ICD-10-CM

## 2012-11-24 DIAGNOSIS — G319 Degenerative disease of nervous system, unspecified: Secondary | ICD-10-CM

## 2012-11-24 MED ORDER — DONEPEZIL HCL 10 MG PO TABS: 20.0000 mg | ORAL_TABLET | Freq: Every day | ORAL | Status: DC

## 2012-11-24 MED ORDER — PRAMIPEXOLE DIHYDROCHLORIDE 0.125 MG PO TABS: 0.2500 mg | ORAL_TABLET | Freq: Every evening | ORAL | Status: DC

## 2012-11-24 MED ORDER — CLONAZEPAM 0.5 MG PO TABS: 0.5000 mg | ORAL_TABLET | Freq: Every evening | ORAL | Status: DC | PRN

## 2012-11-24 NOTE — Progress Notes (Signed)
[name]  Provider:  [name], M D  Referring Provider: [name], MD Primary Care Physician:  [name], MD  Chief Complaint  Patient presents with  . 6 mo F/U    Pt was give [facility] test and results were posted on the bottom of sheet    HPI:  Jon Davis is a 79 y.o. male  Is seen here as a referral/ revisit  from [name] for memory loss.    Mr. Jon Davis was last seen by myself on 09-05-11 I had previously seen him for years earlier for diabetes and a visible in the outer agent orange provoked neuropathy he had developed a progressive gait disorder use a cane and suffered from chronic neuropathic pain. He also had an unstable gait progressively and presented as a unilateral resting tremor,  with gait changes,  with cog wheeling of the right arm and reported vivid dreams,  that were suspicious for REM behavior disorder. For the last visit he has undergone a physical therapy series of sessions for gait stabilization. He felt that he did all right the cells and has benefited. Please not that the patient had a pacemaker inserted in March 2011 for atrial fibrillation-sick sinus syndrome he also had a CT scan of the brain in 2012 which showed no abnormalities.   Review of Systems: Out of a complete 14 system review, the patient complains of only the following symptoms, and all other reviewed systems are negative. Memory lapses, neuropathy, REM BD .  History   Social History  . Marital Status: Married    Spouse Name: N/A    Number of Children: N/A  . Years of Education: N/A   Occupational History  . Not on file.   Social History Main Topics  . Smoking status: Former Games developer  . Smokeless tobacco: Former Neurosurgeon    Quit date: 02/09/1974  . Alcohol Use: No  . Drug Use: No  . Sexual Activity: No   Other Topics Concern  . Not on file   Social History Narrative  . No narrative on file    Family History  Problem Relation Age of  Onset  . Heart disease Father     Deceased  . Alzheimer's disease Mother     Deceased    Past Medical History  Diagnosis Date  . Diabetes   . Hypertension   . Depression   . Dementia in Alzheimer's disease   . Hyperlipidemia   . Episodic VTach, pacer     Followed by Dr. Benedict Needy  . Urinary incontinence   . Anemia 06/2012  . Elevated PSA   . Microalbuminuria   . BPH (benign prostatic hyperplasia)     Followed by Dr. Sherron Monday  . Hiatal hernia 02/26/2010  . Gastritis 02/26/2010    moderate  . Diverticulosis 02/26/2010    Sigmoid colon  . Duodenitis 02/26/2010  . Brady-tachy syndrome     04/17/2009 MDT pacer Enrhythmatr fib  . Atrial fibrillation   . CAD (coronary artery disease)     Past Surgical History  Procedure Laterality Date  . Appendectomy  1948  . Permanent pacemaker insertion  04/17/2009    MDT Enrhythm  . Cholecystectomy    . Cataracts    . Cardiac catheterization  07/07/2007    mild to mod. ca+ w/narrowing of 20% ostium of second diagonal,diffuse 80% stenosis small caliber infer. branch,20% mid LAD,60-70% narrowing mid atrioventricular groove CX,mild 20-30% RCA  . Carotid doppler  10/01/2006  0-49% right bulb,prox,ECA & prox ICA,0-49% left subclavian bulb & prox ICA,right & left ICA abn. resistance  . Nm myoview ltd  10/01/2010    No ischemia    Current Outpatient Prescriptions  Medication Sig Dispense Refill  . ALPRAZolam (XANAX) 0.25 MG tablet Take 0.25 mg by mouth 3 (three) times daily as needed for sleep.      [name] amLODipine (NORVASC) 5 MG tablet Take 5 mg by mouth daily.      [name] aspirin 81 MG tablet Take 81 mg by mouth daily.      [name] donepezil (ARICEPT) 10 MG tablet Take 2 tablets (20 mg total) by mouth daily.  180 tablet  0  . ferrous sulfate 325 (65 FE) MG tablet TAKE 1 TABLET BY MOUTH 1 TIME A DAY      . glipiZIDE (GLUCOTROL XL) 5 MG 24 hr tablet Take 5 mg by mouth daily.      [name] ipratropium (ATROVENT) 0.03 % nasal spray PLACE 2 SPRAYS INTO THE NOSE  EVERY 12 (TWELVE) HOURS.  30 mL  0  . isosorbide mononitrate (IMDUR) 60 MG 24 hr tablet Take 60 mg by mouth daily. Patient takes 1/2 tab daily      . metoprolol succinate (TOPROL-XL) 50 MG 24 hr tablet Take 50 mg by mouth daily. Take with or immediately following a meal.      . pantoprazole (PROTONIX) 40 MG tablet Take 40 mg by mouth daily.      . pramipexole (MIRAPEX) 0.125 MG tablet Take 1 tablet (0.125 mg total) by mouth every evening. Two hours before bedtime  90 tablet  0  . rosuvastatin (CRESTOR) 20 MG tablet Take 20 mg by mouth daily.      . sertraline (ZOLOFT) 25 MG tablet Take 25 mg by mouth daily.      . traMADol (ULTRAM) 50 MG tablet Take 50 mg by mouth every 8 (eight) hours as needed.      . benzonatate (TESSALON) 100 MG capsule Take 2 capsules (200 mg total) by mouth 2 (two) times daily as needed for cough.  30 capsule  1  . HYDROcodone-acetaminophen (NORCO/VICODIN) 5-325 MG per tablet Take 1 tablet by mouth every 6 (six) hours as needed for pain. Take 1 tab twice daily.       No current facility-administered medications for this visit.    Allergies as of 11/24/2012  . (No Known Allergies)    Vitals: BP 115/64  Pulse 64  Resp 16  Ht 6' (1.829 m)  Wt 203 lb (92.08 kg)  BMI 27.53 kg/m2 Last Weight:  Wt Readings from Last 1 Encounters:  11/24/12 203 lb (92.08 kg)   Last Height:   Ht Readings from Last 1 Encounters:  11/24/12 6' (1.829 m)    Physical exam:  General: The patient is awake, alert and appears not in acute distress. The patient is well groomed. Head: Normocephalic, atraumatic. Neck is supple. Mallampati 3 , neck circumference: 15.5  Cardiovascular:  Regular rate and rhythm , without  murmurs or carotid bruit, and without distended neck veins. Respiratory: Lungs are clear to auscultation. Skin:  Without evidence of edema, or rash Trunk: BMI is  elevated -normal posture.  Neurologic exam : The patient is awake and alert, oriented to place and time.   Memory subjective  described as impaired -23-30 MOCA - missed all 5 words on recall.   There is limites attention  & concentration ability. Speech is fluent without  dysarthria, dysphonia or aphasia. Mood  and affect are appropriate.   Cranial nerves: Pupils are equal and briskly reactive to light. Funduscopic exam without  evidence of pallor or edema. Extraocular movements  in vertical and horizontal planes intact and without nystagmus. Visual fields by finger perimetry are intact. Hearing to finger rub intact.  Facial sensation intact to fine touch. Facial motor strength is symmetric and tongue and uvula move midline.  Motor exam:   Normal tone and normal muscle bulk and symmetric normal strength in all extremities.  Sensory:  Fine touch, pinprick and vibration were tested in all extremities.  He has numbness and painful dysesthesis , worse in AM and sometimes spasms in both calves.  Proprioception is tested in the upper extremities only. This was  normal.  Coordination: Rapid alternating movements in the fingers/hands is tested and normal. Finger-to-nose maneuver tested and normal without evidence of ataxia, dysmetria or tremor.  Gait and station: Patient walks with assistive device ( cane ) for   25 years  - Strength within normal limits. Stance is stable and normal. Tandem gait is unsteady-  Steps are fragmented. Romberg testing is abnormal..  Deep tendon reflexes: in the  upper and lower extremities are symmetric and intact. Babinski maneuver response is  downgoing.   Assessment:  After physical and neurologic examination, review of laboratory studies, imaging, neurophysiology testing and pre-existing records, assessment is  #1 history of painful polyneuropathy with chronic neuropathic pain contributing to her gait disorder. Patient also has a history of diabetes which certainly plays a part in his neuropathy. He is using a cane for 25 years now he has seen physical therapy for gait  stabilization and felt this was beneficial.  #2 memory has declined also the patient seems to do especially poorly on the short term recall of words and names. His attention was not impaired his concentration ability is still intact the trail making test has been in this main challenge besides the word recall. He is fully oriented to place time and able to do even serial 7 questions. #3 the patient has restless leg syndrome with his painful neuropathy he has been prescribed all time and even Vicodin in the past which may control some of the pain as well as a neuropathy but he also reported myoclonic jerks. He is on Mirapex at this time a very very low jobs I think is most reasonable to increase the Mirapex doors and consider increasing the Xanax pills as needed as a sleep aid which may also help with the trembling.   Plan:  Treatment plan and additional workup :  Patient takes Aricept in the morning for memory support for 3 years now he still has very vivid dreams and I suspect that REM behavior disorder is present. I noticed a cognitive impairment but is not severe enough to be called dementia at this point. Short-term memory deficits are evident. Given his additional tremor ,  cogwheeling and myoclonic jerks I would like to increase the patient's Mirapex at night  And refill Aricept , and increase /replace the Xanax with Klonopin.

## 2012-11-24 NOTE — Patient Instructions (Signed)
  One of the sleep related diagnosis for this patient is REM behavior disorder which may be provoked by Aricept.   I have decided to exchange the Xanax medication  he has used as a sleep aid and replaced with his Klonopin of 0.5 mg at night  Restless Legs Syndrome Restless legs syndrome is a movement disorder. It may also be called a sensori-motor disorder.  CAUSES  No one knows what specifically causes restless legs syndrome, but it tends to run in families. It is also more common in people with low iron, in pregnancy, in people who need dialysis, and those with nerve damage (neuropathy).Some medications may make restless legs syndrome worse.Those medications include drugs to treat high blood pressure, some heart conditions, nausea, colds, allergies, and depression. SYMPTOMS Symptoms include uncomfortable sensations in the legs. These leg sensations are worse during periods of inactivity or rest. They are also worse while sitting or lying down. Individuals that have the disorder describe sensations in the legs that feel like:  Pulling.  Drawing.  Crawling.  Worming.  Boring.  Tingling.  Pins and needles.  Prickling.  Pain. The sensations are usually accompanied by an overwhelming urge to move the legs. Sudden muscle jerks may also occur. Movement provides temporary relief from the discomfort. In rare cases, the arms may also be affected. Symptoms may interfere with going to sleep (sleep onset insomnia). Restless legs syndrome may also be related to periodic limb movement disorder (PLMD). PLMD is another more common motor disorder. It also causes interrupted sleep. The symptoms from PLMD usually occur most often when you are awake. TREATMENT  Treatment for restless legs syndrome is symptomatic. This means that the symptoms are treated.   Massage and cold compresses may provide temporary relief.  Walk, stretch, or take a cold or hot bath.  Get regular exercise and a good night's  sleep.  Avoid caffeine, alcohol, nicotine, and medications that can make it worse.  Do activities that provide mental stimulation like discussions, needlework, and video games. These may be helpful if you are not able to walk or stretch. Some medications are effective in relieving the symptoms. However, many of these medications have side effects. Ask your caregiver about medications that may help your symptoms. Correcting iron deficiency may improve symptoms for some patients. Document Released: 01/17/2002 Document Revised: 04/21/2011 Document Reviewed: 04/25/2010 [facility] Patient Information 2014 [place], [place].

## 2012-12-08 ENCOUNTER — Telehealth: Payer: Self-pay | Admitting: *Deleted

## 2012-12-08 NOTE — Telephone Encounter (Signed)
Spoke with wife and she said that patient since starting the new medicine(clonazepam),is hallucinating,out of his head, lost control of bowels, unsteady on feet, falling around a lot. She has stopped the medicine and has started him back on the alprazolam,seems a little better.

## 2012-12-09 NOTE — Telephone Encounter (Signed)
No voicemail set up- only cellphone number is listed. CD

## 2012-12-09 NOTE — Telephone Encounter (Signed)
Stop clonazepam and return to previous regimen ASAP . RV with Np.

## 2012-12-10 DIAGNOSIS — I10 Essential (primary) hypertension: Secondary | ICD-10-CM | POA: Diagnosis not present

## 2012-12-10 DIAGNOSIS — D649 Anemia, unspecified: Secondary | ICD-10-CM | POA: Diagnosis not present

## 2012-12-10 DIAGNOSIS — I4891 Unspecified atrial fibrillation: Secondary | ICD-10-CM | POA: Diagnosis not present

## 2012-12-10 DIAGNOSIS — K219 Gastro-esophageal reflux disease without esophagitis: Secondary | ICD-10-CM | POA: Diagnosis not present

## 2012-12-10 DIAGNOSIS — M1990 Unspecified osteoarthritis, unspecified site: Secondary | ICD-10-CM | POA: Diagnosis not present

## 2012-12-10 DIAGNOSIS — E785 Hyperlipidemia, unspecified: Secondary | ICD-10-CM | POA: Diagnosis not present

## 2012-12-10 DIAGNOSIS — I2510 Atherosclerotic heart disease of native coronary artery without angina pectoris: Secondary | ICD-10-CM | POA: Diagnosis not present

## 2012-12-10 DIAGNOSIS — F0280 Dementia in other diseases classified elsewhere without behavioral disturbance: Secondary | ICD-10-CM | POA: Diagnosis not present

## 2012-12-13 NOTE — Telephone Encounter (Signed)
Wife already took him of Klonopin, and restarted alprazolam. He is improving. She has not getting a call back before today. She is giving him one tab at night , one half prn extra. Cd

## 2012-12-24 DIAGNOSIS — E119 Type 2 diabetes mellitus without complications: Secondary | ICD-10-CM | POA: Diagnosis not present

## 2013-01-04 DIAGNOSIS — L570 Actinic keratosis: Secondary | ICD-10-CM | POA: Diagnosis not present

## 2013-01-04 DIAGNOSIS — Z85828 Personal history of other malignant neoplasm of skin: Secondary | ICD-10-CM | POA: Diagnosis not present

## 2013-01-04 DIAGNOSIS — L821 Other seborrheic keratosis: Secondary | ICD-10-CM | POA: Diagnosis not present

## 2013-01-04 DIAGNOSIS — D239 Other benign neoplasm of skin, unspecified: Secondary | ICD-10-CM | POA: Diagnosis not present

## 2013-01-04 DIAGNOSIS — D485 Neoplasm of uncertain behavior of skin: Secondary | ICD-10-CM | POA: Diagnosis not present

## 2013-01-17 DIAGNOSIS — M1990 Unspecified osteoarthritis, unspecified site: Secondary | ICD-10-CM | POA: Diagnosis not present

## 2013-01-17 DIAGNOSIS — G4752 REM sleep behavior disorder: Secondary | ICD-10-CM | POA: Diagnosis not present

## 2013-01-17 DIAGNOSIS — D649 Anemia, unspecified: Secondary | ICD-10-CM | POA: Diagnosis not present

## 2013-01-17 DIAGNOSIS — I4891 Unspecified atrial fibrillation: Secondary | ICD-10-CM | POA: Diagnosis not present

## 2013-01-17 DIAGNOSIS — G2581 Restless legs syndrome: Secondary | ICD-10-CM | POA: Diagnosis not present

## 2013-01-17 DIAGNOSIS — E1159 Type 2 diabetes mellitus with other circulatory complications: Secondary | ICD-10-CM | POA: Diagnosis not present

## 2013-01-17 DIAGNOSIS — Z23 Encounter for immunization: Secondary | ICD-10-CM | POA: Diagnosis not present

## 2013-01-17 DIAGNOSIS — G319 Degenerative disease of nervous system, unspecified: Secondary | ICD-10-CM | POA: Diagnosis not present

## 2013-01-17 DIAGNOSIS — E785 Hyperlipidemia, unspecified: Secondary | ICD-10-CM | POA: Diagnosis not present

## 2013-02-14 ENCOUNTER — Ambulatory Visit (INDEPENDENT_AMBULATORY_CARE_PROVIDER_SITE_OTHER): Payer: Medicare Other | Admitting: *Deleted

## 2013-02-14 DIAGNOSIS — I4891 Unspecified atrial fibrillation: Secondary | ICD-10-CM

## 2013-02-14 LAB — MDC_IDC_ENUM_SESS_TYPE_INCLINIC
Brady Statistic AP VP Percent: 3.1 %
Brady Statistic AP VS Percent: 90.3 %
Brady Statistic AS VP Percent: 0.2 %
Brady Statistic AS VS Percent: 6.5 %
Lead Channel Pacing Threshold Amplitude: 1 V
Lead Channel Pacing Threshold Pulse Width: 0.4 ms
Lead Channel Sensing Intrinsic Amplitude: 3.1 mV
Lead Channel Sensing Intrinsic Amplitude: 9.9 mV
Lead Channel Setting Pacing Amplitude: 2 V
Lead Channel Setting Pacing Amplitude: 2 V
Lead Channel Setting Pacing Pulse Width: 0.4 ms
MDC IDC MSMT BATTERY VOLTAGE: 2.99 V
MDC IDC MSMT LEADCHNL RA IMPEDANCE VALUE: 528 Ohm
MDC IDC MSMT LEADCHNL RV IMPEDANCE VALUE: 544 Ohm
MDC IDC MSMT LEADCHNL RV PACING THRESHOLD AMPLITUDE: 1 V
MDC IDC MSMT LEADCHNL RV PACING THRESHOLD PULSEWIDTH: 0.4 ms
MDC IDC SET LEADCHNL RV SENSING SENSITIVITY: 0.9 mV

## 2013-02-14 LAB — PACEMAKER DEVICE OBSERVATION

## 2013-02-14 NOTE — Patient Instructions (Signed)
Your physician recommends that you schedule a follow-up appointment in: 3 months with Dr.Croitoru + pacemaker check.  

## 2013-02-16 NOTE — Progress Notes (Signed)
Pacemaker check in clinic. Normal device function. Thresholds, sensing, impedances consistent with previous measurements. Device programmed to maximize longevity. No mode switches. 3 NSVT episodes---max dur. 8 bts---max V 210. RV output increased to 2.5V for greater safety margin. Histogram distribution appropriate for patient activity level. Device programmed to optimize intrinsic conduction. Batt voltage 2.99V (ERI 2.81V). Patient will follow up with [facility] in 3 months.

## 2013-03-10 DIAGNOSIS — R0602 Shortness of breath: Secondary | ICD-10-CM | POA: Diagnosis not present

## 2013-03-10 DIAGNOSIS — Z6831 Body mass index (BMI) 31.0-31.9, adult: Secondary | ICD-10-CM | POA: Diagnosis not present

## 2013-03-10 DIAGNOSIS — K222 Esophageal obstruction: Secondary | ICD-10-CM | POA: Diagnosis not present

## 2013-04-04 ENCOUNTER — Other Ambulatory Visit: Payer: Self-pay | Admitting: Cardiovascular Disease

## 2013-04-04 NOTE — Telephone Encounter (Signed)
E sentrx 

## 2013-04-28 ENCOUNTER — Ambulatory Visit (INDEPENDENT_AMBULATORY_CARE_PROVIDER_SITE_OTHER): Payer: Medicare Other | Admitting: Cardiovascular Disease

## 2013-04-28 ENCOUNTER — Encounter: Payer: Self-pay | Admitting: Cardiovascular Disease

## 2013-04-28 VITALS — BP 130/62 | HR 68 | Resp 16 | Ht 71.0 in | Wt 211.5 lb

## 2013-04-28 DIAGNOSIS — Z950 Presence of cardiac pacemaker: Secondary | ICD-10-CM

## 2013-04-28 DIAGNOSIS — I472 Ventricular tachycardia: Secondary | ICD-10-CM | POA: Diagnosis not present

## 2013-04-28 DIAGNOSIS — E785 Hyperlipidemia, unspecified: Secondary | ICD-10-CM

## 2013-04-28 DIAGNOSIS — Z79899 Other long term (current) drug therapy: Secondary | ICD-10-CM | POA: Diagnosis not present

## 2013-04-28 DIAGNOSIS — I2510 Atherosclerotic heart disease of native coronary artery without angina pectoris: Secondary | ICD-10-CM | POA: Diagnosis not present

## 2013-04-28 DIAGNOSIS — I4891 Unspecified atrial fibrillation: Secondary | ICD-10-CM

## 2013-04-28 DIAGNOSIS — I4729 Other ventricular tachycardia: Secondary | ICD-10-CM | POA: Diagnosis not present

## 2013-04-28 DIAGNOSIS — I700 Atherosclerosis of aorta: Secondary | ICD-10-CM

## 2013-04-28 LAB — MDC_IDC_ENUM_SESS_TYPE_INCLINIC
Battery Voltage: 3 V
Brady Statistic AP VP Percent: 0.79 %
Brady Statistic AP VS Percent: 83.84 %
Brady Statistic AS VP Percent: 0.14 %
Brady Statistic RA Percent Paced: 84.63 %
Date Time Interrogation Session: 20150319145501
Lead Channel Impedance Value: 512 Ohm
Lead Channel Pacing Threshold Pulse Width: 0.4 ms
Lead Channel Sensing Intrinsic Amplitude: 2.9244
Lead Channel Sensing Intrinsic Amplitude: 8.1869
Lead Channel Setting Pacing Amplitude: 2 V
Lead Channel Setting Pacing Pulse Width: 0.4 ms
Lead Channel Setting Sensing Sensitivity: 0.9 mV
MDC IDC MSMT LEADCHNL RA PACING THRESHOLD AMPLITUDE: 1 V
MDC IDC MSMT LEADCHNL RV IMPEDANCE VALUE: 512 Ohm
MDC IDC MSMT LEADCHNL RV PACING THRESHOLD AMPLITUDE: 1 V
MDC IDC MSMT LEADCHNL RV PACING THRESHOLD PULSEWIDTH: 0.4 ms
MDC IDC SET LEADCHNL RV PACING AMPLITUDE: 2.5 V
MDC IDC SET ZONE DETECTION INTERVAL: 400 ms
MDC IDC STAT BRADY AS VS PERCENT: 15.23 %
MDC IDC STAT BRADY RV PERCENT PACED: 0.93 %
Zone Setting Detection Interval: 350 ms

## 2013-04-28 LAB — PACEMAKER DEVICE OBSERVATION

## 2013-04-28 NOTE — Patient Instructions (Signed)
Remote monitoring is used to monitor your pacemaker from home. This monitoring reduces the number of office visits required to check your device to one time per year. It allows [place] to monitor the functioning of your device to ensure it is working properly. You are scheduled for a device check from home on 08-01-2013. You may send your transmission at any time that day. If you have a wireless device, the transmission will be sent automatically. After your physician reviews your transmission, you will receive a postcard with your next transmission date.  Your physician recommends that you return for lab work when you are fasting.   Your physician recommends that you schedule a follow-up appointment in: 6 months with Dr.Croitoru

## 2013-05-01 ENCOUNTER — Encounter: Payer: Self-pay | Admitting: Cardiovascular Disease

## 2013-05-01 NOTE — Assessment & Plan Note (Signed)
Asymptomatic. Continue beta blockers.

## 2013-05-01 NOTE — Progress Notes (Signed)
Patient ID: Jon Davis, male   DOB: 08-26-1926, 80 y.o.   MRN: [id number] \     Reason for office visit atrial fibrillation, pacemaker check, CAD  Jon Davis has no cardiovascular complaints and is generally doing well. He is more concerned about his wife who has been falling a lot recently. He has not had any recent falls although this has been a past problem. He denies angina pectoris. He does not have shortness of breath. He has not had syncope, presyncope or palpitations.  Jon Davis has known coronary disease with stenoses and secondary branches and no evidence of ischemia by nuclear stress test in August of 2012. His device has recorded atrial fibrillation in the past, but none in the last 2 years. He has had recurrent problems with iron deficiency anemia and for this reason is currently taking only aspirin for prophylaxis rather than warfarin or another anticoagulant. He has severe aortic atherosclerosis with ulcerated mobile plaque demonstrated in the descending thoracic aorta by previous TEE. He has well-controlled type 2 diabetes mellitus and hyperlipidemia on statin therapy. He received a dual-chamber permanent pacemaker in 2011 for symptomatic sinus bradycardia. Both before and after pacemaker implantation he has had occasional brief episodes of nonsustained ventricular tachycardia and atrial tachycardia, requiring beta blocker therapy.  Interrogation of his pacemaker today shows 2 episodes of nonsustained ventricular tachycardia lasting up to 12 beats in duration with a rate up to 222 beats per minute. There is no atrial fibrillation. There is roughly 85% atrial pacing and virtually no ventricular pacing. Pacemaker function is normal.   No Known Allergies  Current Outpatient Prescriptions  Medication Sig Dispense Refill  . amLODipine (NORVASC) 5 MG tablet Take 5 mg by mouth daily.      [name] aspirin 81 MG tablet Take 81 mg by mouth daily.      . benzonatate (TESSALON) 100 MG capsule  Take 2 capsules (200 mg total) by mouth 2 (two) times daily as needed for cough.  30 capsule  1  . clonazePAM (KLONOPIN) 0.5 MG tablet Take 1 tablet (0.5 mg total) by mouth at bedtime as needed for anxiety.  90 tablet  3  . donepezil (ARICEPT) 10 MG tablet Take 2 tablets (20 mg total) by mouth daily.  180 tablet  3  . ferrous sulfate 325 (65 FE) MG tablet TAKE 1 TABLET BY MOUTH 1 TIME A DAY      . glipiZIDE (GLUCOTROL XL) 5 MG 24 hr tablet Take 5 mg by mouth daily.      [name] HYDROcodone-acetaminophen (NORCO/VICODIN) 5-325 MG per tablet Take 1 tablet by mouth every 6 (six) hours as needed for pain. Take 1 tab twice daily.      [name] ipratropium (ATROVENT) 0.03 % nasal spray PLACE 2 SPRAYS INTO THE NOSE EVERY 12 (TWELVE) HOURS.  30 mL  0  . isosorbide mononitrate (IMDUR) 60 MG 24 hr tablet Take 60 mg by mouth daily. Patient takes 1/2 tab daily      . metoprolol succinate (TOPROL-XL) 50 MG 24 hr tablet TAKE 1 TABLET DAILY  90 tablet  3  . pantoprazole (PROTONIX) 40 MG tablet Take 40 mg by mouth daily.      . pramipexole (MIRAPEX) 0.125 MG tablet Take 2 tablets (0.25 mg total) by mouth every evening. Two hours before bedtime  90 tablet  3  . rosuvastatin (CRESTOR) 20 MG tablet Take 20 mg by mouth daily.      . sertraline (ZOLOFT) 25 MG tablet Take 25  mg by mouth daily.      . traMADol (ULTRAM) 50 MG tablet Take 50 mg by mouth every 8 (eight) hours as needed.       No current facility-administered medications for this visit.    Past Medical History  Diagnosis Date  . Diabetes   . Hypertension   . Depression   . Dementia in Alzheimer's disease   . Hyperlipidemia   . Episodic VTach, pacer     Followed by Dr. Avon Gully  . Urinary incontinence   . Anemia 06/2012  . Elevated PSA   . Microalbuminuria   . BPH (benign prostatic hyperplasia)     Followed by Dr. Matilde Sprang  . Hiatal hernia 02/26/2010  . Gastritis 02/26/2010    moderate  . Diverticulosis 02/26/2010    Sigmoid colon  . Duodenitis  02/26/2010  . Brady-tachy syndrome     04/17/2009 MDT pacer Enrhythmatr fib  . Atrial fibrillation   . CAD (coronary artery disease)     Past Surgical History  Procedure Laterality Date  . Appendectomy  1948  . Permanent pacemaker insertion  04/17/2009    MDT Enrhythm  . Cholecystectomy    . Cataracts    . Cardiac catheterization  07/07/2007    mild to mod. ca+ w/narrowing of 20% ostium of second diagonal,diffuse 80% stenosis small caliber infer. branch,20% mid LAD,60-70% narrowing mid atrioventricular groove CX,mild 20-30% RCA  . Carotid doppler  10/01/2006    0-49% right bulb,prox,ECA & prox ICA,0-49% left subclavian bulb & prox ICA,right & left ICA abn. resistance  . Nm myoview ltd  10/01/2010    No ischemia    Family History  Problem Relation Age of Onset  . Heart disease Father     Deceased  . Alzheimer's disease Mother     Deceased    History   Social History  . Marital Status: Married    Spouse Name: N/A    Number of Children: N/A  . Years of Education: N/A   Occupational History  . Not on file.   Social History Main Topics  . Smoking status: Former Research scientist (life sciences)  . Smokeless tobacco: Former Systems developer    Quit date: 02/09/1974  . Alcohol Use: No  . Drug Use: No  . Sexual Activity: No   Other Topics Concern  . Not on file   Social History Narrative  . No narrative on file    Review of systems: The patient specifically denies any chest pain at rest or with exertion, dyspnea at rest or with exertion, orthopnea, paroxysmal nocturnal dyspnea, syncope, palpitations, focal neurological deficits, intermittent claudication, lower extremity edema, unexplained weight gain, cough, hemoptysis or wheezing.  The patient also denies abdominal pain, nausea, vomiting, dysphagia, diarrhea, constipation, polyuria, polydipsia, dysuria, hematuria, frequency, urgency, abnormal bleeding or bruising, fever, chills, unexpected weight changes, mood swings, change in skin or hair texture, change in  voice quality, auditory or visual problems, allergic reactions or rashes, new musculoskeletal complaints other than usual "aches and pains".   PHYSICAL EXAM BP 130/62  Pulse 68  Resp 16  Ht _0  (1.803 m)  Wt 95.936 kg (211 lb 8 oz)  BMI 29.51 kg/m2  General: Alert, oriented x3, no distress Head: no evidence of trauma, PERRL, EOMI, no exophtalmos or lid lag, no myxedema, no xanthelasma; normal ears, nose and oropharynx Neck: normal jugular venous pulsations and no hepatojugular reflux; brisk carotid pulses without delay and no carotid bruits Chest: clear to auscultation, no signs of consolidation by percussion or palpation,  normal fremitus, symmetrical and full respiratory excursions Cardiovascular: normal position and quality of the apical impulse, regular rhythm, normal first and second heart sounds, no murmurs, rubs or gallops Abdomen: no tenderness or distention, no masses by palpation, no abnormal pulsatility or arterial bruits, normal bowel sounds, no hepatosplenomegaly Extremities: no clubbing, cyanosis or edema; 2+ radial, ulnar and brachial pulses bilaterally; 2+ right femoral, posterior tibial and dorsalis pedis pulses; 2+ left femoral, posterior tibial and dorsalis pedis pulses; no subclavian or femoral bruits Neurological: grossly nonfocal   EKG: Atrial paced ventricular sensed, left anterior fascicular block. A single PVC is seen  Lipid Panel  To be drawn today  BMET    Component Value Date/Time   NA 143 08/19/2012 1151   K 3.7 08/19/2012 1151   CL 105 08/19/2012 1151   CO2 30 08/19/2012 1151   GLUCOSE 83 08/19/2012 1151   BUN 10 08/19/2012 1151   CREATININE 1.21 08/19/2012 1151   CREATININE 1.17 04/17/2009 1029   CALCIUM 8.9 08/19/2012 1151   GFRNONAA 60* 04/17/2009 1029   GFRAA  Value: >60        The eGFR has been calculated using the MDRD equation. This calculation has not been validated in all clinical situations. eGFR's persistently <60 mL/min signify possible Chronic  Kidney Disease. 04/17/2009 1029     ASSESSMENT AND PLAN Aortic atherosclerosis Severe,ulcerated plaque, Continue statin.  Atrial fibrillation Almost 3 years since last attack event. Continue aspirin. Previous treatment with warfarin was associated with intractable iron deficiency anemia.  CAD (coronary artery disease) Asymptomatic. No ischemia but the most recent nuclear perfusion study. Cardiac catheterization should be performed via radial approach to minimize the risk of cholesterol embolization from his aortic atherosclerosis  Nonsustained ventricular tachycardia Asymptomatic. Continue beta blockers.  Pacemaker Comprehensive check shows normal device function. No reprogramming changes today. Medtronic Enrhythm model [id number] DR may have issues with rapid battery depletion towards the end of its life span.  HYPERLIPIDEMIA Ordered labs today   Orders Placed This Encounter  Procedures  . Comp Met (CMET)  . Lipid Profile  . Implantable device check  . EKG 12-Lead   No orders of the defined types were placed in this encounter.    Holli Humbles, MD, [place] [phone] office [phone] pager

## 2013-05-01 NOTE — Assessment & Plan Note (Signed)
Almost 3 years since last attack event. Continue aspirin. Previous treatment with warfarin was associated with intractable iron deficiency anemia.

## 2013-05-01 NOTE — Assessment & Plan Note (Signed)
Ordered labs today.

## 2013-05-01 NOTE — Assessment & Plan Note (Signed)
Severe,ulcerated plaque, Continue statin.

## 2013-05-01 NOTE — Assessment & Plan Note (Signed)
Asymptomatic. No ischemia but the most recent nuclear perfusion study. Cardiac catheterization should be performed via radial approach to minimize the risk of cholesterol embolization from his aortic atherosclerosis

## 2013-05-01 NOTE — Assessment & Plan Note (Signed)
Comprehensive check shows normal device function. No reprogramming changes today. Medtronic Enrhythm model [id number] DR may have issues with rapid battery depletion towards the end of its life span.

## 2013-05-26 ENCOUNTER — Ambulatory Visit (INDEPENDENT_AMBULATORY_CARE_PROVIDER_SITE_OTHER): Payer: Medicare Other | Admitting: Nurse Practitioner

## 2013-05-26 ENCOUNTER — Encounter: Payer: Self-pay | Admitting: Nurse Practitioner

## 2013-05-26 VITALS — BP 126/68 | HR 74 | Ht 69.0 in | Wt 215.0 lb

## 2013-05-26 DIAGNOSIS — G2581 Restless legs syndrome: Secondary | ICD-10-CM

## 2013-05-26 DIAGNOSIS — G319 Degenerative disease of nervous system, unspecified: Secondary | ICD-10-CM | POA: Diagnosis not present

## 2013-05-26 DIAGNOSIS — I2510 Atherosclerotic heart disease of native coronary artery without angina pectoris: Secondary | ICD-10-CM | POA: Diagnosis not present

## 2013-05-26 NOTE — Patient Instructions (Signed)
Continue Mirapex at current dose does not need refills Continue Clonazepam at current dose, does not need refills Continue Aricept at current dose, does not need refills RV 6 months

## 2013-05-26 NOTE — Progress Notes (Signed)
[name]  PATIENT: Jon Davis DOB: 22-Dec-1926   REASON FOR VISIT: Followup for REM sleep behavior disorder, restless legs and cognitive impairment    HISTORY OF PRESENT ILLNESS: Mr. Reffett, 80 year old male returns for followup. He was initially evaluated for memory loss by Dr. Brett Fairy 11/24/2012. He continues to have short-term memory problems. He also has restless leg syndrome is under better control with increasing his Mirapex.  Xanax was switched to Clonazepam  which has been better for control of his REM sleep behavior disorder. He is ambulating with a cane, he denies any falls. He is alone at today's visit. He denies any side effects to his Aricept or other medications. He has not had compulsive behaviors on Mirapex, he returns for reevaluation   HISTORY: for memory loss.  Mr. Ilona Sorrel was last seen by myself on 09-05-11 I had previously seen him for years earlier for diabetes and a visible in the outer agent orange provoked neuropathy he had developed a progressive gait disorder use a cane and suffered from chronic neuropathic pain. He also had an unstable gait progressively and presented as a unilateral resting tremor, with gait changes, with cog wheeling of the right arm and reported vivid dreams, that were suspicious for REM behavior disorder.  For the last visit he has undergone a physical therapy series of sessions for gait stabilization. He felt that he did all right the cells and has benefited. Please not that the patient had a pacemaker inserted in March 2011 for atrial fibrillation-sick sinus syndrome he also had a CT scan of the brain in 2012 which showed no abnormalities.   REVIEW OF SYSTEMS: Full 14 system review of systems performed and notable only for those listed, all others are neg:  Constitutional: N/A  Cardiovascular: N/A  Ear/Nose/Throat: N/A  Skin: N/A  Eyes: N/A  Respiratory: N/A  Gastroitestinal: N/A  Hematology/Lymphatic: N/A    Endocrine: N/A Musculoskeletal:N/A  Allergy/Immunology: N/A  Neurological: Memory loss Psychiatric: N/A Sleep restless legs   ALLERGIES: No Known Allergies  HOME MEDICATIONS: Outpatient Prescriptions Prior to Visit  Medication Sig Dispense Refill  . amLODipine (NORVASC) 5 MG tablet Take 5 mg by mouth daily.      [name] aspirin 81 MG tablet Take 81 mg by mouth daily.      . benzonatate (TESSALON) 100 MG capsule Take 2 capsules (200 mg total) by mouth 2 (two) times daily as needed for cough.  30 capsule  1  . clonazePAM (KLONOPIN) 0.5 MG tablet Take 1 tablet (0.5 mg total) by mouth at bedtime as needed for anxiety.  90 tablet  3  . donepezil (ARICEPT) 10 MG tablet Take 2 tablets (20 mg total) by mouth daily.  180 tablet  3  . ferrous sulfate 325 (65 FE) MG tablet TAKE 1 TABLET BY MOUTH 1 TIME A DAY      . glipiZIDE (GLUCOTROL XL) 5 MG 24 hr tablet Take 5 mg by mouth daily.      [name] HYDROcodone-acetaminophen (NORCO/VICODIN) 5-325 MG per tablet Take 1 tablet by mouth every 6 (six) hours as needed for pain. Take 1 tab twice daily.      [name] ipratropium (ATROVENT) 0.03 % nasal spray PLACE 2 SPRAYS INTO THE NOSE EVERY 12 (TWELVE) HOURS.  30 mL  0  . isosorbide mononitrate (IMDUR) 60 MG 24 hr tablet Take 60 mg by mouth daily. Patient takes 1/2 tab daily      . metoprolol succinate (TOPROL-XL) 50 MG 24 hr tablet  TAKE 1 TABLET DAILY  90 tablet  3  . pantoprazole (PROTONIX) 40 MG tablet Take 40 mg by mouth daily.      . pramipexole (MIRAPEX) 0.125 MG tablet Take 2 tablets (0.25 mg total) by mouth every evening. Two hours before bedtime  90 tablet  3  . rosuvastatin (CRESTOR) 20 MG tablet Take 20 mg by mouth daily.      . sertraline (ZOLOFT) 25 MG tablet Take 25 mg by mouth daily.      . traMADol (ULTRAM) 50 MG tablet Take 50 mg by mouth every 8 (eight) hours as needed.       No facility-administered medications prior to visit.    PAST MEDICAL HISTORY: Past Medical History  Diagnosis Date  .  Diabetes   . Hypertension   . Depression   . Dementia in Alzheimer's disease   . Hyperlipidemia   . Episodic VTach, pacer     Followed by Dr. Avon Gully  . Urinary incontinence   . Anemia 06/2012  . Elevated PSA   . Microalbuminuria   . BPH (benign prostatic hyperplasia)     Followed by Dr. Matilde Sprang  . Hiatal hernia 02/26/2010  . Gastritis 02/26/2010    moderate  . Diverticulosis 02/26/2010    Sigmoid colon  . Duodenitis 02/26/2010  . Brady-tachy syndrome     04/17/2009 MDT pacer Enrhythmatr fib  . Atrial fibrillation   . CAD (coronary artery disease)     PAST SURGICAL HISTORY: Past Surgical History  Procedure Laterality Date  . Appendectomy  1948  . Permanent pacemaker insertion  04/17/2009    MDT Enrhythm  . Cholecystectomy    . Cataracts    . Cardiac catheterization  07/07/2007    mild to mod. ca+ w/narrowing of 20% ostium of second diagonal,diffuse 80% stenosis small caliber infer. branch,20% mid LAD,60-70% narrowing mid atrioventricular groove CX,mild 20-30% RCA  . Carotid doppler  10/01/2006    0-49% right bulb,prox,ECA & prox ICA,0-49% left subclavian bulb & prox ICA,right & left ICA abn. resistance  . Nm myoview ltd  10/01/2010    No ischemia    FAMILY HISTORY: Family History  Problem Relation Age of Onset  . Heart disease Father     Deceased  . Alzheimer's disease Mother     Deceased    SOCIAL HISTORY: History   Social History  . Marital Status: Married    Spouse Name: [name]     Number of Children: 2  . Years of Education: 12   Occupational History  .    . retired    Social History Main Topics  . Smoking status: Former Research scientist (life sciences)  . Smokeless tobacco: Former Systems developer    Quit date: 02/09/1974  . Alcohol Use: No  . Drug Use: No  . Sexual Activity: No   Other Topics Concern  . Not on file   Social History Narrative   Patient lives at home with wife [name].    Patient has 2 children.    Patient has a high school education.    Patient retired.     Patient is right handed.      PHYSICAL EXAM  Filed Vitals:   05/26/13 1400  BP: 126/68  Pulse: 74  Height: 5' 9"  (1.753 m)  Weight: 215 lb (97.523 kg)   Body mass index is 31.74 kg/(m^2).  Generalized: Well developed, in no acute distress  Head: normocephalic and atraumatic,. Oropharynx benign  Neck: Supple, no carotid bruits  Cardiac: Regular rate rhythm, no  murmur  Musculoskeletal: No deformity   Neurological examination   Mentation: Alert oriented to time, place, history taking. MOCA 22/30. AFT 11.Follows all commands speech and language fluent  Cranial nerve II-XII: Pupils were equal round reactive to light extraocular movements were full, visual field were full on confrontational test. Facial sensation and strength were normal. hearing was intact to finger rubbing bilaterally. Uvula tongue midline. head turning and shoulder shrug were normal and symmetric.Tongue protrusion into cheek strength was normal. Motor: normal bulk and tone, full strength in the BUE, BLE, fine finger movements normal, no pronator drift. No focal weakness Sensory: normal and symmetric to light touch, pinprick, and  vibration  Coordination: finger-nose-finger, heel-to-shin bilaterally, no dysmetria Reflexes: Brachioradialis 2/2, biceps 2/2, triceps 2/2, patellar 2/2, Achilles 2/2, plantar responses were flexor bilaterally. Gait and Station: Rising up from seated position without assistance, normal stance,  moderate stride,  smooth turning,  Tandem gait is unsteady. Ambulates with single-point cane  DIAGNOSTIC DATA (LABS, IMAGING, TESTING) - I reviewed patient records, labs, notes, testing and imaging myself where available.  Lab Results  Component Value Date   WBC 6.7 08/19/2012   HGB 13.0* 08/19/2012   HCT 41.7* 08/19/2012   MCV 88.7 08/19/2012   PLT 300.0 06/29/2012      Component Value Date/Time   NA 143 08/19/2012 1151   K 3.7 08/19/2012 1151   CL 105 08/19/2012 1151   CO2 30 08/19/2012 1151    GLUCOSE 83 08/19/2012 1151   BUN 10 08/19/2012 1151   CREATININE 1.21 08/19/2012 1151   CREATININE 1.17 04/17/2009 1029   CALCIUM 8.9 08/19/2012 1151   PROT 6.9 08/19/2012 1151   ALBUMIN 4.1 08/19/2012 1151   AST 19 08/19/2012 1151   ALT 13 08/19/2012 1151   ALKPHOS 62 08/19/2012 1151   BILITOT 0.4 08/19/2012 1151   GFRNONAA 60* 04/17/2009 1029   GFRAA  Value: >60        The eGFR has been calculated using the MDRD equation. This calculation has not been validated in all clinical situations. eGFR's persistently <60 mL/min signify possible Chronic Kidney Disease. 04/17/2009 1029        ASSESSMENT AND PLAN  80 y.o. year old male  has a past medical history of memory loss over retroflex syndrome, and REM sleep disorder   Continue Mirapex at current dose does not need refills Continue Clonazepam at current dose, does not need refills Continue Aricept at current dose, does not need refills RV 6 months Dennie Bible, [facility], [facility], APRN  [facility] [address] [place], [place] [address] [phone]

## 2013-05-27 NOTE — Progress Notes (Signed)
I agree with the assessment and plan as directed by NP .The patient is known to me .   Geraldean Walen, MD  

## 2013-05-30 NOTE — Progress Notes (Signed)
I agree with the assessment and plan as directed by NP .The patient is known to me .   Raechelle Sarti, MD  

## 2013-07-08 DIAGNOSIS — I2510 Atherosclerotic heart disease of native coronary artery without angina pectoris: Secondary | ICD-10-CM | POA: Diagnosis not present

## 2013-07-08 DIAGNOSIS — D649 Anemia, unspecified: Secondary | ICD-10-CM | POA: Diagnosis not present

## 2013-07-08 DIAGNOSIS — I10 Essential (primary) hypertension: Secondary | ICD-10-CM | POA: Diagnosis not present

## 2013-07-08 DIAGNOSIS — I4891 Unspecified atrial fibrillation: Secondary | ICD-10-CM | POA: Diagnosis not present

## 2013-07-08 DIAGNOSIS — K219 Gastro-esophageal reflux disease without esophagitis: Secondary | ICD-10-CM | POA: Diagnosis not present

## 2013-07-08 DIAGNOSIS — G309 Alzheimer's disease, unspecified: Secondary | ICD-10-CM | POA: Diagnosis not present

## 2013-07-08 DIAGNOSIS — E1159 Type 2 diabetes mellitus with other circulatory complications: Secondary | ICD-10-CM | POA: Diagnosis not present

## 2013-07-08 DIAGNOSIS — M1990 Unspecified osteoarthritis, unspecified site: Secondary | ICD-10-CM | POA: Diagnosis not present

## 2013-07-08 DIAGNOSIS — Z79899 Other long term (current) drug therapy: Secondary | ICD-10-CM | POA: Diagnosis not present

## 2013-07-08 DIAGNOSIS — E785 Hyperlipidemia, unspecified: Secondary | ICD-10-CM | POA: Diagnosis not present

## 2013-07-08 DIAGNOSIS — F0280 Dementia in other diseases classified elsewhere without behavioral disturbance: Secondary | ICD-10-CM | POA: Diagnosis not present

## 2013-07-25 DIAGNOSIS — Z8546 Personal history of malignant neoplasm of prostate: Secondary | ICD-10-CM | POA: Diagnosis not present

## 2013-07-25 DIAGNOSIS — E785 Hyperlipidemia, unspecified: Secondary | ICD-10-CM | POA: Diagnosis not present

## 2013-07-25 DIAGNOSIS — I2510 Atherosclerotic heart disease of native coronary artery without angina pectoris: Secondary | ICD-10-CM | POA: Diagnosis not present

## 2013-07-25 DIAGNOSIS — Z125 Encounter for screening for malignant neoplasm of prostate: Secondary | ICD-10-CM | POA: Diagnosis not present

## 2013-07-25 DIAGNOSIS — R972 Elevated prostate specific antigen [PSA]: Secondary | ICD-10-CM | POA: Diagnosis not present

## 2013-07-25 DIAGNOSIS — Z79899 Other long term (current) drug therapy: Secondary | ICD-10-CM | POA: Diagnosis not present

## 2013-07-27 DIAGNOSIS — F0280 Dementia in other diseases classified elsewhere without behavioral disturbance: Secondary | ICD-10-CM | POA: Diagnosis not present

## 2013-07-27 DIAGNOSIS — I4891 Unspecified atrial fibrillation: Secondary | ICD-10-CM | POA: Diagnosis not present

## 2013-07-27 DIAGNOSIS — I10 Essential (primary) hypertension: Secondary | ICD-10-CM | POA: Diagnosis not present

## 2013-07-27 DIAGNOSIS — M1990 Unspecified osteoarthritis, unspecified site: Secondary | ICD-10-CM | POA: Diagnosis not present

## 2013-07-27 DIAGNOSIS — Z0000 Encounter for general adult medical examination without abnormal findings: Secondary | ICD-10-CM | POA: Diagnosis not present

## 2013-07-27 DIAGNOSIS — E785 Hyperlipidemia, unspecified: Secondary | ICD-10-CM | POA: Diagnosis not present

## 2013-07-27 DIAGNOSIS — I2510 Atherosclerotic heart disease of native coronary artery without angina pectoris: Secondary | ICD-10-CM | POA: Diagnosis not present

## 2013-07-27 DIAGNOSIS — G309 Alzheimer's disease, unspecified: Secondary | ICD-10-CM | POA: Diagnosis not present

## 2013-07-27 DIAGNOSIS — K219 Gastro-esophageal reflux disease without esophagitis: Secondary | ICD-10-CM | POA: Diagnosis not present

## 2013-08-01 ENCOUNTER — Encounter: Payer: Medicare Other | Admitting: *Deleted

## 2013-08-01 ENCOUNTER — Telehealth: Payer: Self-pay | Admitting: Cardiology

## 2013-08-01 NOTE — Telephone Encounter (Signed)
Spoke with pt and reminded pt of remote transmission that is due today. Pt verbalized understanding.   

## 2013-08-03 ENCOUNTER — Encounter: Payer: Self-pay | Admitting: Cardiology

## 2013-08-17 ENCOUNTER — Encounter: Payer: Self-pay | Admitting: *Deleted

## 2013-09-05 ENCOUNTER — Emergency Department (HOSPITAL_COMMUNITY)
Admission: EM | Admit: 2013-09-05 | Discharge: 2013-09-06 | Disposition: A | Discharge status: Home / Self Care | Payer: Medicare Other | Attending: Emergency Medicine | Admitting: Emergency Medicine

## 2013-09-05 ENCOUNTER — Emergency Department (HOSPITAL_COMMUNITY): Payer: Medicare Other

## 2013-09-05 ENCOUNTER — Encounter (HOSPITAL_COMMUNITY): Payer: Self-pay | Admitting: Emergency Medicine

## 2013-09-05 DIAGNOSIS — E785 Hyperlipidemia, unspecified: Secondary | ICD-10-CM | POA: Diagnosis not present

## 2013-09-05 DIAGNOSIS — I2510 Atherosclerotic heart disease of native coronary artery without angina pectoris: Secondary | ICD-10-CM | POA: Insufficient documentation

## 2013-09-05 DIAGNOSIS — F0280 Dementia in other diseases classified elsewhere without behavioral disturbance: Secondary | ICD-10-CM | POA: Insufficient documentation

## 2013-09-05 DIAGNOSIS — Z87891 Personal history of nicotine dependence: Secondary | ICD-10-CM | POA: Diagnosis not present

## 2013-09-05 DIAGNOSIS — Z85828 Personal history of other malignant neoplasm of skin: Secondary | ICD-10-CM | POA: Diagnosis not present

## 2013-09-05 DIAGNOSIS — E119 Type 2 diabetes mellitus without complications: Secondary | ICD-10-CM | POA: Insufficient documentation

## 2013-09-05 DIAGNOSIS — Z8719 Personal history of other diseases of the digestive system: Secondary | ICD-10-CM | POA: Diagnosis not present

## 2013-09-05 DIAGNOSIS — G309 Alzheimer's disease, unspecified: Secondary | ICD-10-CM | POA: Diagnosis not present

## 2013-09-05 DIAGNOSIS — I10 Essential (primary) hypertension: Secondary | ICD-10-CM | POA: Diagnosis not present

## 2013-09-05 DIAGNOSIS — F329 Major depressive disorder, single episode, unspecified: Secondary | ICD-10-CM | POA: Diagnosis not present

## 2013-09-05 DIAGNOSIS — Z9889 Other specified postprocedural states: Secondary | ICD-10-CM | POA: Diagnosis not present

## 2013-09-05 DIAGNOSIS — I781 Nevus, non-neoplastic: Secondary | ICD-10-CM | POA: Diagnosis not present

## 2013-09-05 DIAGNOSIS — F3289 Other specified depressive episodes: Secondary | ICD-10-CM | POA: Insufficient documentation

## 2013-09-05 DIAGNOSIS — D649 Anemia, unspecified: Secondary | ICD-10-CM | POA: Insufficient documentation

## 2013-09-05 DIAGNOSIS — L821 Other seborrheic keratosis: Secondary | ICD-10-CM | POA: Diagnosis not present

## 2013-09-05 DIAGNOSIS — D239 Other benign neoplasm of skin, unspecified: Secondary | ICD-10-CM | POA: Diagnosis not present

## 2013-09-05 DIAGNOSIS — D72829 Elevated white blood cell count, unspecified: Secondary | ICD-10-CM

## 2013-09-05 DIAGNOSIS — Z79899 Other long term (current) drug therapy: Secondary | ICD-10-CM | POA: Diagnosis not present

## 2013-09-05 DIAGNOSIS — R079 Chest pain, unspecified: Secondary | ICD-10-CM | POA: Diagnosis not present

## 2013-09-05 DIAGNOSIS — L570 Actinic keratosis: Secondary | ICD-10-CM | POA: Diagnosis not present

## 2013-09-05 DIAGNOSIS — R0602 Shortness of breath: Secondary | ICD-10-CM | POA: Diagnosis not present

## 2013-09-05 LAB — COMPREHENSIVE METABOLIC PANEL
ALK PHOS: 98 U/L (ref 39–117)
ALT: 12 U/L (ref 0–53)
AST: 14 U/L (ref 0–37)
Albumin: 3.7 g/dL (ref 3.5–5.2)
Anion gap: 15 (ref 5–15)
BUN: 15 mg/dL (ref 6–23)
CO2: 23 mEq/L (ref 19–32)
Calcium: 8.7 mg/dL (ref 8.4–10.5)
Chloride: 99 mEq/L (ref 96–112)
Creatinine, Ser: 1.22 mg/dL (ref 0.50–1.35)
GFR, EST AFRICAN AMERICAN: 61 mL/min — AB (ref >90)
GFR, EST NON AFRICAN AMERICAN: 52 mL/min — AB (ref >90)
GLUCOSE: 288 mg/dL — AB (ref 70–99)
POTASSIUM: 4.2 meq/L (ref 3.7–5.3)
Sodium: 137 mEq/L (ref 137–147)
Total Bilirubin: 0.6 mg/dL (ref 0.3–1.2)
Total Protein: 6.8 g/dL (ref 6.0–8.3)

## 2013-09-05 LAB — CBC
HCT: 40.4 % (ref 39.0–52.0)
HEMOGLOBIN: 13.7 g/dL (ref 13.0–17.0)
MCH: 31.3 pg (ref 26.0–34.0)
MCHC: 33.9 g/dL (ref 30.0–36.0)
MCV: 92.2 fL (ref 78.0–100.0)
Platelets: 233 10*3/uL (ref 150–400)
RBC: 4.38 MIL/uL (ref 4.22–5.81)
RDW: 13.1 % (ref 11.5–15.5)
WBC: 13.8 10*3/uL — AB (ref 4.0–10.5)

## 2013-09-05 LAB — TROPONIN I: Troponin I: <0.3 ng/mL (ref <0.30)

## 2013-09-05 LAB — PROTIME-INR
INR: 0.97 (ref 0.00–1.49)
PROTHROMBIN TIME: 12.9 s (ref 11.6–15.2)

## 2013-09-05 LAB — MAGNESIUM: Magnesium: 1.7 mg/dL (ref 1.5–2.5)

## 2013-09-05 LAB — PRO B NATRIURETIC PEPTIDE: Pro B Natriuretic peptide (BNP): 587.5 pg/mL — ABNORMAL HIGH (ref 0–450)

## 2013-09-05 MED ORDER — ASPIRIN 81 MG PO CHEW
324.0000 mg | CHEWABLE_TABLET | Freq: Once | ORAL | Status: AC
  Administered 2013-09-05: 324 mg via ORAL
  Filled 2013-09-05: qty 4

## 2013-09-05 NOTE — ED Notes (Addendum)
Patient has been experiencing SOB all day today with exertion. Patient also began having left sided non-radiating CP approximately 20-30 minutes ago. Patient does have a pacemaker for episodic V-Tach. Patient has had 324 ASA PTA. Rhythm is normal sinus. CBG of 259. 95% on 2L. Family states patient has been more confused than normal, but patient has history of alzheimers. Denies CP at this time. Denies n/v or diaphoresis.

## 2013-09-05 NOTE — Discharge Instructions (Signed)
As discussed, although you have elected to return home tonight, it is important to monitor his condition carefully, and do not hesitate to return to emergency department if he develops new, or concerning changes in his condition.  As discussed, it is also important that you speak with his primary care physician tomorrow to insure appropriate ongoing evaluation of his condition.

## 2013-09-05 NOTE — ED Notes (Signed)
Reviewed discharge vitals and wbc count of 13 with Dr. Vanita Panda. Patient is to follow up tomorrow, no new orders and is cleared to be discharged after discussion with family per MD.

## 2013-09-05 NOTE — ED Notes (Signed)
Dr. Vanita Panda at the bedside, patient is being prepared for discharge.

## 2013-09-05 NOTE — ED Notes (Signed)
Pt has tremors of bilateral upper extremities. Pt oriented to person and place. Pt c/o no pain at this time.

## 2013-09-05 NOTE — ED Provider Notes (Signed)
CSN: [id number]     Arrival date & time 09/05/13  2100 History   First MD Initiated Contact with Patient 09/05/13 2108     Chief Complaint  Patient presents with  . Shortness of Breath  . Chest Pain     (Consider location/radiation/quality/duration/timing/severity/associated sxs/prior Treatment) HPI Patient presents with possible chest pain, though on my exam the patient only complains of bilateral anterior thigh pain which seems chronic for him. Patient has dementia, level V caveat.  Past Medical History  Diagnosis Date  . Diabetes   . Hypertension   . Depression   . Dementia in Alzheimer's disease   . Hyperlipidemia   . Episodic VTach, pacer     Followed by Dr. Avon Gully  . Urinary incontinence   . Anemia 06/2012  . Elevated PSA   . Microalbuminuria   . BPH (benign prostatic hyperplasia)     Followed by Dr. Matilde Sprang  . Hiatal hernia 02/26/2010  . Gastritis 02/26/2010    moderate  . Diverticulosis 02/26/2010    Sigmoid colon  . Duodenitis 02/26/2010  . Brady-tachy syndrome     04/17/2009 MDT pacer Enrhythmatr fib  . Atrial fibrillation   . CAD (coronary artery disease)    Past Surgical History  Procedure Laterality Date  . Appendectomy  1948  . Permanent pacemaker insertion  04/17/2009    MDT Enrhythm  . Cholecystectomy    . Cataracts    . Cardiac catheterization  07/07/2007    mild to mod. ca+ w/narrowing of 20% ostium of second diagonal,diffuse 80% stenosis small caliber infer. branch,20% mid LAD,60-70% narrowing mid atrioventricular groove CX,mild 20-30% RCA  . Carotid doppler  10/01/2006    0-49% right bulb,prox,ECA & prox ICA,0-49% left subclavian bulb & prox ICA,right & left ICA abn. resistance  . Nm myoview ltd  10/01/2010    No ischemia   Family History  Problem Relation Age of Onset  . Heart disease Father     Deceased  . Alzheimer's disease Mother     Deceased   History  Substance Use Topics  . Smoking status: Former Research scientist (life sciences)  . Smokeless tobacco:  Former Systems developer    Quit date: 02/09/1974  . Alcohol Use: No    Review of Systems  Unable to perform ROS: Dementia      Allergies  Review of patient's allergies indicates no known allergies.  Home Medications   Prior to Admission medications   Medication Sig Start Date End Date Taking? Authorizing Provider  amLODipine (NORVASC) 5 MG tablet Take 5 mg by mouth daily.    Historical Provider, MD  aspirin 81 MG tablet Take 81 mg by mouth daily.    Historical Provider, MD  benzonatate (TESSALON) 100 MG capsule Take 2 capsules (200 mg total) by mouth 2 (two) times daily as needed for cough. 08/19/12   Thao P Le, DO  clonazePAM (KLONOPIN) 0.5 MG tablet Take 1 tablet (0.5 mg total) by mouth at bedtime as needed for anxiety. 11/24/12   Asencion Partridge Dohmeier, MD  donepezil (ARICEPT) 10 MG tablet Take 2 tablets (20 mg total) by mouth daily. 11/24/12   Asencion Partridge Dohmeier, MD  ferrous sulfate 325 (65 FE) MG tablet TAKE 1 TABLET BY MOUTH 1 TIME A DAY 07/08/10   Lafayette Dragon, MD  glipiZIDE (GLUCOTROL XL) 5 MG 24 hr tablet Take 5 mg by mouth daily.    Historical Provider, MD  HYDROcodone-acetaminophen (NORCO/VICODIN) 5-325 MG per tablet Take 1 tablet by mouth every 6 (six) hours as needed for  pain. Take 1 tab twice daily.    Historical Provider, MD  ipratropium (ATROVENT) 0.03 % nasal spray PLACE 2 SPRAYS INTO THE NOSE EVERY 12 (TWELVE) HOURS. 09/18/12   Rise Mu, PA-C  isosorbide mononitrate (IMDUR) 60 MG 24 hr tablet Take 60 mg by mouth daily. Patient takes 1/2 tab daily    Historical Provider, MD  metoprolol succinate (TOPROL-XL) 50 MG 24 hr tablet TAKE 1 TABLET DAILY 04/04/13   Mihai Croitoru, MD  pantoprazole (PROTONIX) 40 MG tablet Take 40 mg by mouth daily.    Historical Provider, MD  pramipexole (MIRAPEX) 0.125 MG tablet Take 2 tablets (0.25 mg total) by mouth every evening. Two hours before bedtime 11/24/12   Larey Seat, MD  rosuvastatin (CRESTOR) 20 MG tablet Take 20 mg by mouth daily.    Historical  Provider, MD  sertraline (ZOLOFT) 25 MG tablet Take 25 mg by mouth daily.    Historical Provider, MD  traMADol (ULTRAM) 50 MG tablet Take 50 mg by mouth every 8 (eight) hours as needed.    Historical Provider, MD   BP 173/54  Pulse 70  Temp(Src) 99.6 F (37.6 C) (Oral)  Resp 26  SpO2 100% Physical Exam  Nursing note and vitals reviewed. Constitutional: He is oriented to person, place, and time. He appears well-developed. No distress.  HENT:  Head: Normocephalic and atraumatic.  Eyes: Conjunctivae and EOM are normal.  Cardiovascular: Normal rate and regular rhythm.   Pulmonary/Chest: Effort normal. No stridor. No respiratory distress.  Abdominal: He exhibits no distension.  Musculoskeletal: He exhibits no edema.  Neurological: He is alert and oriented to person, place, and time.  Skin: Skin is warm and dry.  Psychiatric: His speech is delayed. He is slowed. Cognition and memory are impaired.    ED Course  Procedures (including critical care time) Labs Review Labs Reviewed  CBC - Abnormal; Notable for the following:    WBC 13.8 (*)    All other components within normal limits  COMPREHENSIVE METABOLIC PANEL - Abnormal; Notable for the following:    Glucose, Bld 288 (*)    GFR calc non Af Amer 52 (*)    GFR calc Af Amer 61 (*)    All other components within normal limits  PRO B NATRIURETIC PEPTIDE - Abnormal; Notable for the following:    Pro B Natriuretic peptide (BNP) 587.5 (*)    All other components within normal limits  MAGNESIUM  PROTIME-INR  TROPONIN I    Imaging Review Dg Chest 2 View  09/05/2013   CLINICAL DATA:  Chest pain.  EXAM: CHEST  2 VIEW  COMPARISON:  PA and lateral chest 08/19/2012.  FINDINGS: Lung volumes are lower than on the comparison study with crowding of the bronchovascular structures. There is no consolidative process, pneumothorax or effusion. Heart size is normal. Pacing device noted.  IMPRESSION: No acute disease.   Electronically Signed   By:  Inge Rise M.D.   On: 09/05/2013 21:52     EKG Interpretation   Date/Time:  Monday September 05 2013 21:14:02 EDT Ventricular Rate:  69 PR Interval:  160 QRS Duration: 99 QT Interval:  411 QTC Calculation: 440 R Axis:   -32 Text Interpretation:  Sinus rhythm Left axis deviation Abnormal R-wave  progression, late transition Sinus rhythm Artifact Abnormal ekg Confirmed  by Carmin Muskrat  MD ([id number]) on 09/05/2013 9:20:57 PM      Update: Patient and in no distress, sitting upright.  Patient has mild tachypnea, but is mostly  breathing in a shallow manner.  All results reviewed with the patient, his wife, a family friend, as well as a home Economist. We had a lengthy conversation about his history, the difficulty in obtaining accurate history of present illness due to the patient's illness, and the largely reassuring labs, including mild leukocytosis. I advocated admission for monitoring overnight, but given the patient's dementia, family requested discharge, with next day followup. MDM   Final diagnoses:  Leukocytosis  Chest pain, unspecified chest pain type   This patient with a history of Alzheimer's dementia presents after a possible episode of chest pain.  On my exam the patient is awake and alert, comfortable, denying any substantial complaints beyond mild bilateral anterior thigh pain.  The patient remained hemodynamically stable, with mild tachypnea in the emergency department, and his labs were largely reassuring, though there is mild leukocytosis.  Given the difficulty in obtaining history, I advocated for admission for further evaluation and management, though given the patient's multiple medical problems, his non-resuscitative status, return to his house, to be monitored by home health, as well as his wife with next day primary care followup is not an unreasonable option  Carmin Muskrat, MD 09/05/13 2324

## 2013-09-12 DIAGNOSIS — K047 Periapical abscess without sinus: Secondary | ICD-10-CM | POA: Diagnosis not present

## 2013-09-12 DIAGNOSIS — I10 Essential (primary) hypertension: Secondary | ICD-10-CM | POA: Diagnosis not present

## 2013-09-12 DIAGNOSIS — E1159 Type 2 diabetes mellitus with other circulatory complications: Secondary | ICD-10-CM | POA: Diagnosis not present

## 2013-09-12 DIAGNOSIS — J069 Acute upper respiratory infection, unspecified: Secondary | ICD-10-CM | POA: Diagnosis not present

## 2013-09-12 DIAGNOSIS — G309 Alzheimer's disease, unspecified: Secondary | ICD-10-CM | POA: Diagnosis not present

## 2013-09-12 DIAGNOSIS — Z6830 Body mass index (BMI) 30.0-30.9, adult: Secondary | ICD-10-CM | POA: Diagnosis not present

## 2013-09-12 DIAGNOSIS — F0280 Dementia in other diseases classified elsewhere without behavioral disturbance: Secondary | ICD-10-CM | POA: Diagnosis not present

## 2013-09-12 DIAGNOSIS — I4891 Unspecified atrial fibrillation: Secondary | ICD-10-CM | POA: Diagnosis not present

## 2013-09-15 ENCOUNTER — Ambulatory Visit (INDEPENDENT_AMBULATORY_CARE_PROVIDER_SITE_OTHER): Payer: Medicare Other | Admitting: *Deleted

## 2013-09-15 DIAGNOSIS — I480 Paroxysmal atrial fibrillation: Secondary | ICD-10-CM

## 2013-09-15 DIAGNOSIS — Z950 Presence of cardiac pacemaker: Secondary | ICD-10-CM

## 2013-09-15 DIAGNOSIS — I4891 Unspecified atrial fibrillation: Secondary | ICD-10-CM

## 2013-09-15 LAB — MDC_IDC_ENUM_SESS_TYPE_INCLINIC
Battery Voltage: 2.99 V
Brady Statistic RA Percent Paced: 66.95 %
Brady Statistic RV Percent Paced: 3.23 %
Lead Channel Impedance Value: 576 Ohm
Lead Channel Pacing Threshold Amplitude: 1 V
Lead Channel Pacing Threshold Pulse Width: 0.4 ms
Lead Channel Pacing Threshold Pulse Width: 0.4 ms
Lead Channel Setting Pacing Amplitude: 2 V
Lead Channel Setting Pacing Pulse Width: 0.4 ms
Lead Channel Setting Sensing Sensitivity: 0.9 mV
MDC IDC MSMT LEADCHNL RA PACING THRESHOLD AMPLITUDE: 1 V
MDC IDC MSMT LEADCHNL RA SENSING INTR AMPL: 3.2 mV
MDC IDC MSMT LEADCHNL RV IMPEDANCE VALUE: 528 Ohm
MDC IDC MSMT LEADCHNL RV SENSING INTR AMPL: 7.5 mV
MDC IDC SESS DTM: 20150806130802
MDC IDC SET LEADCHNL RV PACING AMPLITUDE: 2.5 V
MDC IDC STAT BRADY AP VP PERCENT: 0.48 %
MDC IDC STAT BRADY AP VS PERCENT: 66.47 %
MDC IDC STAT BRADY AS VP PERCENT: 2.75 %
MDC IDC STAT BRADY AS VS PERCENT: 30.3 %
Zone Setting Detection Interval: 350 ms
Zone Setting Detection Interval: 400 ms

## 2013-09-15 NOTE — Progress Notes (Signed)
Pacemaker check in clinic. Normal device function. Thresholds, sensing, impedances consistent with previous measurements. Device programmed to maximize longevity. 8.1% mode switch + ASA (prior bleeding complication). 6 NSVT---10 beats. Device programmed at appropriate safety margins. Histogram distribution appropriate for patient activity level. Device programmed to optimize intrinsic conduction. Battery @2 .99V. ROV w/ Dr. Elwyn Reach 12/29/13.

## 2013-09-20 ENCOUNTER — Encounter: Payer: Self-pay | Admitting: Internal Medicine

## 2013-10-10 ENCOUNTER — Encounter: Payer: Self-pay | Admitting: Cardiovascular Disease

## 2013-10-26 DIAGNOSIS — I4891 Unspecified atrial fibrillation: Secondary | ICD-10-CM | POA: Diagnosis not present

## 2013-10-26 DIAGNOSIS — K219 Gastro-esophageal reflux disease without esophagitis: Secondary | ICD-10-CM | POA: Diagnosis not present

## 2013-10-26 DIAGNOSIS — I10 Essential (primary) hypertension: Secondary | ICD-10-CM | POA: Diagnosis not present

## 2013-10-26 DIAGNOSIS — E785 Hyperlipidemia, unspecified: Secondary | ICD-10-CM | POA: Diagnosis not present

## 2013-10-26 DIAGNOSIS — Z23 Encounter for immunization: Secondary | ICD-10-CM | POA: Diagnosis not present

## 2013-10-26 DIAGNOSIS — G309 Alzheimer's disease, unspecified: Secondary | ICD-10-CM | POA: Diagnosis not present

## 2013-10-26 DIAGNOSIS — E1159 Type 2 diabetes mellitus with other circulatory complications: Secondary | ICD-10-CM | POA: Diagnosis not present

## 2013-10-26 DIAGNOSIS — Z950 Presence of cardiac pacemaker: Secondary | ICD-10-CM | POA: Diagnosis not present

## 2013-10-26 DIAGNOSIS — I2510 Atherosclerotic heart disease of native coronary artery without angina pectoris: Secondary | ICD-10-CM | POA: Diagnosis not present

## 2013-10-26 DIAGNOSIS — F0280 Dementia in other diseases classified elsewhere without behavioral disturbance: Secondary | ICD-10-CM | POA: Diagnosis not present

## 2013-11-25 ENCOUNTER — Encounter: Payer: Self-pay | Admitting: Adult Health

## 2013-11-25 ENCOUNTER — Encounter (INDEPENDENT_AMBULATORY_CARE_PROVIDER_SITE_OTHER): Payer: Self-pay

## 2013-11-25 ENCOUNTER — Ambulatory Visit (INDEPENDENT_AMBULATORY_CARE_PROVIDER_SITE_OTHER): Payer: Medicare Other | Admitting: Adult Health

## 2013-11-25 VITALS — BP 129/64 | HR 67 | Ht 71.0 in | Wt 221.0 lb

## 2013-11-25 DIAGNOSIS — G2581 Restless legs syndrome: Secondary | ICD-10-CM

## 2013-11-25 DIAGNOSIS — R413 Other amnesia: Secondary | ICD-10-CM

## 2013-11-25 DIAGNOSIS — I2510 Atherosclerotic heart disease of native coronary artery without angina pectoris: Secondary | ICD-10-CM | POA: Diagnosis not present

## 2013-11-25 NOTE — Patient Instructions (Signed)

## 2013-11-25 NOTE — Progress Notes (Signed)
PATIENT: Jon Davis DOB: Oct 04, 1926  REASON FOR VISIT: follow up HISTORY FROM: patient  HISTORY OF PRESENT ILLNESS: Mr. Jon Davis is an 80 year old male with a history of memory loss and RLS. He returns today for follow-up. He is currently taking Aricept and tolerating it well. He reports that his memory has gotten worse. He states that he notices the most with his short term memory. He forgets people names and has to ask questions over and over. He is able to complete all ADLs without assistance. Denies having to give up anything due to his memory. He no longer operates a motor vehicle but that was not do to his memory. They gave their vehicle today her grandson. They have nurses they come in from [facility] daily.  He continues to take Mirapex and xanax for RLS and reports that this is working well. He states that he is sleeping well now. His wife reports that he sleeps some during the day as well. Denies any new medical issues.   HISTORY 05/26/13 (CM): 80 year old male returns for followup. He was initially evaluated for memory loss by Dr. Brett Fairy 11/24/2012. He continues to have short-term memory problems. He also has restless leg syndrome is under better control with increasing his Mirapex. 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 He is ambulating with a cane, he denies any falls. He is alone at today's visit. He denies any side effects to his Aricept or other medications. He has not had compulsive behaviors on Mirapex, he returns for reevaluation  REVIEW OF SYSTEMS: Full 14 system review of systems performed and notable only for:  Constitutional: Appetite change, fatigue Eyes: N/A Ear/Nose/Throat: Hearing loss, runny nose, trouble swallowing Skin: N/A  Cardiovascular: N/A  Respiratory: N/A  Gastrointestinal: Black stools, incontinence of bowels Genitourinary: N/A Hematology/Lymphatic: Anemia Endocrine:  N/A Musculoskeletal:N/A  Allergy/Immunology: N/A  Neurological: Memory loss Psychiatric: Agitation, confusion, depression, nervous/anxious Sleep: Restless leg, snoring, sleep talking   ALLERGIES: No Known Allergies  HOME MEDICATIONS: Outpatient Prescriptions Prior to Visit  Medication Sig Dispense Refill  . ALPRAZolam (XANAX) 0.25 MG tablet Take 0.25 mg by mouth 2 (two) times daily as needed for anxiety.      [name] amLODipine (NORVASC) 5 MG tablet Take 2.5 mg by mouth daily.       [name] aspirin 81 MG tablet Take 81 mg by mouth daily.      [name] atorvastatin (LIPITOR) 20 MG tablet Take 20 mg by mouth daily.      . benzonatate (TESSALON) 100 MG capsule Take 100 mg by mouth 2 (two) times daily. Take 2 capsules by mouth twice daily as needed for cough      . donepezil (ARICEPT) 23 MG TABS tablet Take 23 mg by mouth daily.      . ferrous sulfate 325 (65 FE) MG tablet Take 325 mg by mouth 2 (two) times daily.      [name] glipiZIDE (GLUCOTROL XL) 5 MG 24 hr tablet Take 5 mg by mouth daily.      [name] HYDROcodone-acetaminophen (NORCO/VICODIN) 5-325 MG per tablet Take 1 tablet by mouth 2 (two) times daily.      . isosorbide mononitrate (IMDUR) 30 MG 24 hr tablet Take 30 mg by mouth daily.      . Memantine HCl ER (NAMENDA XR) 28 MG CP24 Take 1 capsule by mouth daily.      . metoprolol succinate (TOPROL-XL) 50 MG 24 hr  tablet Take 50 mg by mouth daily. Take with or immediately following a meal.      . mupirocin cream (BACTROBAN) 2 % Apply 1 application topically 2 (two) times daily. Apply twice daily      . pantoprazole (PROTONIX) 40 MG tablet Take 40 mg by mouth daily.      . pramipexole (MIRAPEX) 0.125 MG tablet Take 2 tablets (0.25 mg total) by mouth every evening. Two hours before bedtime  90 tablet  3  . sertraline (ZOLOFT) 50 MG tablet Take 50 mg by mouth daily.       No facility-administered medications prior to visit.    PAST MEDICAL HISTORY: Past Medical History  Diagnosis Date  . Diabetes   .  Hypertension   . Depression   . Dementia in Alzheimer's disease   . Hyperlipidemia   . Episodic VTach, pacer     Followed by Dr. Avon Gully  . Urinary incontinence   . Anemia 06/2012  . Elevated PSA   . Microalbuminuria   . BPH (benign prostatic hyperplasia)     Followed by Dr. Matilde Sprang  . Hiatal hernia 02/26/2010  . Gastritis 02/26/2010    moderate  . Diverticulosis 02/26/2010    Sigmoid colon  . Duodenitis 02/26/2010  . Brady-tachy syndrome     04/17/2009 MDT pacer Enrhythmatr fib  . Atrial fibrillation   . CAD (coronary artery disease)     PAST SURGICAL HISTORY: Past Surgical History  Procedure Laterality Date  . Appendectomy  1948  . Permanent pacemaker insertion  04/17/2009    MDT Enrhythm  . Cholecystectomy    . Cataracts    . Cardiac catheterization  07/07/2007    mild to mod. ca+ w/narrowing of 20% ostium of second diagonal,diffuse 80% stenosis small caliber infer. branch,20% mid LAD,60-70% narrowing mid atrioventricular groove CX,mild 20-30% RCA  . Carotid doppler  10/01/2006    0-49% right bulb,prox,ECA & prox ICA,0-49% left subclavian bulb & prox ICA,right & left ICA abn. resistance  . Nm myoview ltd  10/01/2010    No ischemia    FAMILY HISTORY: Family History  Problem Relation Age of Onset  . Heart disease Father     Deceased  . Alzheimer's disease Mother     Deceased    SOCIAL HISTORY: History   Social History  . Marital Status: Married    Spouse Name: [name]     Number of Children: 2  . Years of Education: 12   Occupational History  .    . retired    Social History Main Topics  . Smoking status: Former Research scientist (life sciences)  . Smokeless tobacco: Former Systems developer    Quit date: 02/09/1974  . Alcohol Use: No  . Drug Use: No  . Sexual Activity: No   Other Topics Concern  . Not on file   Social History Narrative   Patient lives at home with wife [name].    Patient has 2 children.    Patient has a high school education.    Patient retired.    Patient is  right handed.       PHYSICAL EXAM  Filed Vitals:   11/25/13 1010  BP: 129/64  Pulse: 67  Height: 5\' 11"  (1.803 m)  Weight: 221 lb (100.245 kg)   Body mass index is 30.84 kg/(m^2).  Generalized: Well developed, in no acute distress   Neurological examination  Mentation: Alert oriented to time, place, history taking. Follows all commands speech and language fluent. MOCA 21/30 Cranial nerve II-XII: Pupils  were equal round reactive to light. Extraocular movements were full, visual field were full on confrontational test. Facial sensation and strength were normal.  Uvula tongue midline. Head turning and shoulder shrug  were normal and symmetric. Motor: The motor testing reveals 5 over 5 strength of all 4 extremities. Good symmetric motor tone is noted throughout.  Sensory: Sensory testing is intact to soft touch on all 4 extremities. No evidence of extinction is noted.  Coordination: Cerebellar testing reveals good finger-nose-finger and heel-to-shin bilaterally.  Gait and station:  Patient unable to stand from a sitting position without assistance. Gait is slightly wide based. Tandem gait not attempted. Romberg is negative. No drift is seen.  Reflexes: Deep tendon reflexes are symmetric and normal bilaterally.    DIAGNOSTIC DATA (LABS, IMAGING, TESTING) - I reviewed patient records, labs, notes, testing and imaging myself where available.  Lab Results  Component Value Date   WBC 13.8* 09/05/2013   HGB 13.7 09/05/2013   HCT 40.4 09/05/2013   MCV 92.2 09/05/2013   PLT 233 09/05/2013      Component Value Date/Time   NA 137 09/05/2013 2130   K 4.2 09/05/2013 2130   CL 99 09/05/2013 2130   CO2 23 09/05/2013 2130   GLUCOSE 288* 09/05/2013 2130   BUN 15 09/05/2013 2130   CREATININE 1.22 09/05/2013 2130   CREATININE 1.21 08/19/2012 1151   CALCIUM 8.7 09/05/2013 2130   PROT 6.8 09/05/2013 2130   ALBUMIN 3.7 09/05/2013 2130   AST 14 09/05/2013 2130   ALT 12 09/05/2013 2130   ALKPHOS 98  09/05/2013 2130   BILITOT 0.6 09/05/2013 2130   GFRNONAA 52* 09/05/2013 2130   GFRAA 61* 09/05/2013 2130   Lab Results  Component Value Date   CHOL  Value: 119        ATP III CLASSIFICATION:  <200     mg/dL   Desirable  200-239  mg/dL   Borderline High  >=240    mg/dL   High 07/06/2007   HDL 38* 07/06/2007   LDLCALC  Value: 63        Total Cholesterol/HDL:CHD Risk Coronary Heart Disease Risk Table                     Men   Women  1/2 Average Risk   3.4   3.3 07/06/2007   TRIG 90 07/06/2007   CHOLHDL 3.1 07/06/2007   Lab Results  Component Value Date   HGBA1C  Value: 6.2 (NOTE)   The ADA recommends the following therapeutic goals for glycemic   control related to Hgb A1C measurement:   Goal of Therapy:   < 7.0% Hgb A1C   Action Suggested:  > 8.0% Hgb A1C   Ref:  Diabetes Care, 22, Suppl. 1, 1999* 07/05/2007   Lab Results  Component Value Date   VITAMINB12 361 01/24/2010       ASSESSMENT AND PLAN 80 y.o. year old male  has a past medical history of Diabetes; Hypertension; Depression; Dementia in Alzheimer's disease; Hyperlipidemia; Episodic VTach, pacer; Urinary incontinence; Anemia (06/2012); Elevated PSA; Microalbuminuria; BPH (benign prostatic hyperplasia); Hiatal hernia (02/26/2010); Gastritis (02/26/2010); Diverticulosis (02/26/2010); Duodenitis (02/26/2010); Brady-tachy syndrome; Atrial fibrillation; and CAD (coronary artery disease). here with;  1. Memory loss 2. Restless leg syndrome  Patient feels that his short term memory may have gotten worse since last visit. His MOCA score today was 21/30 his previous score was 22/30. He will continue on the Aricept and Namenda. Patient states that  since his medication was changed from clonazepam to Xanax his restless legs has been controlled. He continues to take the Mirapex as well. Overall the patient has remained stable since the last visit. If his symptoms worsen or he develops new symptoms he should let us know. Otherwise he will followup in 6  months.  Ward Givens, MSN, NP-C 11/25/2013, 10:12 AM [name] [address] [place], [place] [address] [phone]  Note: This document was prepared with digital dictation and possible smart phrase technology. Any transcriptional errors that result from this process are unintentional.

## 2013-11-25 NOTE — Progress Notes (Signed)
I agree with the assessment and plan as directed by NP .The patient is known to me .   Niyah Mamaril, MD  

## 2013-12-23 ENCOUNTER — Other Ambulatory Visit: Payer: Self-pay

## 2013-12-23 MED ORDER — DONEPEZIL HCL 10 MG PO TABS: 20.0000 mg | ORAL_TABLET | Freq: Every day | ORAL | Status: DC

## 2013-12-28 ENCOUNTER — Encounter: Payer: Self-pay | Admitting: Neurology

## 2013-12-29 ENCOUNTER — Ambulatory Visit (INDEPENDENT_AMBULATORY_CARE_PROVIDER_SITE_OTHER): Payer: Medicare Other | Admitting: Cardiovascular Disease

## 2013-12-29 ENCOUNTER — Encounter: Payer: Self-pay | Admitting: Cardiovascular Disease

## 2013-12-29 VITALS — BP 126/74 | HR 80 | Resp 16 | Ht 70.0 in | Wt 218.8 lb

## 2013-12-29 DIAGNOSIS — Z950 Presence of cardiac pacemaker: Secondary | ICD-10-CM | POA: Diagnosis not present

## 2013-12-29 DIAGNOSIS — I472 Ventricular tachycardia: Secondary | ICD-10-CM

## 2013-12-29 DIAGNOSIS — I2510 Atherosclerotic heart disease of native coronary artery without angina pectoris: Secondary | ICD-10-CM | POA: Diagnosis not present

## 2013-12-29 DIAGNOSIS — I480 Paroxysmal atrial fibrillation: Secondary | ICD-10-CM | POA: Diagnosis not present

## 2013-12-29 DIAGNOSIS — I10 Essential (primary) hypertension: Secondary | ICD-10-CM | POA: Diagnosis not present

## 2013-12-29 DIAGNOSIS — I495 Sick sinus syndrome: Secondary | ICD-10-CM | POA: Diagnosis not present

## 2013-12-29 DIAGNOSIS — I4729 Other ventricular tachycardia: Secondary | ICD-10-CM

## 2013-12-29 DIAGNOSIS — I700 Atherosclerosis of aorta: Secondary | ICD-10-CM | POA: Diagnosis not present

## 2013-12-29 LAB — MDC_IDC_ENUM_SESS_TYPE_INCLINIC
Battery Voltage: 2.99 V
Brady Statistic AP VP Percent: 1.29 %
Brady Statistic AP VS Percent: 89.96 %
Brady Statistic AS VP Percent: 0.42 %
Brady Statistic AS VS Percent: 8.32 %
Brady Statistic RA Percent Paced: 91.26 %
Brady Statistic RV Percent Paced: 1.72 %
Date Time Interrogation Session: 20151119124613
Lead Channel Impedance Value: 472 Ohm
Lead Channel Impedance Value: 592 Ohm
Lead Channel Pacing Threshold Amplitude: 1 V
Lead Channel Pacing Threshold Amplitude: 1 V
Lead Channel Pacing Threshold Pulse Width: 0.4 ms
Lead Channel Pacing Threshold Pulse Width: 0.4 ms
Lead Channel Sensing Intrinsic Amplitude: 11.5981
Lead Channel Sensing Intrinsic Amplitude: 3.1427
Lead Channel Setting Pacing Amplitude: 2 V
Lead Channel Setting Pacing Amplitude: 2.5 V
Lead Channel Setting Pacing Pulse Width: 0.4 ms
Lead Channel Setting Sensing Sensitivity: 0.9 mV
Zone Setting Detection Interval: 350 ms
Zone Setting Detection Interval: 400 ms

## 2013-12-29 NOTE — Progress Notes (Signed)
Patient ID: Jon Davis, male   DOB: [birth year], 80 y.o.   MRN: [id number]     Reason for office visit atrial fibrillation, pacemaker check, CAD  Jon Davis has no cardiovascular complaints today and is generally doing well. Again, he is more concerned about his wife's health. He has not had any recent falls, although this has been a past problem. He denies angina pectoris. He does not have shortness of breath. He has not had syncope, presyncope or palpitations.  Jon Davis has known coronary disease with stenoses in secondary branches and no evidence of ischemia by nuclear stress test in August of 2012. His device has recorded atrial fibrillation in the past, but none in the last 3 years. He has had recurrent problems with iron deficiency anemia and for this reason is currently taking only aspirin for prophylaxis rather than warfarin or another anticoagulant. He has severe aortic atherosclerosis with ulcerated mobile plaque demonstrated in the descending thoracic aorta by previous TEE. He has well-controlled type 2 diabetes mellitus and hyperlipidemia on statin therapy. He received a dual-chamber permanent pacemaker in 2011 for symptomatic sinus bradycardia. Both before and after pacemaker implantation he has had occasional brief episodes of nonsustained ventricular tachycardia and atrial tachycardia, requiring beta blocker therapy.  Interrogation of his pacemaker today shows a single 6-beat episode of nonsustained ventricular tachycardia (longer episodes recorded in the past). There is no atrial fibrillation. There is roughly 92% atrial pacing and <2% ventricular pacing. Pacemaker function is normal.   No Known Allergies  Current Outpatient Prescriptions  Medication Sig Dispense Refill  . ALPRAZolam (XANAX) 0.25 MG tablet Take 0.25 mg by mouth 2 (two) times daily as needed for anxiety.    [name] amLODipine (NORVASC) 5 MG tablet Take 2.5 mg by mouth daily.     [name] aspirin 81 MG tablet Take 81 mg by  mouth daily.    [name] atorvastatin (LIPITOR) 20 MG tablet Take 20 mg by mouth daily.    . benzonatate (TESSALON) 100 MG capsule Take 100 mg by mouth 2 (two) times daily. Take 2 capsules by mouth twice daily as needed for cough    . donepezil (ARICEPT) 10 MG tablet Take 2 tablets (20 mg total) by mouth daily. 180 tablet 1  . ferrous sulfate 325 (65 FE) MG tablet Take 325 mg by mouth 2 (two) times daily.    [name] glipiZIDE (GLUCOTROL XL) 5 MG 24 hr tablet Take 5 mg by mouth daily.    [name] HYDROcodone-acetaminophen (NORCO/VICODIN) 5-325 MG per tablet Take 1 tablet by mouth 2 (two) times daily.    . isosorbide mononitrate (IMDUR) 30 MG 24 hr tablet Take 30 mg by mouth daily.    . Memantine HCl ER (NAMENDA XR) 28 MG CP24 Take 1 capsule by mouth daily.    . metoprolol succinate (TOPROL-XL) 50 MG 24 hr tablet Take 50 mg by mouth daily. Take with or immediately following a meal.    . mupirocin cream (BACTROBAN) 2 % Apply 1 application topically 2 (two) times daily. Apply twice daily    . pantoprazole (PROTONIX) 40 MG tablet Take 40 mg by mouth daily.    . pramipexole (MIRAPEX) 0.125 MG tablet Take 2 tablets (0.25 mg total) by mouth every evening. Two hours before bedtime 90 tablet 3  . sertraline (ZOLOFT) 50 MG tablet Take 50 mg by mouth daily.     No current facility-administered medications for this visit.    Past Medical History  Diagnosis Date  . Diabetes   .  Hypertension   . Depression   . Dementia in Alzheimer's disease   . Hyperlipidemia   . Episodic VTach, pacer     Followed by Dr. Avon Gully  . Urinary incontinence   . Anemia 06/2012  . Elevated PSA   . Microalbuminuria   . BPH (benign prostatic hyperplasia)     Followed by Dr. Matilde Sprang  . Hiatal hernia 02/26/2010  . Gastritis 02/26/2010    moderate  . Diverticulosis 02/26/2010    Sigmoid colon  . Duodenitis 02/26/2010  . Brady-tachy syndrome     04/17/2009 MDT pacer Enrhythmatr fib  . Atrial fibrillation   . CAD (coronary artery  disease)     Past Surgical History  Procedure Laterality Date  . Appendectomy  1948  . Permanent pacemaker insertion  04/17/2009    MDT Enrhythm  . Cholecystectomy    . Cataracts    . Cardiac catheterization  07/07/2007    mild to mod. ca+ w/narrowing of 20% ostium of second diagonal,diffuse 80% stenosis small caliber infer. branch,20% mid LAD,60-70% narrowing mid atrioventricular groove CX,mild 20-30% RCA  . Carotid doppler  10/01/2006    0-49% right bulb,prox,ECA & prox ICA,0-49% left subclavian bulb & prox ICA,right & left ICA abn. resistance  . Nm myoview ltd  10/01/2010    No ischemia    Family History  Problem Relation Age of Onset  . Heart disease Father     Deceased  . Alzheimer's disease Mother     Deceased    History   Social History  . Marital Status: Married    Spouse Name: [name]     Number of Children: 2  . Years of Education: 12   Occupational History  .    . retired    Social History Main Topics  . Smoking status: Former Research scientist (life sciences)  . Smokeless tobacco: Former Systems developer    Quit date: 02/09/1974  . Alcohol Use: No  . Drug Use: No  . Sexual Activity: No   Other Topics Concern  . Not on file   Social History Narrative   Patient lives at home with wife Jon Davis.    Patient has 2 children.    Patient has a high school education.    Patient retired.    Patient is right handed.     Review of systems: The patient specifically denies any chest pain at rest or with exertion, dyspnea at rest or with exertion, orthopnea, paroxysmal nocturnal dyspnea, syncope, palpitations, focal neurological deficits, intermittent claudication, lower extremity edema, unexplained weight gain, cough, hemoptysis or wheezing.  The patient also denies abdominal pain, nausea, vomiting, dysphagia, diarrhea, constipation, polyuria, polydipsia, dysuria, hematuria, frequency, urgency, abnormal bleeding or bruising, fever, chills, unexpected weight changes, mood swings, change in skin or hair  texture, change in voice quality, auditory or visual problems, allergic reactions or rashes, new musculoskeletal complaints other than usual "aches and pains".    PHYSICAL EXAM BP 126/74 mmHg  Pulse 80  Resp 16  Ht 5\' 10"  (1.778 m)  Wt 218 lb 12.8 oz (99.247 kg)  BMI 31.39 kg/m2 General: Alert, oriented x3, no distress Head: no evidence of trauma, PERRL, EOMI, no exophtalmos or lid lag, no myxedema, no xanthelasma; normal ears, nose and oropharynx Neck: normal jugular venous pulsations and no hepatojugular reflux; brisk carotid pulses without delay and no carotid bruits Chest: clear to auscultation, no signs of consolidation by percussion or palpation, normal fremitus, symmetrical and full respiratory excursions Cardiovascular: normal position and quality of the apical impulse, regular  rhythm, normal first and second heart sounds, no murmurs, rubs or gallops Abdomen: no tenderness or distention, no masses by palpation, no abnormal pulsatility or arterial bruits, normal bowel sounds, no hepatosplenomegaly Extremities: no clubbing, cyanosis or edema; 2+ radial, ulnar and brachial pulses bilaterally; 2+ right femoral, posterior tibial and dorsalis pedis pulses; 2+ left femoral, posterior tibial and dorsalis pedis pulses; no subclavian or femoral bruits Neurological: grossly nonfocal  EKG: A paced V sensed, LAFB  Lipid Panel     Component Value Date/Time   CHOL  07/06/2007 0140    119        ATP III CLASSIFICATION:  <200     mg/dL   Desirable  200-239  mg/dL   Borderline High  >=240    mg/dL   High   TRIG 90 07/06/2007 0140   HDL 38* 07/06/2007 0140   CHOLHDL 3.1 07/06/2007 0140   VLDL 18 07/06/2007 0140   LDLCALC  07/06/2007 0140    63        Total Cholesterol/HDL:CHD Risk Coronary Heart Disease Risk Table                     Men   Women  1/2 Average Risk   3.4   3.3    BMET    Component Value Date/Time   NA 137 09/05/2013 2130   K 4.2 09/05/2013 2130   CL 99 09/05/2013  2130   CO2 23 09/05/2013 2130   GLUCOSE 288* 09/05/2013 2130   BUN 15 09/05/2013 2130   CREATININE 1.22 09/05/2013 2130   CREATININE 1.21 08/19/2012 1151   CALCIUM 8.7 09/05/2013 2130   GFRNONAA 52* 09/05/2013 2130   GFRAA 61* 09/05/2013 2130     ASSESSMENT AND PLAN Aortic atherosclerosis Severe,ulcerated plaque, Continue statin.  Atrial fibrillation Over 3 years since last attack event. Continue aspirin. Previous treatment with warfarin was associated with intractable iron deficiency anemia.  CAD (coronary artery disease) Asymptomatic. No ischemia by the most recent nuclear perfusion study. Cardiac catheterization should be performed via radial approach to minimize the risk of cholesterol embolization from his aortic atherosclerosis. Target LDL-C<100, preferably<70.  Nonsustained ventricular tachycardia Asymptomatic lower prevalence than in the past. Continue beta blockers.  Pacemaker Comprehensive check shows normal device function. No reprogramming changes today. Medtronic Enrhythm model [id number] DR may have issues with rapid battery depletion towards the end of its life span.  HTN Great control  Orders Placed This Encounter  Procedures  . Implantable device check   No orders of the defined types were placed in this encounter.    Holli Humbles, MD, [place] [phone] office [phone] pager

## 2013-12-29 NOTE — Patient Instructions (Addendum)
Remote monitoring is used to monitor your PACEMAKER from home. This monitoring reduces the number of office visits required to check your device to one time per year. It allows [place] to keep an eye on the functioning of your device to ensure it is working properly. You are scheduled for a device check from home on 04-03-2014. You may send your transmission at any time that day. If you have a wireless device, the transmission will be sent automatically. After your physician reviews your transmission, you will receive a postcard with your next transmission date.  Dr. Sallyanne Kuster recommends that you schedule a follow-up appointment in: 6 months with device check.  Low-Sodium Eating Plan Sodium raises blood pressure and causes water to be held in the body. Getting less sodium from food will help lower your blood pressure, reduce any swelling, and protect your heart, liver, and kidneys. We get sodium by adding salt (sodium chloride) to food. Most of our sodium comes from canned, boxed, and frozen foods. Restaurant foods, fast foods, and pizza are also very high in sodium. Even if you take medicine to lower your blood pressure or to reduce fluid in your body, getting less sodium from your food is important. WHAT IS MY PLAN? Most people should limit their sodium intake to 2,300 mg a day. Your health care provider recommends that you limit your sodium intake to __2000________ a day.  WHAT DO I NEED TO KNOW ABOUT THIS EATING PLAN? For the low-sodium eating plan, you will follow these general guidelines:  Choose foods with a % Daily Value for sodium of less than 5% (as listed on the food label).   Use salt-free seasonings or herbs instead of table salt or sea salt.   Check with your health care provider or pharmacist before using salt substitutes.   Eat fresh foods.  Eat more vegetables and fruits.  Limit canned vegetables. If you do use them, rinse them well to decrease the sodium.   Limit cheese to  1 oz (28 g) per day.   Eat lower-sodium products, often labeled as "lower sodium" or "no salt added."  Avoid foods that contain monosodium glutamate (MSG). MSG is sometimes added to [place] food and some canned foods.  Check food labels (Nutrition Facts labels) on foods to learn how much sodium is in one serving.  Eat more home-cooked food and less restaurant, buffet, and fast food.  When eating at a restaurant, ask that your food be prepared with less salt or none, if possible.  HOW DO I READ FOOD LABELS FOR SODIUM INFORMATION? The Nutrition Facts label lists the amount of sodium in one serving of the food. If you eat more than one serving, you must multiply the listed amount of sodium by the number of servings. Food labels may also identify foods as:  Sodium free--Less than 5 mg in a serving.  Very low sodium--35 mg or less in a serving.  Low sodium--140 mg or less in a serving.  Light in sodium--50% less sodium in a serving. For example, if a food that usually has 300 mg of sodium is changed to become light in sodium, it will have 150 mg of sodium.  Reduced sodium--25% less sodium in a serving. For example, if a food that usually has 400 mg of sodium is changed to reduced sodium, it will have 300 mg of sodium. WHAT FOODS CAN I EAT? Grains Low-sodium cereals, including oats, puffed wheat and rice, and shredded wheat cereals. Low-sodium crackers. Unsalted  rice and pasta. Lower-sodium bread.  Vegetables Frozen or fresh vegetables. Low-sodium or reduced-sodium canned vegetables. Low-sodium or reduced-sodium tomato sauce and paste. Low-sodium or reduced-sodium tomato and vegetable juices.  Fruits Fresh, frozen, and canned fruit. Fruit juice.  Meat and Other Protein Products Low-sodium canned tuna and salmon. Fresh or frozen meat, poultry, seafood, and fish. Lamb. Unsalted nuts. Dried beans, peas, and lentils without added salt. Unsalted canned beans. Homemade soups without  salt. Eggs.  Dairy Milk. Soy milk. Ricotta cheese. Low-sodium or reduced-sodium cheeses. Yogurt.  Condiments Fresh and dried herbs and spices. Salt-free seasonings. Onion and garlic powders. Low-sodium varieties of mustard and ketchup. Lemon juice.  Fats and Oils Reduced-sodium salad dressings. Unsalted butter.  Other Unsalted popcorn and pretzels.  The items listed above may not be a complete list of recommended foods or beverages. Contact your dietitian for more options. WHAT FOODS ARE NOT RECOMMENDED? Grains Instant hot cereals. Bread stuffing, pancake, and biscuit mixes. Croutons. Seasoned rice or pasta mixes. Noodle soup cups. Boxed or frozen macaroni and cheese. Self-rising flour. Regular salted crackers. Vegetables Regular canned vegetables. Regular canned tomato sauce and paste. Regular tomato and vegetable juices. Frozen vegetables in sauces. Salted french fries. Olives. Angie Fava. Relishes. Sauerkraut. Salsa. Meat and Other Protein Products Salted, canned, smoked, spiced, or pickled meats, seafood, or fish. Bacon, ham, sausage, hot dogs, corned beef, chipped beef, and packaged luncheon meats. Salt pork. Jerky. Pickled herring. Anchovies, regular canned tuna, and sardines. Salted nuts. Dairy Processed cheese and cheese spreads. Cheese curds. Blue cheese and cottage cheese. Buttermilk.  Condiments Onion and garlic salt, seasoned salt, table salt, and sea salt. Canned and packaged gravies. Worcestershire sauce. Tartar sauce. Barbecue sauce. Teriyaki sauce. Soy sauce, including reduced sodium. Steak sauce. Fish sauce. Oyster sauce. Cocktail sauce. Horseradish. Regular ketchup and mustard. Meat flavorings and tenderizers. Bouillon cubes. Hot sauce. Tabasco sauce. Marinades. Taco seasonings. Relishes. Fats and Oils Regular salad dressings. Salted butter. Margarine. Ghee. Bacon fat.  Other Potato and tortilla chips. Corn chips and puffs. Salted popcorn and pretzels. Canned or  dried soups. Pizza. Frozen entrees and pot pies.  The items listed above may not be a complete list of foods and beverages to avoid. Contact your dietitian for more information. Document Released: 07/19/2001 Document Revised: 02/01/2013 Document Reviewed: 12/01/2012 [facility] Patient Information 2015 [place], [place]. This information is not intended to replace advice given to you by your health care provider. Make sure you discuss any questions you have with your health care provider.  Compression Stockings Compression stockings are elastic stockings that "compress" your legs. This helps to increase blood flow, decrease swelling, and reduces the chance of getting blood clots in your lower legs. Compression stockings are used:  After surgery.  If you have a history of poor circulation.  If you are prone to blood clots.  If you have varicose veins.  If you sit or are bedridden for long periods of time. WEARING COMPRESSION STOCKINGS  Your compression stockings should be worn as instructed by your caregiver.  Wearing the correct stocking size is important. Your caregiver can help measure and fit you to the correct size.  When wearing your stockings, do not allow the stockings to bunch up. This is especially important around your toes or behind your knees. Keep the stockings as smooth as possible.  Do not roll the stockings downward and leave them rolled down. This can form a restrictive band around your legs and can decrease blood flow.  The stockings should be removed once  a day for 1 hour or as instructed by your caregiver. When the stockings are taken off, inspect your legs and feet. Look for:  Open sores.  Red spots.  Puffy areas (swelling).  Anything that does not seem normal. IMPORTANT INFORMATION ABOUT COMPRESSION STOCKINGS  The compression stockings should be clean, dry, and in good condition before you put them on.  Do not put lotion on your legs or feet. This makes it  harder to put the stockings on.  Change your stockings immediately if they become wet or soiled.  Do not wear stockings that are ripped or torn.  You may hand-wash or put your stockings in the washing machine. Use cold or warm water with mild detergent. Do not bleach your stockings. They may be air-dried or dried in the dryer on low heat.  If you have pain or have a feeling of "pins and needles" in your feet or legs, you may be wearing stockings that are too tight. Call your caregiver right away. SEEK IMMEDIATE MEDICAL CARE IF:   You have numbness or tingling in your lower legs that does not get better quickly after the stockings are removed.  Your toes or feet become cold and blue.  You develop open sores or have red spots on your legs that do not go away. MAKE SURE YOU:   Understand these instructions.  Will watch your condition.  Will get help right away if you are not doing well or get worse. Document Released: 11/24/2008 Document Revised: 04/21/2011 Document Reviewed: 11/24/2008 [facility] Patient Information 2015 [place], [place]. This information is not intended to replace advice given to you by your health care provider. Make sure you discuss any questions you have with your health care provider.

## 2014-01-03 ENCOUNTER — Encounter: Payer: Self-pay | Admitting: Neurology

## 2014-01-11 ENCOUNTER — Encounter: Payer: Self-pay | Admitting: Cardiovascular Disease

## 2014-02-16 DIAGNOSIS — Z1389 Encounter for screening for other disorder: Secondary | ICD-10-CM | POA: Diagnosis not present

## 2014-02-16 DIAGNOSIS — I2510 Atherosclerotic heart disease of native coronary artery without angina pectoris: Secondary | ICD-10-CM | POA: Diagnosis not present

## 2014-02-16 DIAGNOSIS — E1151 Type 2 diabetes mellitus with diabetic peripheral angiopathy without gangrene: Secondary | ICD-10-CM | POA: Diagnosis not present

## 2014-02-16 DIAGNOSIS — G309 Alzheimer's disease, unspecified: Secondary | ICD-10-CM | POA: Diagnosis not present

## 2014-02-16 DIAGNOSIS — I10 Essential (primary) hypertension: Secondary | ICD-10-CM | POA: Diagnosis not present

## 2014-02-16 DIAGNOSIS — D649 Anemia, unspecified: Secondary | ICD-10-CM | POA: Diagnosis not present

## 2014-02-16 DIAGNOSIS — M1990 Unspecified osteoarthritis, unspecified site: Secondary | ICD-10-CM | POA: Diagnosis not present

## 2014-02-16 DIAGNOSIS — K219 Gastro-esophageal reflux disease without esophagitis: Secondary | ICD-10-CM | POA: Diagnosis not present

## 2014-02-16 DIAGNOSIS — E785 Hyperlipidemia, unspecified: Secondary | ICD-10-CM | POA: Diagnosis not present

## 2014-03-13 DIAGNOSIS — W19XXXA Unspecified fall, initial encounter: Secondary | ICD-10-CM

## 2014-03-13 HISTORY — DX: Unspecified fall, initial encounter: W19.XXXA

## 2014-03-30 DIAGNOSIS — Z6830 Body mass index (BMI) 30.0-30.9, adult: Secondary | ICD-10-CM | POA: Diagnosis not present

## 2014-03-30 DIAGNOSIS — G309 Alzheimer's disease, unspecified: Secondary | ICD-10-CM | POA: Diagnosis not present

## 2014-03-30 DIAGNOSIS — I10 Essential (primary) hypertension: Secondary | ICD-10-CM | POA: Diagnosis not present

## 2014-03-30 DIAGNOSIS — M7022 Olecranon bursitis, left elbow: Secondary | ICD-10-CM | POA: Diagnosis not present

## 2014-04-03 ENCOUNTER — Telehealth: Payer: Self-pay | Admitting: Cardiovascular Disease

## 2014-04-03 ENCOUNTER — Encounter: Payer: Medicare Other | Admitting: *Deleted

## 2014-04-03 ENCOUNTER — Telehealth: Payer: Self-pay | Admitting: Cardiology

## 2014-04-03 NOTE — Telephone Encounter (Signed)
Informed pt wife that I ordered a wirex for pt and she should have adapter by 05-29-14. She verbalized understanding.

## 2014-04-03 NOTE — Telephone Encounter (Signed)
Confirmed remote transmission w/ pt wife.   

## 2014-04-03 NOTE — Telephone Encounter (Signed)
New problem   Stated they can't get the remote ck to go through. Please call

## 2014-04-04 ENCOUNTER — Encounter: Payer: Self-pay | Admitting: Cardiology

## 2014-04-07 DIAGNOSIS — M7022 Olecranon bursitis, left elbow: Secondary | ICD-10-CM | POA: Diagnosis not present

## 2014-04-07 DIAGNOSIS — G309 Alzheimer's disease, unspecified: Secondary | ICD-10-CM | POA: Diagnosis not present

## 2014-04-11 DIAGNOSIS — M7022 Olecranon bursitis, left elbow: Secondary | ICD-10-CM | POA: Diagnosis not present

## 2014-04-14 DIAGNOSIS — M7022 Olecranon bursitis, left elbow: Secondary | ICD-10-CM | POA: Diagnosis not present

## 2014-04-21 DIAGNOSIS — M7022 Olecranon bursitis, left elbow: Secondary | ICD-10-CM | POA: Diagnosis not present

## 2014-04-21 DIAGNOSIS — E785 Hyperlipidemia, unspecified: Secondary | ICD-10-CM | POA: Diagnosis not present

## 2014-04-21 DIAGNOSIS — R2689 Other abnormalities of gait and mobility: Secondary | ICD-10-CM | POA: Diagnosis not present

## 2014-04-21 DIAGNOSIS — Z6829 Body mass index (BMI) 29.0-29.9, adult: Secondary | ICD-10-CM | POA: Diagnosis not present

## 2014-04-21 DIAGNOSIS — R634 Abnormal weight loss: Secondary | ICD-10-CM | POA: Diagnosis not present

## 2014-04-21 DIAGNOSIS — G309 Alzheimer's disease, unspecified: Secondary | ICD-10-CM | POA: Diagnosis not present

## 2014-05-02 DIAGNOSIS — G309 Alzheimer's disease, unspecified: Secondary | ICD-10-CM | POA: Diagnosis not present

## 2014-05-02 DIAGNOSIS — M7022 Olecranon bursitis, left elbow: Secondary | ICD-10-CM | POA: Diagnosis not present

## 2014-05-02 DIAGNOSIS — M6281 Muscle weakness (generalized): Secondary | ICD-10-CM | POA: Diagnosis not present

## 2014-05-02 DIAGNOSIS — R2689 Other abnormalities of gait and mobility: Secondary | ICD-10-CM | POA: Diagnosis not present

## 2014-05-05 DIAGNOSIS — R2689 Other abnormalities of gait and mobility: Secondary | ICD-10-CM | POA: Diagnosis not present

## 2014-05-05 DIAGNOSIS — M6281 Muscle weakness (generalized): Secondary | ICD-10-CM | POA: Diagnosis not present

## 2014-05-10 DIAGNOSIS — M6281 Muscle weakness (generalized): Secondary | ICD-10-CM | POA: Diagnosis not present

## 2014-05-10 DIAGNOSIS — R2689 Other abnormalities of gait and mobility: Secondary | ICD-10-CM | POA: Diagnosis not present

## 2014-05-11 DIAGNOSIS — M7022 Olecranon bursitis, left elbow: Secondary | ICD-10-CM | POA: Diagnosis not present

## 2014-05-12 ENCOUNTER — Telehealth: Payer: Self-pay | Admitting: Cardiovascular Disease

## 2014-05-12 NOTE — Telephone Encounter (Signed)
Returned call to pt's wife. She states pt has had poor appetite x2 days. Has eaten toast, crackers, little else. "A little sweaty", tired. He does not state nausea explicitly but wife cites his dementia doesn't help w/ his eliciting symptoms.  Pt not apparently short of breath. Wife notes viral illness has been going around the facility where they live.   Advised to increase fluids (water, augment w/ gatorade/sports drink) & offer bland foods (whatever he can tolerate). She voiced understanding. Advised I would route to physician for addtl recommendations.

## 2014-05-12 NOTE — Telephone Encounter (Signed)
Jon Davis is calling Mr. Diveley is not feeling well. Sweaty and no energy and not very clear about things. Would like to know what should they do . Please call .[name]   Thanks

## 2014-05-16 NOTE — Telephone Encounter (Signed)
Called wife for update, she reports he's eating a little more this week, not appreciably more though, skipping some meals & eating lightly w/ others.  He is drinking fluids as advised.  She contacted PCP, states they were not concerned & to continue plan - no additional advice given.

## 2014-05-16 NOTE — Telephone Encounter (Signed)
Let's check him, since he isn't feeling well. Device clinic is OK

## 2014-05-16 NOTE — Telephone Encounter (Signed)
Any more news, is he feeling any better?

## 2014-05-16 NOTE — Telephone Encounter (Signed)
Seems to be overdue for Pacemaker check. Can they do a remote transmission please.

## 2014-05-16 NOTE — Telephone Encounter (Signed)
Spoke to patient about sending a remote transmission. Wife stated that the pt tried to send in February, but it was unsuccessful. She stated that they were supposed to be getting an "extra piece" for the monitor, a WireX, which had not yet been received. I informed her that the cell adaptors have been on back order, but should arrive shortly. Will see if [facility] would like for patient to come into the office for the time being. Wife voiced understanding.

## 2014-05-18 DIAGNOSIS — M6281 Muscle weakness (generalized): Secondary | ICD-10-CM | POA: Diagnosis not present

## 2014-05-18 DIAGNOSIS — R2689 Other abnormalities of gait and mobility: Secondary | ICD-10-CM | POA: Diagnosis not present

## 2014-05-19 DIAGNOSIS — R2689 Other abnormalities of gait and mobility: Secondary | ICD-10-CM | POA: Diagnosis not present

## 2014-05-19 DIAGNOSIS — M6281 Muscle weakness (generalized): Secondary | ICD-10-CM | POA: Diagnosis not present

## 2014-05-19 NOTE — Telephone Encounter (Signed)
Attempted call. Unable to LM.

## 2014-05-23 DIAGNOSIS — R2689 Other abnormalities of gait and mobility: Secondary | ICD-10-CM | POA: Diagnosis not present

## 2014-05-23 DIAGNOSIS — M1990 Unspecified osteoarthritis, unspecified site: Secondary | ICD-10-CM | POA: Diagnosis not present

## 2014-05-23 DIAGNOSIS — Z5181 Encounter for therapeutic drug level monitoring: Secondary | ICD-10-CM | POA: Diagnosis not present

## 2014-05-23 DIAGNOSIS — D649 Anemia, unspecified: Secondary | ICD-10-CM | POA: Diagnosis not present

## 2014-05-23 DIAGNOSIS — I482 Chronic atrial fibrillation: Secondary | ICD-10-CM | POA: Diagnosis not present

## 2014-05-23 DIAGNOSIS — R634 Abnormal weight loss: Secondary | ICD-10-CM | POA: Diagnosis not present

## 2014-05-23 DIAGNOSIS — E785 Hyperlipidemia, unspecified: Secondary | ICD-10-CM | POA: Diagnosis not present

## 2014-05-23 DIAGNOSIS — G2581 Restless legs syndrome: Secondary | ICD-10-CM | POA: Diagnosis not present

## 2014-05-23 DIAGNOSIS — Z1389 Encounter for screening for other disorder: Secondary | ICD-10-CM | POA: Diagnosis not present

## 2014-05-23 DIAGNOSIS — E1151 Type 2 diabetes mellitus with diabetic peripheral angiopathy without gangrene: Secondary | ICD-10-CM | POA: Diagnosis not present

## 2014-05-23 DIAGNOSIS — Z79899 Other long term (current) drug therapy: Secondary | ICD-10-CM | POA: Diagnosis not present

## 2014-05-23 DIAGNOSIS — K219 Gastro-esophageal reflux disease without esophagitis: Secondary | ICD-10-CM | POA: Diagnosis not present

## 2014-05-24 ENCOUNTER — Ambulatory Visit: Payer: Medicare Other | Admitting: Adult Health

## 2014-05-24 DIAGNOSIS — R2689 Other abnormalities of gait and mobility: Secondary | ICD-10-CM | POA: Diagnosis not present

## 2014-05-24 DIAGNOSIS — M6281 Muscle weakness (generalized): Secondary | ICD-10-CM | POA: Diagnosis not present

## 2014-05-29 ENCOUNTER — Ambulatory Visit: Payer: Medicare Other | Admitting: Adult Health

## 2014-05-29 NOTE — Telephone Encounter (Signed)
Appt made for 4/28 @ 11:30.

## 2014-05-30 ENCOUNTER — Ambulatory Visit: Payer: Medicare Other | Admitting: Adult Health

## 2014-06-08 ENCOUNTER — Encounter: Payer: Self-pay | Admitting: Cardiovascular Disease

## 2014-06-08 ENCOUNTER — Ambulatory Visit (INDEPENDENT_AMBULATORY_CARE_PROVIDER_SITE_OTHER): Payer: Medicare Other | Admitting: *Deleted

## 2014-06-08 DIAGNOSIS — Z950 Presence of cardiac pacemaker: Secondary | ICD-10-CM

## 2014-06-08 DIAGNOSIS — I480 Paroxysmal atrial fibrillation: Secondary | ICD-10-CM

## 2014-06-08 LAB — MDC_IDC_ENUM_SESS_TYPE_INCLINIC
Brady Statistic AP VP Percent: 1.47 %
Brady Statistic AP VS Percent: 82.08 %
Brady Statistic AS VP Percent: 0.17 %
Brady Statistic AS VS Percent: 16.28 %
Brady Statistic RV Percent Paced: 1.64 %
Lead Channel Impedance Value: 488 Ohm
Lead Channel Pacing Threshold Pulse Width: 0.4 ms
Lead Channel Pacing Threshold Pulse Width: 0.4 ms
Lead Channel Setting Pacing Amplitude: 2 V
Lead Channel Setting Pacing Amplitude: 2.5 V
Lead Channel Setting Pacing Pulse Width: 0.4 ms
Lead Channel Setting Sensing Sensitivity: 0.9 mV
MDC IDC MSMT BATTERY VOLTAGE: 2.97 V
MDC IDC MSMT LEADCHNL RA IMPEDANCE VALUE: 400 Ohm
MDC IDC MSMT LEADCHNL RA PACING THRESHOLD AMPLITUDE: 1 V
MDC IDC MSMT LEADCHNL RA SENSING INTR AMPL: 3.6 mV
MDC IDC MSMT LEADCHNL RV PACING THRESHOLD AMPLITUDE: 1 V
MDC IDC MSMT LEADCHNL RV SENSING INTR AMPL: 8.9 mV
MDC IDC SESS DTM: 20160428143103
MDC IDC STAT BRADY RA PERCENT PACED: 83.55 %
Zone Setting Detection Interval: 350 ms
Zone Setting Detection Interval: 400 ms

## 2014-06-08 NOTE — Progress Notes (Signed)
Pacemaker check in clinic. Normal device function. Thresholds, sensing, impedances consistent with previous measurements. Device programmed to maximize longevity. 2 AT/AF---longest 44sec. 1 NSVT---10 beats. Device programmed at appropriate safety margins. Histogram distribution appropriate for patient activity level. Device programmed to optimize intrinsic conduction. Remaining power 2.97V. [facility] 09/07/14 & ROV w/ MC in 95mo.

## 2014-06-19 ENCOUNTER — Other Ambulatory Visit: Payer: Self-pay | Admitting: Neurology

## 2014-06-29 ENCOUNTER — Ambulatory Visit (INDEPENDENT_AMBULATORY_CARE_PROVIDER_SITE_OTHER): Payer: Medicare Other | Admitting: Nurse Practitioner

## 2014-06-29 ENCOUNTER — Encounter: Payer: Self-pay | Admitting: Nurse Practitioner

## 2014-06-29 VITALS — HR 78 | Ht 69.75 in | Wt 203.0 lb

## 2014-06-29 DIAGNOSIS — G478 Other sleep disorders: Secondary | ICD-10-CM

## 2014-06-29 DIAGNOSIS — G319 Degenerative disease of nervous system, unspecified: Secondary | ICD-10-CM | POA: Diagnosis not present

## 2014-06-29 DIAGNOSIS — R413 Other amnesia: Secondary | ICD-10-CM

## 2014-06-29 DIAGNOSIS — G4752 REM sleep behavior disorder: Secondary | ICD-10-CM

## 2014-06-29 DIAGNOSIS — G2581 Restless legs syndrome: Secondary | ICD-10-CM | POA: Diagnosis not present

## 2014-06-29 MED ORDER — PRAMIPEXOLE DIHYDROCHLORIDE 0.125 MG PO TABS: 0.2500 mg | ORAL_TABLET | Freq: Every evening | ORAL | Status: AC

## 2014-06-29 MED ORDER — MEMANTINE HCL ER 28 MG PO CP24: 28.0000 mg | ORAL_CAPSULE | Freq: Every day | ORAL | Status: DC

## 2014-06-29 NOTE — Progress Notes (Signed)
[name]  PATIENT: Jon Davis DOB: [birth year]   REASON FOR VISIT:follow up for memory loss restless legs HISTORY FROM:Caregiver from  [facility]    HISTORY OF PRESENT ILLNESS:Jon Davis is an 81 year old male with a history of memory loss and RLS. He returns today for follow-up. He was last seen in this office 11/25/2013.He is currently taking Aricept and Namenda tolerating it well. He reports that his memory is about the same. He states that he notices the most with his short term memory. He forgets people names and has to ask questions over and over. He is able to complete all ADLs without assistance. Denies having to give up anything due to his memory. He no longer operates a motor vehicle but that was not do to his memory. They gave their vehicle to the  grandson. They have nurses that come in from [facility] daily. He continues to take Mirapex and xanax for RLS and reports that this is working well. He states that he is sleeping well now. His appetite is good. Denies any new medical issues.   HISTORY 05/26/13 (CM): 81 year old male returns for followup. He was initially evaluated for memory loss by Dr. Brett Fairy 11/24/2012. He continues to have short-term memory problems. He also has restless leg syndrome is under better control with increasing his Mirapex. 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 He is ambulating with a cane, he denies any falls. He is alone at today's visit. He denies any side effects to his Aricept or other medications. He has not had compulsive behaviors on Mirapex, he returns for reevaluation    REVIEW OF SYSTEMS: Full 14 system review of systems performed and notable only for those listed, all others are neg:  Constitutional: neg  Cardiovascular: neg Ear/Nose/Throat: neg  Skin: neg Eyes: neg Respiratory: neg Gastroitestinal: neg  Hematology/Lymphatic: neg  Endocrine:  neg Musculoskeletal:neg Allergy/Immunology: neg Neurological: memory loss Psychiatric: neg Sleep : restless legs   ALLERGIES: No Known Allergies  HOME MEDICATIONS: Outpatient Prescriptions Prior to Visit  Medication Sig Dispense Refill  . ALPRAZolam (XANAX) 0.25 MG tablet Take 0.25 mg by mouth 2 (two) times daily as needed for anxiety.    [name] amLODipine (NORVASC) 5 MG tablet Take 2.5 mg by mouth daily.     [name] aspirin 81 MG tablet Take 81 mg by mouth daily.    [name] atorvastatin (LIPITOR) 20 MG tablet Take 20 mg by mouth daily.    . benzonatate (TESSALON) 100 MG capsule Take 100 mg by mouth 2 (two) times daily. Take 2 capsules by mouth twice daily as needed for cough    . donepezil (ARICEPT) 10 MG tablet TAKE 2 TABLETS (20 MG TOTAL) BY MOUTH DAILY. 180 tablet 1  . ferrous sulfate 325 (65 FE) MG tablet Take 325 mg by mouth 2 (two) times daily.    [name] HYDROcodone-acetaminophen (NORCO/VICODIN) 5-325 MG per tablet Take 1 tablet by mouth 2 (two) times daily.    . isosorbide mononitrate (IMDUR) 30 MG 24 hr tablet Take 30 mg by mouth daily.    . Memantine HCl ER (NAMENDA XR) 28 MG CP24 Take 1 capsule by mouth daily.    . metoprolol succinate (TOPROL-XL) 50 MG 24 hr tablet Take 50 mg by mouth daily. Take with or immediately following a meal.    . mupirocin cream (BACTROBAN) 2 % Apply 1 application topically 2 (two) times daily. Apply twice daily    .  pantoprazole (PROTONIX) 40 MG tablet Take 40 mg by mouth daily.    . pramipexole (MIRAPEX) 0.125 MG tablet Take 2 tablets (0.25 mg total) by mouth every evening. Two hours before bedtime 90 tablet 3  . sertraline (ZOLOFT) 50 MG tablet Take 50 mg by mouth daily.    [name] glipiZIDE (GLUCOTROL XL) 5 MG 24 hr tablet Take 5 mg by mouth daily.     No facility-administered medications prior to visit.    PAST MEDICAL HISTORY: Past Medical History  Diagnosis Date  . Diabetes   . Hypertension   . Depression   . Dementia in Alzheimer's disease   . Hyperlipidemia    . Episodic VTach, pacer     Followed by Dr. Avon Gully  . Urinary incontinence   . Anemia 06/2012  . Elevated PSA   . Microalbuminuria   . BPH (benign prostatic hyperplasia)     Followed by Dr. Matilde Sprang  . Hiatal hernia 02/26/2010  . Gastritis 02/26/2010    moderate  . Diverticulosis 02/26/2010    Sigmoid colon  . Duodenitis 02/26/2010  . Brady-tachy syndrome     04/17/2009 MDT pacer Enrhythmatr fib  . Atrial fibrillation   . CAD (coronary artery disease)   . Fall 03/2014    elbow fluid     PAST SURGICAL HISTORY: Past Surgical History  Procedure Laterality Date  . Appendectomy  1948  . Permanent pacemaker insertion  04/17/2009    MDT Enrhythm  . Cholecystectomy    . Cataracts    . Cardiac catheterization  07/07/2007    mild to mod. ca+ w/narrowing of 20% ostium of second diagonal,diffuse 80% stenosis small caliber infer. branch,20% mid LAD,60-70% narrowing mid atrioventricular groove CX,mild 20-30% RCA  . Carotid doppler  10/01/2006    0-49% right bulb,prox,ECA & prox ICA,0-49% left subclavian bulb & prox ICA,right & left ICA abn. resistance  . Nm myoview ltd  10/01/2010    No ischemia    FAMILY HISTORY: Family History  Problem Relation Age of Onset  . Heart disease Father     Deceased  . Alzheimer's disease Mother     Deceased    SOCIAL HISTORY: History   Social History  . Marital Status: Married    Spouse Name: Jon Davis   . Number of Children: 2  . Years of Education: 12   Occupational History  .    . retired    Social History Main Topics  . Smoking status: Former Research scientist (life sciences)  . Smokeless tobacco: Former Systems developer    Quit date: 02/09/1974  . Alcohol Use: No  . Drug Use: No  . Sexual Activity: No   Other Topics Concern  . Not on file   Social History Narrative   Patient lives at home with wife Jon Davis.    Patient has 2 children.    Patient has a high school education.    Patient retired.    Patient is right handed.      PHYSICAL EXAM  Filed Vitals:    06/29/14 1021  Pulse: 78  Height: 5' 9.75" (1.772 m)  Weight: 203 lb (92.08 kg)   Body mass index is 29.32 kg/(m^2). Generalized: Well developed, in no acute distress   Neurological examination  Mentation: Alert oriented to time, place, history taking. Follows all commands speech and language fluent. MOCA 19/30 AFT 8. Cranial nerve II-XII: Pupils were equal round reactive to light. Extraocular movements were full, visual field were full on confrontational test. Facial sensation and strength were normal. Uvula  tongue midline. Head turning and shoulder shrug were normal and symmetric. Motor: The motor testing reveals 5 over 5 strength of all 4 extremities. Good symmetric motor tone is noted throughout.  Sensory: Sensory testing is intact to soft touch on all 4 extremities. No evidence of extinction is noted.  Coordination: Cerebellar testing reveals good finger-nose-finger and heel-to-shin bilaterally.  Gait and station: Patient able to stand from a sitting position without assistance. Gait is slightly wide based. Tandem gait not attempted. Romberg is negative. No drift is seen. Ambulates with single-point cane Reflexes: Deep tendon reflexes are symmetric and normal bilaterally.     DIAGNOSTIC DATA (LABS, IMAGING, TESTING) -  ASSESSMENT AND PLAN  81 y.o. year old male  has a past medical history of Diabetes; Hypertension; Depression; Dementia in Alzheimer's disease; Hyperlipidemia; Episodic VTach, pacer; Urinary incontinence; Anemia (06/2012); Elevated PSA; Microalbuminuria; BPH (benign prostatic hyperplasia); Hiatal hernia (02/26/2010); Gastritis (02/26/2010); Diverticulosis (02/26/2010); Duodenitis (02/26/2010); Brady-tachy syndrome; Atrial fibrillation; CAD (coronary artery disease); and Fall (03/2014). here to follow up for his memory loss and restless legs syndrome.  Continue Aricept and Namenda at current doses for memory  Continue  to take Mirapex for restless legs Use cane at  all times for safe ambulation at high risk for falls Follow-up in 6 months Dennie Bible, [facility], [facility], [facility] [address], [place],  [address] [phone]

## 2014-06-29 NOTE — Patient Instructions (Signed)
Continue Aricept and Namenda at current doses for memory  continue  to take Mirapex for restless legs Follow-up in 6 months

## 2014-06-29 NOTE — Progress Notes (Signed)
I agree with the assessment and plan as directed by NP .The patient is known to me .   Annjanette Wertenberger, MD  

## 2014-09-07 ENCOUNTER — Telehealth: Payer: Self-pay | Admitting: *Deleted

## 2014-09-07 ENCOUNTER — Ambulatory Visit: Payer: Medicare Other | Admitting: *Deleted

## 2014-09-07 NOTE — Telephone Encounter (Signed)
Caregiver, Benjamine Mola, attempting to send manual remote w/ new WireX. She has properly sent transmission. As we spoke, I could hear carelink trying to send (making "fax" noise). I advised her to continue to wait for [place] to send remote. She's aware "target" will illuminate once successful.   Earnestine Leys my direct #.

## 2014-09-13 ENCOUNTER — Telehealth: Payer: Self-pay | Admitting: Cardiology

## 2014-09-13 NOTE — Telephone Encounter (Signed)
Spoke w/ pt caregiver and he stated that pt missed 09-07-14 remote stated that he has spoken to several people and was informed that monitor was hooked up correctly. Pt caregiver requesting a sooner appt b/c pt has not been feeling well. States that pt is weak and not feeling well. Pt caregiver requesting sooner appt and agreed to Wednesday 09-20-2014 at 10:30 AM.

## 2014-09-14 NOTE — Telephone Encounter (Signed)
Pt's caregiver has appt to have device checked in office 09/20/14. Benjamine Mola now believes she had [place] connected incorrectly. She states she had WireX connected to a wall phone jack. She now understands nothing should be connected to a phone wall jack. She will call back tomorrow to attempt to properly send a remote .

## 2014-09-15 ENCOUNTER — Ambulatory Visit (INDEPENDENT_AMBULATORY_CARE_PROVIDER_SITE_OTHER): Payer: Medicare Other | Admitting: *Deleted

## 2014-09-15 ENCOUNTER — Encounter: Payer: Self-pay | Admitting: Cardiovascular Disease

## 2014-09-15 DIAGNOSIS — Z950 Presence of cardiac pacemaker: Secondary | ICD-10-CM

## 2014-09-15 DIAGNOSIS — I480 Paroxysmal atrial fibrillation: Secondary | ICD-10-CM | POA: Diagnosis not present

## 2014-09-15 DIAGNOSIS — I495 Sick sinus syndrome: Secondary | ICD-10-CM | POA: Diagnosis not present

## 2014-09-15 NOTE — Progress Notes (Signed)
Remote pacemaker transmission.   

## 2014-09-18 LAB — CUP PACEART REMOTE DEVICE CHECK
Brady Statistic AP VP Percent: 4.82 %
Brady Statistic AS VP Percent: 0.22 %
Brady Statistic AS VS Percent: 1.24 %
Brady Statistic RA Percent Paced: 98.55 %
Date Time Interrogation Session: 20160805130806
Lead Channel Impedance Value: 424 Ohm
Lead Channel Impedance Value: 544 Ohm
Lead Channel Sensing Intrinsic Amplitude: 1.877 mV
Lead Channel Sensing Intrinsic Amplitude: 8.528 mV
Lead Channel Setting Sensing Sensitivity: 0.9 mV
MDC IDC MSMT BATTERY VOLTAGE: 2.97 V
MDC IDC SET LEADCHNL RA PACING AMPLITUDE: 2 V
MDC IDC SET LEADCHNL RV PACING AMPLITUDE: 2.5 V
MDC IDC SET LEADCHNL RV PACING PULSEWIDTH: 0.4 ms
MDC IDC SET ZONE DETECTION INTERVAL: 350 ms
MDC IDC STAT BRADY AP VS PERCENT: 93.73 %
MDC IDC STAT BRADY RV PERCENT PACED: 5.04 %
Zone Setting Detection Interval: 400 ms

## 2014-09-18 NOTE — Telephone Encounter (Signed)
Remote received 09/15/14.

## 2014-09-18 NOTE — Progress Notes (Signed)
Pacer remote received.

## 2014-09-26 DIAGNOSIS — G309 Alzheimer's disease, unspecified: Secondary | ICD-10-CM | POA: Diagnosis not present

## 2014-09-26 DIAGNOSIS — I2510 Atherosclerotic heart disease of native coronary artery without angina pectoris: Secondary | ICD-10-CM | POA: Diagnosis not present

## 2014-09-26 DIAGNOSIS — R2689 Other abnormalities of gait and mobility: Secondary | ICD-10-CM | POA: Diagnosis not present

## 2014-09-26 DIAGNOSIS — D649 Anemia, unspecified: Secondary | ICD-10-CM | POA: Diagnosis not present

## 2014-09-26 DIAGNOSIS — E1151 Type 2 diabetes mellitus with diabetic peripheral angiopathy without gangrene: Secondary | ICD-10-CM | POA: Diagnosis not present

## 2014-09-26 DIAGNOSIS — E785 Hyperlipidemia, unspecified: Secondary | ICD-10-CM | POA: Diagnosis not present

## 2014-09-26 DIAGNOSIS — K222 Esophageal obstruction: Secondary | ICD-10-CM | POA: Diagnosis not present

## 2014-09-26 DIAGNOSIS — I10 Essential (primary) hypertension: Secondary | ICD-10-CM | POA: Diagnosis not present

## 2014-09-26 DIAGNOSIS — G319 Degenerative disease of nervous system, unspecified: Secondary | ICD-10-CM | POA: Diagnosis not present

## 2014-10-04 ENCOUNTER — Encounter: Payer: Self-pay | Admitting: Cardiology

## 2014-10-04 DIAGNOSIS — L570 Actinic keratosis: Secondary | ICD-10-CM | POA: Diagnosis not present

## 2014-10-04 DIAGNOSIS — L814 Other melanin hyperpigmentation: Secondary | ICD-10-CM | POA: Diagnosis not present

## 2014-10-04 DIAGNOSIS — L309 Dermatitis, unspecified: Secondary | ICD-10-CM | POA: Diagnosis not present

## 2014-10-04 DIAGNOSIS — L821 Other seborrheic keratosis: Secondary | ICD-10-CM | POA: Diagnosis not present

## 2014-10-04 DIAGNOSIS — D1801 Hemangioma of skin and subcutaneous tissue: Secondary | ICD-10-CM | POA: Diagnosis not present

## 2014-10-04 DIAGNOSIS — Z85828 Personal history of other malignant neoplasm of skin: Secondary | ICD-10-CM | POA: Diagnosis not present

## 2014-11-13 DIAGNOSIS — Z23 Encounter for immunization: Secondary | ICD-10-CM | POA: Diagnosis not present

## 2014-12-15 ENCOUNTER — Encounter: Payer: Self-pay | Admitting: Cardiovascular Disease

## 2014-12-18 ENCOUNTER — Other Ambulatory Visit: Payer: Self-pay | Admitting: Neurology

## 2014-12-19 ENCOUNTER — Encounter: Payer: Medicare Other | Admitting: Cardiovascular Disease

## 2014-12-26 ENCOUNTER — Encounter: Payer: Medicare Other | Admitting: Cardiovascular Disease

## 2014-12-26 DIAGNOSIS — R0989 Other specified symptoms and signs involving the circulatory and respiratory systems: Secondary | ICD-10-CM

## 2015-01-01 ENCOUNTER — Telehealth: Payer: Self-pay | Admitting: *Deleted

## 2015-01-01 ENCOUNTER — Ambulatory Visit: Payer: Medicare Other | Admitting: Nurse Practitioner

## 2015-01-01 NOTE — Telephone Encounter (Signed)
Pt no showed for today's appt

## 2015-01-02 ENCOUNTER — Encounter: Payer: Self-pay | Admitting: Nurse Practitioner

## 2015-01-22 ENCOUNTER — Other Ambulatory Visit: Payer: Self-pay | Admitting: Neurology

## 2015-02-15 ENCOUNTER — Telehealth: Payer: Self-pay | Admitting: Neurology

## 2015-02-15 NOTE — Telephone Encounter (Signed)
Jon Davis with [facility] medication management called , states due to insurance changes there has been some issues with some of the pts medications. They are requesting a call back .  [phone]

## 2015-02-16 NOTE — Telephone Encounter (Signed)
I called and LMVM for Jon Davis returning her call about medications.

## 2015-02-20 NOTE — Telephone Encounter (Signed)
I spoke to wife and she has appt with Dr. Krista Blue tomorrow for BOTOX.   She will make appt for her husband then as well with CM/NP.  Can we go ahead and send a written order to [facility] for pt to take donepezil 23mg  until pt is seen.  This goes to [facility] and not to pharmacy.  Joylene Igo  [phone] Attention Tory Emerald.

## 2015-02-20 NOTE — Telephone Encounter (Signed)
I spoke to [employer].  Pt has [employer].   Some change in the pharmacy [facility].  Order was placed for donepezil 23mg  daily from Dr, Osborne Casco to pharmacy, but he stated needed to contact us for the written order for pt to take this.  Once this is done, would like for [place] to send prescription for the more cost effective donezepil 10mg  (taking 2 tabs po daily) to the [facility].     [facility] is following pt for medications and home care.     Pt no showed last appt and I will call pt to reschedule this may be addressed then.

## 2015-02-20 NOTE — Telephone Encounter (Signed)
Next appt with Dr. Brett Fairy, already seen 3 times by NP. Can you just print off RX for wife tomorrow  Or send to [facility] ok with me.

## 2015-02-20 NOTE — Telephone Encounter (Signed)
Jon Davis returned Sandy's call

## 2015-02-21 MED ORDER — DONEPEZIL HCL 23 MG PO TABS
23.0000 mg | ORAL_TABLET | Freq: Every day | ORAL | Status: DC
Start: 2015-02-21 — End: 2016-02-07

## 2015-02-21 NOTE — Telephone Encounter (Signed)
Order for donezepil 23mg  po qhs # 30 no refill made to fax to [facility].  Pt would like donepezil 10mg  (2 tabs po bedtime ) due to cost for future refills.   Will need appt with MD next.

## 2015-02-21 NOTE — Telephone Encounter (Signed)
Faxed written order for donepezil 23mg  po daily to Tory Emerald at [place]  [phone] (successful transmission).

## 2015-03-23 ENCOUNTER — Encounter: Payer: Self-pay | Admitting: *Deleted

## 2015-04-26 ENCOUNTER — Encounter: Payer: Self-pay | Admitting: Nurse Practitioner

## 2015-04-26 ENCOUNTER — Ambulatory Visit (INDEPENDENT_AMBULATORY_CARE_PROVIDER_SITE_OTHER): Payer: Medicare Other | Admitting: Nurse Practitioner

## 2015-04-26 VITALS — BP 119/71 | HR 64 | Ht 69.75 in | Wt 213.4 lb

## 2015-04-26 DIAGNOSIS — R413 Other amnesia: Secondary | ICD-10-CM

## 2015-04-26 DIAGNOSIS — G2581 Restless legs syndrome: Secondary | ICD-10-CM | POA: Diagnosis not present

## 2015-04-26 NOTE — Progress Notes (Signed)
[name]  PATIENT: Jon Davis DOB: April 17, 1926   REASON FOR VISIT: Follow-up for memory loss restless legs REM sleep behavior disorder HISTORY FROM: Caregiver from [facility], patient and wife    HISTORY OF PRESENT ILLNESS:Jon Davis is an 82 year old male with a history of memory loss and RLS. He returns today for follow-up. He was last seen in this office 06/29/14. He is currently taking Aricept and Namenda tolerating it well. He reports that his memory has declined.  He states that he notices the most with his short term memory. He forgets people names and has to ask questions over and over. He is able to complete all ADLs with assistance. Denies having to give up anything due to his memory. He no longer operates a motor vehicle but that was not do to his memory. They gave their vehicle to the grandson. They have nurses that come in from [facility] twice weekly. He continues to take Mirapex and xanax for RLS and reports that this is working well. He states that he is sleeping well.[name] His appetite is good. He returns for reevaluation   HISTORY 05/26/13 (Jon Davis): 82 year old male returns for followup. He was initially evaluated for memory loss by Jon Davis 11/24/2012. He continues to have short-term memory problems. He also has restless leg syndrome is under better control with increasing his Mirapex. 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 He is ambulating with a cane, he denies any falls. He is alone at today's visit. He denies any side effects to his Aricept or other medications. He has not had compulsive behaviors on Mirapex, he returns for reevaluation    REVIEW OF SYSTEMS: Full 14 system review of systems performed and notable only for those listed, all others are neg:  Constitutional: Fatigue  Cardiovascular: neg Ear/Nose/Throat: Hearing loss  Skin: neg Eyes: neg Respiratory:  neg Gastroitestinal: neg  Hematology/Lymphatic: neg  Endocrine: neg Musculoskeletal: Walking difficulty Allergy/Immunology: neg Neurological: Memory loss Psychiatric: Anxiety Sleep : Restless leg   ALLERGIES: No Known Allergies  HOME MEDICATIONS: Outpatient Prescriptions Prior to Visit  Medication Sig Dispense Refill  . ALPRAZolam (XANAX) 0.25 MG tablet Take 0.25 mg by mouth 2 (two) times daily as needed for anxiety.    [name] amLODipine (NORVASC) 5 MG tablet Take 2.5 mg by mouth daily.     [name] aspirin 81 MG tablet Take 81 mg by mouth daily.    [name] atorvastatin (LIPITOR) 20 MG tablet Take 20 mg by mouth daily.    . benzonatate (TESSALON) 100 MG capsule Take 100 mg by mouth 2 (two) times daily. Take 2 capsules by mouth twice daily as needed for cough    . donepezil (ARICEPT) 10 MG tablet TAKE 2 TABLETS (20 MG TOTAL) BY MOUTH DAILY. 60 tablet 0  . donepezil (ARICEPT) 23 MG TABS tablet Take 1 tablet (23 mg total) by mouth at bedtime. 30 tablet 0  . ferrous sulfate 325 (65 FE) MG tablet Take 325 mg by mouth 2 (two) times daily.    [name] glipiZIDE (GLUCOTROL) 10 MG tablet Take 10 mg by mouth daily.  5  . HYDROcodone-acetaminophen (NORCO/VICODIN) 5-325 MG per tablet Take 1 tablet by mouth 2 (two) times daily.    . isosorbide mononitrate (IMDUR) 30 MG 24 hr tablet Take 30 mg by mouth daily.    . memantine (NAMENDA XR) 28 MG CP24 24 hr capsule Take 1 capsule (28 mg total) by mouth  daily. 30 capsule 6  . metoprolol succinate (TOPROL-XL) 50 MG 24 hr tablet Take 50 mg by mouth daily. Take with or immediately following a meal.    . mirtazapine (REMERON) 7.5 MG tablet Take 7.5 mg by mouth at bedtime.  1  . mupirocin cream (BACTROBAN) 2 % Apply 1 application topically 2 (two) times daily. Apply twice daily    . pantoprazole (PROTONIX) 40 MG tablet Take 40 mg by mouth daily.    . pramipexole (MIRAPEX) 0.125 MG tablet Take 2 tablets (0.25 mg total) by mouth every evening. Two hours before bedtime 90 tablet 3  .  sertraline (ZOLOFT) 50 MG tablet Take 50 mg by mouth daily.     No facility-administered medications prior to visit.    PAST MEDICAL HISTORY: Past Medical History  Diagnosis Date  . Diabetes ([place])   . Hypertension   . Depression   . Dementia in Alzheimer's disease   . Hyperlipidemia   . Episodic VTach, pacer     Followed by Dr. Avon Davis  . Urinary incontinence   . Anemia 06/2012  . Elevated PSA   . Microalbuminuria   . BPH (benign prostatic hyperplasia)     Followed by Jon Davis  . Hiatal hernia 02/26/2010  . Gastritis 02/26/2010    moderate  . Diverticulosis 02/26/2010    Sigmoid colon  . Duodenitis 02/26/2010  . Brady-tachy syndrome (Jon Davis)     04/17/2009 MDT pacer Enrhythmatr fib  . Atrial fibrillation ([place])   . CAD (coronary artery disease)   . Fall 03/2014    elbow fluid     PAST SURGICAL HISTORY: Past Surgical History  Procedure Laterality Date  . Appendectomy  1948  . Permanent pacemaker insertion  04/17/2009    MDT Enrhythm  . Cholecystectomy    . Cataracts    . Cardiac catheterization  07/07/2007    mild to mod. ca+ w/narrowing of 20% ostium of second diagonal,diffuse 80% stenosis small caliber infer. branch,20% mid LAD,60-70% narrowing mid atrioventricular groove CX,mild 20-30% RCA  . Carotid doppler  10/01/2006    0-49% right bulb,prox,ECA & prox ICA,0-49% left subclavian bulb & prox ICA,right & left ICA abn. resistance  . Nm myoview ltd  10/01/2010    No ischemia    FAMILY HISTORY: Family History  Problem Relation Age of Onset  . Heart disease Father     Deceased  . Alzheimer's disease Mother     Deceased    SOCIAL HISTORY: Social History   Social History  . Marital Status: Married    Spouse Name: Jon Davis   . Number of Children: 2  . Years of Education: 12   Occupational History  .    . retired    Social History Main Topics  . Smoking status: Former Research scientist (life sciences)  . Smokeless tobacco: Former Systems developer    Quit date: 02/09/1974  . Alcohol Use: No    . Drug Use: No  . Sexual Activity: No   Other Topics Concern  . Not on file   Social History Narrative   Patient lives at home with wife [name].    Patient has 2 children.    Patient has a high school education.    Patient retired.    Patient is right handed.      PHYSICAL EXAM  Filed Vitals:   04/26/15 1303  BP: 119/71  Pulse: 64  Height: 5' 9.75" (1.772 m)  Weight: 213 lb 6.4 oz (96.798 kg)   Body mass index is 30.83 kg/(m^2).  Generalized: Well developed, in no acute distress   Neurological examination  Mentation: Alert oriented to time, place, history taking. Follows all commands speech and language fluent. [place] 13/30 Last 19/30 . Cranial nerve II-XII: Pupils were equal round reactive to light. Extraocular movements were full, visual field were full on confrontational test. Facial sensation and strength were normal. Uvula tongue midline. Head turning and shoulder shrug were normal and symmetric. Motor: The motor testing reveals 5 over 5 strength of all 4 extremities. Good symmetric motor tone is noted throughout.  Sensory: Sensory testing is intact to soft touch on all 4 extremities. No evidence of extinction is noted.  Coordination: Cerebellar testing reveals good finger-nose-finger   Gait and station: Patient able to stand from a sitting position without assistance. Gait is slightly wide based. Tandem gait not attempted. Romberg is negative. No drift is seen. Ambulates with single-point cane Reflexes: Deep tendon reflexes are symmetric and normal bilaterally.   DIAGNOSTIC DATA (LABS, IMAGING, TESTING)    ASSESSMENT AND PLAN 82 y.o. year old male has a past medical history of Diabetes; Hypertension; Depression; Dementia in Alzheimer's disease; Hyperlipidemia; Episodic VTach, pacer; Urinary incontinence; Anemia (06/2012); Elevated PSA; Microalbuminuria; BPH (benign prostatic hyperplasia); Hiatal hernia (02/26/2010); Gastritis (02/26/2010); Diverticulosis  (02/26/2010); Duodenitis (02/26/2010); Brady-tachy syndrome; Atrial fibrillation; CAD (coronary artery disease); and Fall (03/2014). here to follow up for his memory loss and restless legs syndrome.  Continue Aricept at 23 mg daily Namenda  10mg  BID  Continue to take Mirapex for restless legs Use cane at all times for safe ambulation at high risk for falls Follow-up in 6 months next with Jon Davis Dennie Bible, [facility], [facility], [facility] [address] [place], [place] [address] [phone]

## 2015-04-26 NOTE — Progress Notes (Signed)
I agree with the assessment and plan as directed by NP .The patient is known to me .   Ivin Rosenbloom, MD  

## 2015-04-26 NOTE — Patient Instructions (Signed)
Continue Aricept at 23 mg daily Namenda  10mg  BID  Continue to take Mirapex for restless legs Use cane at all times for safe ambulation at high risk for falls Follow-up in 6 months next with Dr. Brett Fairy

## 2015-05-08 DIAGNOSIS — M25511 Pain in right shoulder: Secondary | ICD-10-CM | POA: Diagnosis not present

## 2015-05-08 DIAGNOSIS — Z6829 Body mass index (BMI) 29.0-29.9, adult: Secondary | ICD-10-CM | POA: Diagnosis not present

## 2015-05-08 DIAGNOSIS — M19011 Primary osteoarthritis, right shoulder: Secondary | ICD-10-CM | POA: Diagnosis not present

## 2015-05-15 ENCOUNTER — Ambulatory Visit (INDEPENDENT_AMBULATORY_CARE_PROVIDER_SITE_OTHER): Payer: Medicare Other | Admitting: Cardiovascular Disease

## 2015-05-15 VITALS — BP 133/69 | HR 62 | Ht 72.0 in | Wt 210.0 lb

## 2015-05-15 DIAGNOSIS — I700 Atherosclerosis of aorta: Secondary | ICD-10-CM | POA: Diagnosis not present

## 2015-05-15 DIAGNOSIS — I10 Essential (primary) hypertension: Secondary | ICD-10-CM

## 2015-05-15 DIAGNOSIS — I481 Persistent atrial fibrillation: Secondary | ICD-10-CM

## 2015-05-15 DIAGNOSIS — E785 Hyperlipidemia, unspecified: Secondary | ICD-10-CM

## 2015-05-15 DIAGNOSIS — I2510 Atherosclerotic heart disease of native coronary artery without angina pectoris: Secondary | ICD-10-CM | POA: Diagnosis not present

## 2015-05-15 DIAGNOSIS — Z950 Presence of cardiac pacemaker: Secondary | ICD-10-CM

## 2015-05-15 DIAGNOSIS — I4729 Other ventricular tachycardia: Secondary | ICD-10-CM

## 2015-05-15 DIAGNOSIS — I4819 Other persistent atrial fibrillation: Secondary | ICD-10-CM

## 2015-05-15 DIAGNOSIS — I472 Ventricular tachycardia: Secondary | ICD-10-CM

## 2015-05-15 NOTE — Patient Instructions (Signed)
Jon Davis has been in persistent atrial fibrillation since last November and it seems to a permanent abnormality.   Dr Sallyanne Kuster recommends that you schedule a follow-up appointment in 3 months with a device check to make changes to device if need be.  If you need a refill on your cardiac medications before your next appointment, please call your pharmacy.

## 2015-05-15 NOTE — Progress Notes (Signed)
Patient ID: Jon Davis, male   DOB: 1926-02-20, 82 y.o.   MRN: [id number]    Cardiology Office Note    Date:  05/15/2015   ID:  Jon Davis, DOB [birth year], MRN [id number]  PCP:  Haywood Pao, MD  Cardiologist:   Sanda Klein, MD   Chief Complaint  Patient presents with  . Follow-up.  Pacemaker check    Pt has no complaints     History of Present Illness:  Jon Davis is a 82 y.o. male with a history of coronary artery disease, sinus node dysfunction with a dual-chamber permanent pacemaker, recurrent paroxysmal atrial fibrillation, severe atherosclerosis of the thoracic aorta, hypertension, hyperlipidemia, type 2 diabetes mellitus and progressive dementia.  Jon Davis and his wife moved to [place] last November. His memory has been steadily deteriorating despite the fact that he takes to cholinesterase inhibitors. It is actually very difficult to obtain review of systems from him, he is aware of the fact that his memory is poor.  As far as he can recall he has not had problems with palpitations. He denies syncope and falls. He denies any chest discomfort or problems with shortness of breath. He does have leg edema on exam, but he has not really paid attention to it. He has not had focal neurological deficits. He is followed by Dr. Brett Fairy in the neurology clinic.  Pacemaker interrogation today shows a normally functioning dual-chamber Medtronic EnRhythm device. He has been in persistent atrial fibrillation without pause since early November. Ventricular lead parameters are normal. Atrial lead parameters cannot be tested due to atrial fibrillation. There have been no new episodes of ventricular tachycardia. Ventricular rate control has been uniformly good. Battery voltage is 2.95 V (RRT 2.81 V). There is very low percentage of ventricular pacing. There is a notable reduction in activity level since November, from roughly 2 hours a day to one hour a day. He no  longer needs to work in the yard as he did before he moved into the assisted living facility.  He has had recurrent problems with iron deficiency anemia and for this reason is currently taking only aspirin for prophylaxis rather than warfarin or another anticoagulant. He has severe aortic atherosclerosis with ulcerated mobile plaque demonstrated in the descending thoracic aorta by previous TEE. He has well-controlled type 2 diabetes mellitus and hyperlipidemia on statin therapy. He received a dual-chamber permanent pacemaker in 2011 for symptomatic sinus bradycardia. Both before and after pacemaker implantation he has had occasional brief episodes of nonsustained ventricular tachycardia and atrial tachycardia, requiring beta blocker therapy.  Past Medical History  Diagnosis Date  . Diabetes ([place])   . Hypertension   . Depression   . Dementia in Alzheimer's disease   . Hyperlipidemia   . Episodic VTach, pacer     Followed by Dr. Avon Gully  . Urinary incontinence   . Anemia 06/2012  . Elevated PSA   . Microalbuminuria   . BPH (benign prostatic hyperplasia)     Followed by Dr. Matilde Sprang  . Hiatal hernia 02/26/2010  . Gastritis 02/26/2010    moderate  . Diverticulosis 02/26/2010    Sigmoid colon  . Duodenitis 02/26/2010  . Brady-tachy syndrome ([place])     04/17/2009 MDT pacer Enrhythmatr fib  . Atrial fibrillation ([place])   . CAD (coronary artery disease)   . Fall 03/2014    elbow fluid   . Memory loss     Past Surgical History  Procedure Laterality Date  . Appendectomy  1948  . Permanent pacemaker insertion  04/17/2009    MDT Enrhythm  . Cholecystectomy    . Cataracts    . Cardiac catheterization  07/07/2007    mild to mod. ca+ w/narrowing of 20% ostium of second diagonal,diffuse 80% stenosis small caliber infer. branch,20% mid LAD,60-70% narrowing mid atrioventricular groove CX,mild 20-30% RCA  . Carotid doppler  10/01/2006    0-49% right bulb,prox,ECA & prox ICA,0-49% left subclavian  bulb & prox ICA,right & left ICA abn. resistance  . Nm myoview ltd  10/01/2010    No ischemia    Current Medications: Outpatient Prescriptions Prior to Visit  Medication Sig Dispense Refill  . ALPRAZolam (XANAX) 0.25 MG tablet Take 0.25 mg by mouth 2 (two) times daily as needed for anxiety.    [name] amLODipine (NORVASC) 5 MG tablet Take 2.5 mg by mouth daily.     [name] aspirin 81 MG tablet Take 81 mg by mouth daily.    [name] atorvastatin (LIPITOR) 20 MG tablet Take 20 mg by mouth daily.    . benzonatate (TESSALON) 100 MG capsule Take 100 mg by mouth 2 (two) times daily. Take 2 capsules by mouth twice daily as needed for cough    . donepezil (ARICEPT) 23 MG TABS tablet Take 1 tablet (23 mg total) by mouth at bedtime. 30 tablet 0  . ferrous sulfate 325 (65 FE) MG tablet Take 325 mg by mouth 2 (two) times daily.    [name] glipiZIDE (GLUCOTROL) 10 MG tablet Take 10 mg by mouth daily.  5  . HYDROcodone-acetaminophen (NORCO/VICODIN) 5-325 MG per tablet Take 1 tablet by mouth 2 (two) times daily.    . isosorbide mononitrate (IMDUR) 30 MG 24 hr tablet Take 30 mg by mouth daily.    . memantine (NAMENDA) 10 MG tablet Take 10 mg by mouth 2 (two) times daily.    . metoprolol succinate (TOPROL-XL) 50 MG 24 hr tablet Take 50 mg by mouth daily. Take with or immediately following a meal.    . mupirocin cream (BACTROBAN) 2 % Apply 1 application topically 2 (two) times daily. Apply twice daily    . pantoprazole (PROTONIX) 40 MG tablet Take 40 mg by mouth daily.    . pramipexole (MIRAPEX) 0.125 MG tablet Take 2 tablets (0.25 mg total) by mouth every evening. Two hours before bedtime 90 tablet 3  . sertraline (ZOLOFT) 50 MG tablet Take 50 mg by mouth daily.     No facility-administered medications prior to visit.     Allergies:   Review of patient's allergies indicates no known allergies.   Social History   Social History  . Marital Status: Married    Spouse Name: [name]   . Number of Children: 2  . Years of  Education: 12   Occupational History  .    . retired    Social History Main Topics  . Smoking status: Former Research scientist (life sciences)  . Smokeless tobacco: Former Systems developer    Quit date: 02/09/1974  . Alcohol Use: No  . Drug Use: No  . Sexual Activity: No   Other Topics Concern  . Not on file   Social History Narrative   Patient lives at home with wife Jon Davis.    Patient has 2 children.    Patient has a high school education.    Patient retired.    Patient is right handed.      Family History:  The patient's family history includes Alzheimer's disease in his mother; Heart disease in his father.  ROS:   Please see the history of present illness.    ROS All other systems reviewed and are negative.   PHYSICAL EXAM:   VS:  BP 133/69 mmHg  Pulse 62  Ht 6' (1.829 m)  Wt 95.255 kg (210 lb)  BMI 28.47 kg/m2   GEN: Well nourished, well developed, in no acute distress HEENT: normal Neck: no JVD, carotid bruits, or masses Cardiac: Paradoxically split second heart sound, RRR; no murmurs, rubs, or gallops,no edema, the left subclavian pacemaker site  Respiratory:  clear to auscultation bilaterally, normal work of breathing GI: soft, nontender, nondistended, + BS MS: no deformity or atrophy Skin: warm and dry, no rash Neuro:  Alert and Oriented x 3, Strength and sensation are intact Psych: euthymic mood, full affect, or short-term memory is evident  Wt Readings from Last 3 Encounters:  05/15/15 95.255 kg (210 lb)  04/26/15 96.798 kg (213 lb 6.4 oz)  06/29/14 92.08 kg (203 lb)      Studies/Labs Reviewed:   EKG:  EKG is ordered today.  The ekg ordered today demonstrates background atrial fibrillation with 100% ventricular pacing  Recent Labs: No results found for requested labs within last 365 days.   Lipid Panel    Component Value Date/Time   CHOL  07/06/2007 0140    119        ATP III CLASSIFICATION:  <200     mg/dL   Desirable  200-239  mg/dL   Borderline High  >=240    mg/dL    High   TRIG 90 07/06/2007 0140   HDL 38* 07/06/2007 0140   CHOLHDL 3.1 07/06/2007 0140   VLDL 18 07/06/2007 0140   LDLCALC  07/06/2007 0140    63        Total Cholesterol/HDL:CHD Risk Coronary Heart Disease Risk Table                     Men   Women  1/2 Average Risk   3.4   3.3    Additional studies/ records that were reviewed today include:  Notes from neurology clinic  ASSESSMENT:    1. Persistent atrial fibrillation ([place])   2. Pacemaker   3. Coronary artery disease involving native coronary artery of native heart without angina pectoris   4. Aortic atherosclerosis ([place])   5. Essential hypertension   6. Nonsustained ventricular tachycardia ([place])   7. Hyperlipidemia    PLAN:  In order of problems listed above:  1. AFib: As far as I can tell this is asymptomatic. On the current medications he has well-controlled ventricular rates. He took anticoagulants in the past, but this was discontinued when he developed intractable iron deficiency anemia. Both his embolic risk and his bleeding risk or high. CHADSVasc 5 (age 52, HTN, CAD, DM). Overall, I consider him a poor candidate for long-term anticoagulation especially 11 takes into account his history of falls and advancing dementia. Since the arrhythmia is essentially asymptomatic, cardioversion and antiarrhythmic therapy is not justified. Continue aspirin for stroke prophylaxis. 2. Pacemaker: Normal device function. Need to ensure that he continues to do loads every 3 months now that he has moved to assisted living. We cannot rely on his own memory to perform the downloads. This is especially important since his particular pacemaker model is sometimes prone to unexpected battery depletion. He is not pacemaker dependent 3. CAD: Asymptomatic, focus on risk factor modification 4. Aortic atherosclerosis this was present throughout but most obvious in the descending thoracic  aorta, where there were large mobile structures. No obvious embolic  events. If he should need angiography in the future, prefer radial approach. 5. HTN: Good blood pressure control 6. NSVT: No significant events on current device check 7. HLP: Need to get updated labs from his primary care provider   Medication Adjustments/Labs and Tests Ordered: Current medicines are reviewed at length with the patient today.  Concerns regarding medicines are outlined above.  Medication changes, Labs and Tests ordered today are listed in the Patient Instructions below. Patient Instructions  Mr Trautner has been in persistent atrial fibrillation since last November and it seems to a permanent abnormality.   Dr Sallyanne Kuster recommends that you schedule a follow-up appointment in 3 months with a device check to make changes to device if need be.  If you need a refill on your cardiac medications before your next appointment, please call your pharmacy.    Mikael Spray, MD  05/15/2015 6:31 PM    [facility] [address], [place], [place]  [address] Phone: [phone]; Fax: [phone]

## 2015-05-16 ENCOUNTER — Encounter: Payer: Self-pay | Admitting: Cardiovascular Disease

## 2015-05-16 LAB — CUP PACEART INCLINIC DEVICE CHECK
Battery Voltage: 2.95 V
Brady Statistic AP VP Percent: 3.31 %
Brady Statistic AS VS Percent: 9.08 %
Brady Statistic RA Percent Paced: 48.8 %
Brady Statistic RV Percent Paced: 45.43 %
Implantable Lead Implant Date: 20110308
Implantable Lead Implant Date: 20110308
Implantable Lead Location: 753860
Implantable Lead Model: 5076
Lead Channel Setting Pacing Amplitude: 2.5 V
Lead Channel Setting Pacing Pulse Width: 0.4 ms
Lead Channel Setting Sensing Sensitivity: 0.9 mV
MDC IDC LEAD LOCATION: 753859
MDC IDC MSMT LEADCHNL RA IMPEDANCE VALUE: 432 Ohm
MDC IDC MSMT LEADCHNL RV IMPEDANCE VALUE: 432 Ohm
MDC IDC SESS DTM: 20170404163224
MDC IDC SET LEADCHNL RA PACING AMPLITUDE: 2 V
MDC IDC STAT BRADY AP VS PERCENT: 45.49 %
MDC IDC STAT BRADY AS VP PERCENT: 42.12 %

## 2015-05-21 ENCOUNTER — Encounter: Payer: Self-pay | Admitting: Cardiovascular Disease

## 2015-06-14 DIAGNOSIS — R2689 Other abnormalities of gait and mobility: Secondary | ICD-10-CM | POA: Diagnosis not present

## 2015-06-14 DIAGNOSIS — D649 Anemia, unspecified: Secondary | ICD-10-CM | POA: Diagnosis not present

## 2015-06-14 DIAGNOSIS — K449 Diaphragmatic hernia without obstruction or gangrene: Secondary | ICD-10-CM | POA: Diagnosis not present

## 2015-06-14 DIAGNOSIS — K222 Esophageal obstruction: Secondary | ICD-10-CM | POA: Diagnosis not present

## 2015-06-14 DIAGNOSIS — G319 Degenerative disease of nervous system, unspecified: Secondary | ICD-10-CM | POA: Diagnosis not present

## 2015-06-14 DIAGNOSIS — F919 Conduct disorder, unspecified: Secondary | ICD-10-CM | POA: Diagnosis not present

## 2015-06-14 DIAGNOSIS — G2581 Restless legs syndrome: Secondary | ICD-10-CM | POA: Diagnosis not present

## 2015-06-14 DIAGNOSIS — I209 Angina pectoris, unspecified: Secondary | ICD-10-CM | POA: Diagnosis not present

## 2015-06-14 DIAGNOSIS — E1151 Type 2 diabetes mellitus with diabetic peripheral angiopathy without gangrene: Secondary | ICD-10-CM | POA: Diagnosis not present

## 2015-07-03 DIAGNOSIS — L820 Inflamed seborrheic keratosis: Secondary | ICD-10-CM | POA: Diagnosis not present

## 2015-07-03 DIAGNOSIS — L309 Dermatitis, unspecified: Secondary | ICD-10-CM | POA: Diagnosis not present

## 2015-07-03 DIAGNOSIS — D485 Neoplasm of uncertain behavior of skin: Secondary | ICD-10-CM | POA: Diagnosis not present

## 2015-07-04 DIAGNOSIS — C44629 Squamous cell carcinoma of skin of left upper limb, including shoulder: Secondary | ICD-10-CM | POA: Diagnosis not present

## 2015-08-13 DIAGNOSIS — C44629 Squamous cell carcinoma of skin of left upper limb, including shoulder: Secondary | ICD-10-CM | POA: Diagnosis not present

## 2015-09-05 NOTE — Progress Notes (Signed)
Patient ID: Jon Davis, male   DOB: [birth year], 82 y.o.   MRN: [id number]    Cardiology Office Note    Date:  09/11/2015   ID:  Jon Davis, DOB 1926-03-10, MRN [id number]  PCP:  Haywood Pao, MD  Cardiologist:   Sanda Klein, MD   Chief Complaint  Patient presents with  . Follow-up.  Pacemaker check    Pt has no complaints     History of Present Illness:  Jon Davis is a 82 y.o. male with a history of coronary artery disease, sinus node dysfunction with a dual-chamber permanent pacemaker, recurrent paroxysmal atrial fibrillation, severe atherosclerosis of the thoracic aorta, hypertension, hyperlipidemia, type 2 diabetes mellitus and progressive dementia.  Mr. Zuk and his wife moved to [place] last November. His memory has been steadily deteriorating despite the fact that he takes 2 cholinesterase inhibitors. It is actually very difficult to obtain review of systems from him, he is aware of the fact that his memory is poor. His activity level has decreased in parallel going from 2 hours last November to 20 minutes a day now. This has been accompanied by a marked increase in ventricular pacing, although it is hard to establish a causal relationship he is also taking 2 different cholinesterase inhibitors that may be promoting pacing via prolonged AV conduction time  As far as he can recall he has not had problems with palpitations. He denies syncope and falls. He denies any chest discomfort or problems with shortness of breath. He does have leg edema on exam, but he has not really paid attention to it. He has not had focal neurological deficits. He is followed by Dr. Brett Fairy in the neurology clinic. He is accompanied today by a caregiver who confirms that he has not had falls. She states that he complains that he "never feels well".  Pacemaker interrogation today shows a normally functioning dual-chamber Medtronic EnRhythm device. He has been in persistent  atrial fibrillation without pause since early November. Ventricular lead parameters are normal. Atrial lead parameters cannot be tested due to atrial fibrillation. There have been no new episodes of ventricular tachycardia. Ventricular rate control has been uniformly good. Battery voltage is 2.95 V (RRT 2.81 V). There is markedly increased percentage of ventricular pacing, now 83%. There is a notable reduction in activity level since November, from roughly 2 hours a day to her half an hour a day.   He has had recurrent problems with iron deficiency anemia and for this reason is currently taking only aspirin for prophylaxis rather than warfarin or another anticoagulant. He has severe aortic atherosclerosis with ulcerated mobile plaque demonstrated in the descending thoracic aorta by previous TEE. He has well-controlled type 2 diabetes mellitus and hyperlipidemia on statin therapy. He received a dual-chamber permanent pacemaker in 2011 for symptomatic sinus bradycardia. Both before and after pacemaker implantation he has had occasional brief episodes of nonsustained ventricular tachycardia and atrial tachycardia, requiring beta blocker therapy.  Past Medical History:  Diagnosis Date  . Anemia 06/2012  . Atrial fibrillation ([place])   . BPH (benign prostatic hyperplasia)    Followed by Dr. Matilde Sprang  . Brady-tachy syndrome ([place])    04/17/2009 MDT pacer Enrhythmatr fib  . CAD (coronary artery disease)   . Dementia in Alzheimer's disease   . Depression   . Diabetes ([place])   . Diverticulosis 02/26/2010   Sigmoid colon  . Duodenitis 02/26/2010  . Elevated PSA   . Episodic VTach, pacer  Followed by Dr. Avon Gully  . Fall 03/2014   elbow fluid   . Gastritis 02/26/2010   moderate  . Hiatal hernia 02/26/2010  . Hyperlipidemia   . Hypertension   . Memory loss   . Microalbuminuria   . Urinary incontinence     Past Surgical History:  Procedure Laterality Date  . APPENDECTOMY  1948  . CARDIAC  CATHETERIZATION  07/07/2007   mild to mod. ca+ w/narrowing of 20% ostium of second diagonal,diffuse 80% stenosis small caliber infer. branch,20% mid LAD,60-70% narrowing mid atrioventricular groove CX,mild 20-30% RCA  . CAROTID DOPPLER  10/01/2006   0-49% right bulb,prox,ECA & prox ICA,0-49% left subclavian bulb & prox ICA,right & left ICA abn. resistance  . Cataracts    . CHOLECYSTECTOMY    . NM MYOVIEW LTD  10/01/2010   No ischemia  . PERMANENT PACEMAKER INSERTION  04/17/2009   MDT Enrhythm    Current Medications: Outpatient Medications Prior to Visit  Medication Sig Dispense Refill  . ALPRAZolam (XANAX) 0.25 MG tablet Take 0.25 mg by mouth 2 (two) times daily as needed for anxiety.    [name] amLODipine (NORVASC) 5 MG tablet Take 2.5 mg by mouth daily.     [name] aspirin 81 MG tablet Take 81 mg by mouth daily.    [name] atorvastatin (LIPITOR) 20 MG tablet Take 20 mg by mouth daily.    . benzonatate (TESSALON) 100 MG capsule Take 100 mg by mouth 2 (two) times daily. Take 2 capsules by mouth twice daily as needed for cough    . donepezil (ARICEPT) 23 MG TABS tablet Take 1 tablet (23 mg total) by mouth at bedtime. 30 tablet 0  . ferrous sulfate 325 (65 FE) MG tablet Take 325 mg by mouth 2 (two) times daily.    [name] glipiZIDE (GLUCOTROL) 10 MG tablet Take 10 mg by mouth daily.  5  . HYDROcodone-acetaminophen (NORCO/VICODIN) 5-325 MG per tablet Take 1 tablet by mouth 2 (two) times daily.    . isosorbide mononitrate (IMDUR) 30 MG 24 hr tablet Take 30 mg by mouth daily.    . memantine (NAMENDA) 10 MG tablet Take 10 mg by mouth 2 (two) times daily.    . mupirocin cream (BACTROBAN) 2 % Apply 1 application topically 2 (two) times daily. Apply twice daily    . pantoprazole (PROTONIX) 40 MG tablet Take 40 mg by mouth daily.    . pramipexole (MIRAPEX) 0.125 MG tablet Take 2 tablets (0.25 mg total) by mouth every evening. Two hours before bedtime 90 tablet 3  . sertraline (ZOLOFT) 50 MG tablet Take 50 mg by mouth daily.     . metoprolol succinate (TOPROL-XL) 50 MG 24 hr tablet Take 50 mg by mouth daily. Take with or immediately following a meal.     No facility-administered medications prior to visit.      Allergies:   Review of patient's allergies indicates no known allergies.   Social History   Social History  . Marital status: Married    Spouse name: [name]   . Number of children: 2  . Years of education: 12   Occupational History  .  Retired  . retired    Social History Main Topics  . Smoking status: Former Research scientist (life sciences)  . Smokeless tobacco: Former Systems developer    Quit date: 02/09/1974  . Alcohol use No  . Drug use: No  . Sexual activity: No   Other Topics Concern  . None   Social History Narrative   Patient lives  at home with wife [name].    Patient has 2 children.    Patient has a high school education.    Patient retired.    Patient is right handed.      Family History:  The patient's family history includes Alzheimer's disease in his mother; Heart disease in his father.   ROS:   Please see the history of present illness.    ROS All other systems reviewed and are negative.   PHYSICAL EXAM:   VS:  BP 120/68 (BP Location: Right Arm, Patient Position: Sitting, Cuff Size: Normal)   Pulse 63   Ht 6' (1.829 m)   Wt 93 kg (205 lb)   BMI 27.80 kg/m    GEN: Well nourished, well developed, in no acute distress  HEENT: normal  Neck: no JVD, carotid bruits, or masses Cardiac: Paradoxically split second heart sound, RRR; no murmurs, rubs, or gallops,no edema, the left subclavian pacemaker site  Respiratory:  clear to auscultation bilaterally, normal work of breathing GI: soft, nontender, nondistended, + BS MS: no deformity or atrophy  Skin: warm and dry, no rash Neuro:  Alert and Oriented x 3, Strength and sensation are intact Psych: euthymic mood, full affect, or short-term memory is evident  Wt Readings from Last 3 Encounters:  09/11/15 93 kg (205 lb)  05/15/15 95.3 kg (210 lb)    04/26/15 96.8 kg (213 lb 6.4 oz)      Studies/Labs Reviewed:   EKG:  EKG is ordered today.  The ekg ordered today demonstrates background atrial fibrillation with 100% ventricular pacing   ASSESSMENT:    1. Persistent atrial fibrillation ([place])   2. Coronary artery disease involving native coronary artery of native heart without angina pectoris   3. Aortic atherosclerosis ([place])   4. Essential hypertension   5. Nonsustained ventricular tachycardia ([place])   6. Hyperlipidemia   7. Pacemaker      PLAN:  In order of problems listed above:  1. AFib: As far as I can tell this is asymptomatic, But it has been temporally associated with decline in his activity level. He may have excessively well-controlled ventricular rates. This has led to high burden of ventricular pacing. We'll decrease the dose of metoprolol. He took anticoagulation in the past, but this was discontinued when he developed intractable iron deficiency anemia. Both his embolic risk and his bleeding risk are high. CHADSVasc 5 (age 59, HTN, CAD, DM). Overall, I consider him a poor candidate for long-term anticoagulation especially when one takes into account his history of falls and advancing dementia. Cardioversion and antiarrhythmic therapy is not justified or safe. Continue aspirin for stroke prophylaxis. 2. CAD: Asymptomatic, focus on risk factor modification. If he develops more angina on lower dose metoprolol, with use of other antianginal medications. 3. Aortic atherosclerosis this was present throughout but most obvious in the descending thoracic aorta, where there were large mobile structures. No obvious embolic events. If he should need angiography in the future, prefer radial approach. 4. HTN: good blood pressure control 5. NSVT: no significant events on current device check 6. HLP: target LDL<70. 7. Pacemaker: Normal device function. We'll do an in office pacemaker check in another 3 months. If he still has a high  burden of ventricular pacing will discontinue his beta blocker altogether.   Need to ensure that he continues to do downloads every 3 months now that he has moved to assisted living. We cannot rely on his own memory to perform the downloads. This is especially  important since his particular pacemaker model is sometimes prone to unexpected battery depletion. Fortunately , he is not pacemaker dependent   Medication Adjustments/Labs and Tests Ordered: Current medicines are reviewed at length with the patient today.  Concerns regarding medicines are outlined above.  Medication changes, Labs and Tests ordered today are listed in the Patient Instructions below. Patient Instructions  Dr Sallyanne Kuster has recommended making the following medication changes: 1. DECREASE Metoprolol to 25 mg daily  Dr C recommends that you schedule a follow-up appointment in 3 months with a device check.  If you need a refill on your cardiac medications before your next appointment, please call your pharmacy.    Signed, Sanda Klein, MD  09/11/2015 1:29 PM    [facility] [address], [place], [place]  [address] Phone: [phone]; Fax: [phone]

## 2015-09-11 ENCOUNTER — Encounter: Payer: Self-pay | Admitting: Cardiovascular Disease

## 2015-09-11 ENCOUNTER — Ambulatory Visit (INDEPENDENT_AMBULATORY_CARE_PROVIDER_SITE_OTHER): Payer: Medicare Other | Admitting: Cardiovascular Disease

## 2015-09-11 ENCOUNTER — Telehealth: Payer: Self-pay | Admitting: Cardiovascular Disease

## 2015-09-11 VITALS — BP 120/68 | HR 63 | Ht 72.0 in | Wt 205.0 lb

## 2015-09-11 DIAGNOSIS — I10 Essential (primary) hypertension: Secondary | ICD-10-CM

## 2015-09-11 DIAGNOSIS — I4819 Other persistent atrial fibrillation: Secondary | ICD-10-CM

## 2015-09-11 DIAGNOSIS — I2510 Atherosclerotic heart disease of native coronary artery without angina pectoris: Secondary | ICD-10-CM | POA: Diagnosis not present

## 2015-09-11 DIAGNOSIS — E785 Hyperlipidemia, unspecified: Secondary | ICD-10-CM

## 2015-09-11 DIAGNOSIS — I4729 Other ventricular tachycardia: Secondary | ICD-10-CM

## 2015-09-11 DIAGNOSIS — I481 Persistent atrial fibrillation: Secondary | ICD-10-CM | POA: Diagnosis not present

## 2015-09-11 DIAGNOSIS — Z950 Presence of cardiac pacemaker: Secondary | ICD-10-CM

## 2015-09-11 DIAGNOSIS — I472 Ventricular tachycardia: Secondary | ICD-10-CM

## 2015-09-11 DIAGNOSIS — I700 Atherosclerosis of aorta: Secondary | ICD-10-CM | POA: Diagnosis not present

## 2015-09-11 MED ORDER — METOPROLOL SUCCINATE ER 25 MG PO TB24: 25.0000 mg | ORAL_TABLET | Freq: Every day | ORAL | 11 refills | Status: DC

## 2015-09-11 NOTE — Telephone Encounter (Signed)
Returned call to [employer] from [facility] they fill patient medication box weekly and need order for change in medications made today to be faxed to them.  Also need Rx sent to [facility] on [address].    Note faxed to [phone]. Rx sent to pharmacy.

## 2015-09-11 NOTE — Patient Instructions (Signed)
Dr Sallyanne Kuster has recommended making the following medication changes: 1. DECREASE Metoprolol to 25 mg daily  Dr C recommends that you schedule a follow-up appointment in 3 months with a device check.  If you need a refill on your cardiac medications before your next appointment, please call your pharmacy.

## 2015-09-11 NOTE — Telephone Encounter (Signed)
New message   The nurse stated that their was a change in the pt metroprolol prescription and would like to get a copy of the written order. Please call her at [phone] to confirm and fax over of the order at [phone].    FYI   Pt prescriptions should be sent to:  [facility], [address] in [place]

## 2015-09-13 ENCOUNTER — Telehealth: Payer: Self-pay | Admitting: Cardiovascular Disease

## 2015-09-13 ENCOUNTER — Encounter: Payer: Self-pay | Admitting: Cardiovascular Disease

## 2015-09-13 MED ORDER — METOPROLOL SUCCINATE ER 25 MG PO TB24: 25.0000 mg | ORAL_TABLET | Freq: Every day | ORAL | 3 refills | Status: DC

## 2015-09-13 NOTE — Telephone Encounter (Signed)
Rx refill sent to new pharmacy.

## 2015-09-13 NOTE — Telephone Encounter (Signed)
New Message   *STAT* If patient is at the pharmacy, call can be transferred to refill team.   1. Which medications need to be refilled? (please list name of each medication and dose if known) Toprol-XL  2. Which pharmacy/location (including street and city if local pharmacy) is medication to be sent to? [facility], [address] of [address]  3. Do they need a 30 day or 90 day supply? Does not know  Medication is being sent to [facility] which is incorrect and it has been changed to [facility]

## 2015-10-17 ENCOUNTER — Observation Stay (HOSPITAL_COMMUNITY): Payer: Medicare Other

## 2015-10-17 ENCOUNTER — Encounter (HOSPITAL_COMMUNITY): Payer: Self-pay | Admitting: Emergency Medicine

## 2015-10-17 ENCOUNTER — Inpatient Hospital Stay (HOSPITAL_COMMUNITY)
Admission: EM | Admit: 2015-10-17 | Discharge: 2015-10-21 | DRG: 392 | Disposition: A | Discharge status: Home Health | Payer: Medicare Other | Attending: Internal Medicine | Admitting: Internal Medicine

## 2015-10-17 ENCOUNTER — Emergency Department (HOSPITAL_COMMUNITY): Payer: Medicare Other

## 2015-10-17 DIAGNOSIS — I4891 Unspecified atrial fibrillation: Secondary | ICD-10-CM | POA: Diagnosis present

## 2015-10-17 DIAGNOSIS — I6789 Other cerebrovascular disease: Secondary | ICD-10-CM | POA: Diagnosis not present

## 2015-10-17 DIAGNOSIS — F0280 Dementia in other diseases classified elsewhere without behavioral disturbance: Secondary | ICD-10-CM | POA: Diagnosis present

## 2015-10-17 DIAGNOSIS — N390 Urinary tract infection, site not specified: Secondary | ICD-10-CM | POA: Diagnosis not present

## 2015-10-17 DIAGNOSIS — Z7982 Long term (current) use of aspirin: Secondary | ICD-10-CM

## 2015-10-17 DIAGNOSIS — R197 Diarrhea, unspecified: Secondary | ICD-10-CM | POA: Diagnosis not present

## 2015-10-17 DIAGNOSIS — I10 Essential (primary) hypertension: Secondary | ICD-10-CM | POA: Diagnosis present

## 2015-10-17 DIAGNOSIS — R319 Hematuria, unspecified: Secondary | ICD-10-CM | POA: Diagnosis not present

## 2015-10-17 DIAGNOSIS — G309 Alzheimer's disease, unspecified: Secondary | ICD-10-CM | POA: Diagnosis not present

## 2015-10-17 DIAGNOSIS — Z9049 Acquired absence of other specified parts of digestive tract: Secondary | ICD-10-CM

## 2015-10-17 DIAGNOSIS — G9340 Encephalopathy, unspecified: Secondary | ICD-10-CM

## 2015-10-17 DIAGNOSIS — E1159 Type 2 diabetes mellitus with other circulatory complications: Secondary | ICD-10-CM | POA: Diagnosis present

## 2015-10-17 DIAGNOSIS — R1031 Right lower quadrant pain: Secondary | ICD-10-CM | POA: Diagnosis not present

## 2015-10-17 DIAGNOSIS — I480 Paroxysmal atrial fibrillation: Secondary | ICD-10-CM | POA: Diagnosis present

## 2015-10-17 DIAGNOSIS — Z66 Do not resuscitate: Secondary | ICD-10-CM | POA: Diagnosis present

## 2015-10-17 DIAGNOSIS — N179 Acute kidney failure, unspecified: Secondary | ICD-10-CM

## 2015-10-17 DIAGNOSIS — B9620 Unspecified Escherichia coli [E. coli] as the cause of diseases classified elsewhere: Secondary | ICD-10-CM | POA: Diagnosis present

## 2015-10-17 DIAGNOSIS — I517 Cardiomegaly: Secondary | ICD-10-CM | POA: Diagnosis not present

## 2015-10-17 DIAGNOSIS — F4489 Other dissociative and conversion disorders: Secondary | ICD-10-CM | POA: Diagnosis not present

## 2015-10-17 DIAGNOSIS — K5792 Diverticulitis of intestine, part unspecified, without perforation or abscess without bleeding: Secondary | ICD-10-CM | POA: Diagnosis present

## 2015-10-17 DIAGNOSIS — Z79899 Other long term (current) drug therapy: Secondary | ICD-10-CM

## 2015-10-17 DIAGNOSIS — E785 Hyperlipidemia, unspecified: Secondary | ICD-10-CM | POA: Diagnosis present

## 2015-10-17 DIAGNOSIS — D72829 Elevated white blood cell count, unspecified: Secondary | ICD-10-CM | POA: Diagnosis not present

## 2015-10-17 DIAGNOSIS — R339 Retention of urine, unspecified: Secondary | ICD-10-CM

## 2015-10-17 DIAGNOSIS — R109 Unspecified abdominal pain: Secondary | ICD-10-CM

## 2015-10-17 DIAGNOSIS — K5732 Diverticulitis of large intestine without perforation or abscess without bleeding: Secondary | ICD-10-CM | POA: Diagnosis not present

## 2015-10-17 DIAGNOSIS — I2510 Atherosclerotic heart disease of native coronary artery without angina pectoris: Secondary | ICD-10-CM | POA: Diagnosis present

## 2015-10-17 DIAGNOSIS — E119 Type 2 diabetes mellitus without complications: Secondary | ICD-10-CM | POA: Diagnosis present

## 2015-10-17 DIAGNOSIS — F0390 Unspecified dementia without behavioral disturbance: Secondary | ICD-10-CM | POA: Diagnosis present

## 2015-10-17 DIAGNOSIS — Z87891 Personal history of nicotine dependence: Secondary | ICD-10-CM

## 2015-10-17 DIAGNOSIS — Z950 Presence of cardiac pacemaker: Secondary | ICD-10-CM

## 2015-10-17 DIAGNOSIS — Z8249 Family history of ischemic heart disease and other diseases of the circulatory system: Secondary | ICD-10-CM

## 2015-10-17 DIAGNOSIS — Z820 Family history of epilepsy and other diseases of the nervous system: Secondary | ICD-10-CM

## 2015-10-17 LAB — COMPREHENSIVE METABOLIC PANEL
ALBUMIN: 3.8 g/dL (ref 3.5–5.0)
ALK PHOS: 67 U/L (ref 38–126)
ALT: 11 U/L — ABNORMAL LOW (ref 17–63)
ANION GAP: 9 (ref 5–15)
AST: 18 U/L (ref 15–41)
BILIRUBIN TOTAL: 0.9 mg/dL (ref 0.3–1.2)
BUN: 29 mg/dL — ABNORMAL HIGH (ref 6–20)
CALCIUM: 8.6 mg/dL — AB (ref 8.9–10.3)
CO2: 24 mmol/L (ref 22–32)
Chloride: 109 mmol/L (ref 101–111)
Creatinine, Ser: 1.78 mg/dL — ABNORMAL HIGH (ref 0.61–1.24)
GFR calc Af Amer: 38 mL/min — ABNORMAL LOW (ref >60)
GFR, EST NON AFRICAN AMERICAN: 33 mL/min — AB (ref >60)
GLUCOSE: 169 mg/dL — AB (ref 65–99)
Potassium: 4 mmol/L (ref 3.5–5.1)
Sodium: 142 mmol/L (ref 135–145)
TOTAL PROTEIN: 7.1 g/dL (ref 6.5–8.1)

## 2015-10-17 LAB — CREATININE, SERUM
CREATININE: 1.66 mg/dL — AB (ref 0.61–1.24)
GFR, EST AFRICAN AMERICAN: 41 mL/min — AB (ref >60)
GFR, EST NON AFRICAN AMERICAN: 35 mL/min — AB (ref >60)

## 2015-10-17 LAB — URINE MICROSCOPIC-ADD ON

## 2015-10-17 LAB — URINALYSIS, ROUTINE W REFLEX MICROSCOPIC
BILIRUBIN URINE: NEGATIVE
Glucose, UA: NEGATIVE mg/dL
KETONES UR: NEGATIVE mg/dL
NITRITE: POSITIVE — AB
Protein, ur: 30 mg/dL — AB
Specific Gravity, Urine: 1.016 (ref 1.005–1.030)
pH: 5.5 (ref 5.0–8.0)

## 2015-10-17 LAB — CBC WITH DIFFERENTIAL/PLATELET
BASOS PCT: 0 %
Basophils Absolute: 0 10*3/uL (ref 0.0–0.1)
EOS ABS: 0 10*3/uL (ref 0.0–0.7)
Eosinophils Relative: 0 %
HCT: 40.8 % (ref 39.0–52.0)
HEMOGLOBIN: 14.1 g/dL (ref 13.0–17.0)
Lymphocytes Relative: 6 %
Lymphs Abs: 1.3 10*3/uL (ref 0.7–4.0)
MCH: 31.1 pg (ref 26.0–34.0)
MCHC: 34.6 g/dL (ref 30.0–36.0)
MCV: 90.1 fL (ref 78.0–100.0)
MONO ABS: 2.4 10*3/uL — AB (ref 0.1–1.0)
MONOS PCT: 11 %
NEUTROS PCT: 83 %
Neutro Abs: 17.5 10*3/uL — ABNORMAL HIGH (ref 1.7–7.7)
Platelets: 206 10*3/uL (ref 150–400)
RBC: 4.53 MIL/uL (ref 4.22–5.81)
RDW: 13.5 % (ref 11.5–15.5)
WBC: 21.2 10*3/uL — ABNORMAL HIGH (ref 4.0–10.5)

## 2015-10-17 LAB — CBC
HEMATOCRIT: 40.3 % (ref 39.0–52.0)
HEMOGLOBIN: 13.4 g/dL (ref 13.0–17.0)
MCH: 30.7 pg (ref 26.0–34.0)
MCHC: 33.3 g/dL (ref 30.0–36.0)
MCV: 92.2 fL (ref 78.0–100.0)
Platelets: 193 10*3/uL (ref 150–400)
RBC: 4.37 MIL/uL (ref 4.22–5.81)
RDW: 13.7 % (ref 11.5–15.5)
WBC: 17.6 10*3/uL — AB (ref 4.0–10.5)

## 2015-10-17 LAB — I-STAT CG4 LACTIC ACID, ED: LACTIC ACID, VENOUS: 2.3 mmol/L — AB (ref 0.5–1.9)

## 2015-10-17 LAB — LIPASE, BLOOD: Lipase: 22 U/L (ref 11–51)

## 2015-10-17 LAB — PROTIME-INR
INR: 1.2
PROTHROMBIN TIME: 15.3 s — AB (ref 11.4–15.2)

## 2015-10-17 LAB — GLUCOSE, CAPILLARY: Glucose-Capillary: 174 mg/dL — ABNORMAL HIGH (ref 65–99)

## 2015-10-17 LAB — LACTIC ACID, PLASMA: LACTIC ACID, VENOUS: 2 mmol/L — AB (ref 0.5–1.9)

## 2015-10-17 MED ORDER — METRONIDAZOLE IN NACL 5-0.79 MG/ML-% IV SOLN
500.0000 mg | Freq: Once | INTRAVENOUS | Status: AC
  Administered 2015-10-17: 500 mg via INTRAVENOUS

## 2015-10-17 MED ORDER — ONDANSETRON HCL 4 MG/2ML IJ SOLN: 4.0000 mg | Freq: Four times a day (QID) | INTRAMUSCULAR | Status: DC | PRN

## 2015-10-17 MED ORDER — SERTRALINE HCL 50 MG PO TABS
50.0000 mg | ORAL_TABLET | Freq: Every day | ORAL | Status: DC
  Administered 2015-10-18 – 2015-10-21 (×4): 50 mg via ORAL
  Filled 2015-10-17 (×4): qty 1

## 2015-10-17 MED ORDER — ISOSORBIDE MONONITRATE ER 30 MG PO TB24
30.0000 mg | ORAL_TABLET | Freq: Every day | ORAL | Status: DC
  Administered 2015-10-18 – 2015-10-21 (×4): 30 mg via ORAL
  Filled 2015-10-17 (×4): qty 1

## 2015-10-17 MED ORDER — ACETAMINOPHEN 325 MG PO TABS: 650.0000 mg | ORAL_TABLET | Freq: Four times a day (QID) | ORAL | Status: DC | PRN

## 2015-10-17 MED ORDER — DONEPEZIL HCL 23 MG PO TABS
23.0000 mg | ORAL_TABLET | Freq: Every day | ORAL | Status: DC
  Administered 2015-10-17 – 2015-10-20 (×4): 23 mg via ORAL
  Filled 2015-10-17 (×5): qty 1

## 2015-10-17 MED ORDER — METOPROLOL SUCCINATE ER 25 MG PO TB24
25.0000 mg | ORAL_TABLET | Freq: Every day | ORAL | Status: DC
  Administered 2015-10-18 – 2015-10-21 (×4): 25 mg via ORAL
  Filled 2015-10-17 (×4): qty 1

## 2015-10-17 MED ORDER — FERROUS SULFATE 325 (65 FE) MG PO TABS
325.0000 mg | ORAL_TABLET | Freq: Two times a day (BID) | ORAL | Status: DC
  Administered 2015-10-17 – 2015-10-21 (×8): 325 mg via ORAL
  Filled 2015-10-17 (×8): qty 1

## 2015-10-17 MED ORDER — PRAMIPEXOLE DIHYDROCHLORIDE 0.25 MG PO TABS
0.2500 mg | ORAL_TABLET | Freq: Every day | ORAL | Status: DC
  Administered 2015-10-17 – 2015-10-20 (×4): 0.25 mg via ORAL
  Filled 2015-10-17 (×6): qty 1

## 2015-10-17 MED ORDER — CIPROFLOXACIN IN D5W 400 MG/200ML IV SOLN
400.0000 mg | Freq: Once | INTRAVENOUS | Status: AC
  Administered 2015-10-17: 400 mg via INTRAVENOUS
  Filled 2015-10-17: qty 200

## 2015-10-17 MED ORDER — ENOXAPARIN SODIUM 40 MG/0.4ML ~~LOC~~ SOLN
40.0000 mg | Freq: Every day | SUBCUTANEOUS | Status: DC
  Administered 2015-10-17 – 2015-10-20 (×4): 40 mg via SUBCUTANEOUS
  Filled 2015-10-17 (×4): qty 0.4

## 2015-10-17 MED ORDER — GLIPIZIDE 10 MG PO TABS: 10.0000 mg | ORAL_TABLET | Freq: Every day | ORAL | Status: DC

## 2015-10-17 MED ORDER — MEMANTINE HCL 10 MG PO TABS
10.0000 mg | ORAL_TABLET | Freq: Two times a day (BID) | ORAL | Status: DC
  Administered 2015-10-17 – 2015-10-21 (×8): 10 mg via ORAL
  Filled 2015-10-17 (×9): qty 1

## 2015-10-17 MED ORDER — ONDANSETRON HCL 4 MG PO TABS: 4.0000 mg | ORAL_TABLET | Freq: Four times a day (QID) | ORAL | Status: DC | PRN

## 2015-10-17 MED ORDER — METRONIDAZOLE IN NACL 5-0.79 MG/ML-% IV SOLN
500.0000 mg | Freq: Three times a day (TID) | INTRAVENOUS | Status: DC
  Administered 2015-10-18 – 2015-10-21 (×11): 500 mg via INTRAVENOUS
  Filled 2015-10-17 (×13): qty 100

## 2015-10-17 MED ORDER — INSULIN ASPART 100 UNIT/ML ~~LOC~~ SOLN
0.0000 [IU] | Freq: Three times a day (TID) | SUBCUTANEOUS | Status: DC
  Administered 2015-10-18 (×3): 1 [IU] via SUBCUTANEOUS
  Administered 2015-10-19: 2 [IU] via SUBCUTANEOUS
  Administered 2015-10-19 – 2015-10-21 (×4): 1 [IU] via SUBCUTANEOUS

## 2015-10-17 MED ORDER — SODIUM CHLORIDE 0.9 % IV SOLN
INTRAVENOUS | Status: AC
  Administered 2015-10-17 – 2015-10-18 (×3): via INTRAVENOUS

## 2015-10-17 MED ORDER — ALPRAZOLAM 0.25 MG PO TABS
0.2500 mg | ORAL_TABLET | Freq: Two times a day (BID) | ORAL | Status: DC
  Administered 2015-10-17 – 2015-10-21 (×8): 0.25 mg via ORAL
  Filled 2015-10-17 (×8): qty 1

## 2015-10-17 MED ORDER — METRONIDAZOLE IN NACL 5-0.79 MG/ML-% IV SOLN: 500.0000 mg | Freq: Three times a day (TID) | INTRAVENOUS | Status: DC

## 2015-10-17 MED ORDER — ACETAMINOPHEN 650 MG RE SUPP: 650.0000 mg | Freq: Four times a day (QID) | RECTAL | Status: DC | PRN

## 2015-10-17 MED ORDER — IOPAMIDOL (ISOVUE-300) INJECTION 61%
100.0000 mL | Freq: Once | INTRAVENOUS | Status: AC | PRN
  Administered 2015-10-17: 80 mL via INTRAVENOUS

## 2015-10-17 MED ORDER — ASPIRIN 81 MG PO CHEW
81.0000 mg | CHEWABLE_TABLET | Freq: Every day | ORAL | Status: DC
  Administered 2015-10-17 – 2015-10-21 (×5): 81 mg via ORAL
  Filled 2015-10-17 (×5): qty 1

## 2015-10-17 MED ORDER — SODIUM CHLORIDE 0.9 % IV BOLUS (SEPSIS)
1000.0000 mL | Freq: Once | INTRAVENOUS | Status: AC
  Administered 2015-10-17: 1000 mL via INTRAVENOUS

## 2015-10-17 MED ORDER — DEXTROSE 5 % IV SOLN
2.0000 g | INTRAVENOUS | Status: DC
  Administered 2015-10-18 – 2015-10-21 (×4): 2 g via INTRAVENOUS
  Filled 2015-10-17 (×4): qty 2

## 2015-10-17 MED ORDER — AMLODIPINE BESYLATE 2.5 MG PO TABS
2.5000 mg | ORAL_TABLET | Freq: Every day | ORAL | Status: DC
  Administered 2015-10-17 – 2015-10-21 (×5): 2.5 mg via ORAL
  Filled 2015-10-17 (×5): qty 1

## 2015-10-17 MED ORDER — PANTOPRAZOLE SODIUM 40 MG PO TBEC
40.0000 mg | DELAYED_RELEASE_TABLET | Freq: Every day | ORAL | Status: DC
  Administered 2015-10-18 – 2015-10-21 (×4): 40 mg via ORAL
  Filled 2015-10-17 (×4): qty 1

## 2015-10-17 MED ORDER — SODIUM CHLORIDE 0.9 % IV SOLN
Freq: Once | INTRAVENOUS | Status: AC
  Administered 2015-10-17: 20:00:00 via INTRAVENOUS

## 2015-10-17 MED ORDER — ATORVASTATIN CALCIUM 20 MG PO TABS
20.0000 mg | ORAL_TABLET | Freq: Every evening | ORAL | Status: DC
  Administered 2015-10-17 – 2015-10-20 (×4): 20 mg via ORAL
  Filled 2015-10-17 (×4): qty 1

## 2015-10-17 MED ORDER — HYDROCODONE-ACETAMINOPHEN 5-325 MG PO TABS
1.0000 | ORAL_TABLET | Freq: Two times a day (BID) | ORAL | Status: DC
Start: 2015-10-17 — End: 2015-10-21
  Administered 2015-10-17 – 2015-10-21 (×8): 1 via ORAL
  Filled 2015-10-17 (×8): qty 1

## 2015-10-17 NOTE — ED Notes (Signed)
PA made aware of patient lactic result. 

## 2015-10-17 NOTE — ED Provider Notes (Signed)
[facility] DEPT Provider Note   CSN: [id number] Arrival date & time: 10/17/15  1800     History   Chief Complaint Chief Complaint  Patient presents with  . Diarrhea    HPI   Level V Caveat due to severe dementia  Jon Davis is an 82 y.o. with history of afib, s/p pacemaker placement, CAD, severe dementia, chronic anemia who presents to the ED for evaluation of diarrhea. Patient provides no history. He knows his name but responds "no" to all questions. Per EMS, pt lives at [place] with his wife and has apparently had diarrhea and poor PO intake for the past two days. Pt is unaccompanied in the ED. Only anticoagulation is ASA due to his chronic anemia.  After initial conversation I was able to speak with pt's wife via telephone. She states that at baseline Jon Davis is confused and disoriented, though a talkative and conversational person. States for the past two days he has been very quiet and nonverbal which is unusual for him. Has not been acting particularly confused. He has been complaining of abdominal pain at home and has apparently had numerous episodes of loose watery stools. He has had poor appetite and PO intake; today only had a couple sips of ginger ale. Pt's wife states she also thinks his abdomen is more distended than usual. She is unsure if he has had a fever. He has not had any vomiting. He has not complained to her of nausea or chest pain.   Past Medical History:  Diagnosis Date  . Anemia 06/2012  . Atrial fibrillation ([place])   . BPH (benign prostatic hyperplasia)    Followed by Dr. Matilde Sprang  . Brady-tachy syndrome ([place])    04/17/2009 MDT pacer Enrhythmatr fib  . CAD (coronary artery disease)   . Dementia in Alzheimer's disease   . Depression   . Diabetes ([place])   . Diverticulosis 02/26/2010   Sigmoid colon  . Duodenitis 02/26/2010  . Elevated PSA   . Episodic VTach, pacer    Followed by Dr. Avon Gully  . Fall 03/2014   elbow fluid   .  Gastritis 02/26/2010   moderate  . Hiatal hernia 02/26/2010  . Hyperlipidemia   . Hypertension   . Memory loss   . Microalbuminuria   . Urinary incontinence     Patient Active Problem List   Diagnosis Date Noted  . Cerebral degeneration, unspecified 05/26/2013  . Restless legs syndrome (RLS) 05/26/2013  . CAD (coronary artery disease) 07/31/2012  . Pacemaker 07/31/2012  . Nonsustained ventricular tachycardia ([place]) 07/31/2012  . Aortic atherosclerosis ([place]) 07/31/2012  . Microcytic anemia 06/29/2012  . Long term (current) use of anticoagulants 04/26/2012  . THYROID NODULE 01/23/2010  . DIABETES MELLITUS, TYPE II 01/23/2010  . Hyperlipidemia 01/23/2010  . ANEMIA, IRON DEFICIENCY 01/23/2010  . Memory loss 01/23/2010  . Essential hypertension 01/23/2010  . Atrial fibrillation ([place]) 01/23/2010  . CVA 01/23/2010  . ESOPHAGEAL STRICTURE 01/23/2010  . HIATAL HERNIA 01/23/2010  . DIVERTICULOSIS, COLON 01/23/2010  . HEMOCCULT POSITIVE STOOL 01/23/2010    Past Surgical History:  Procedure Laterality Date  . APPENDECTOMY  1948  . CARDIAC CATHETERIZATION  07/07/2007   mild to mod. ca+ w/narrowing of 20% ostium of second diagonal,diffuse 80% stenosis small caliber infer. branch,20% mid LAD,60-70% narrowing mid atrioventricular groove CX,mild 20-30% RCA  . CAROTID DOPPLER  10/01/2006   0-49% right bulb,prox,ECA & prox ICA,0-49% left subclavian bulb & prox ICA,right & left ICA abn. resistance  .  Cataracts    . CHOLECYSTECTOMY    . NM MYOVIEW LTD  10/01/2010   No ischemia  . PERMANENT PACEMAKER INSERTION  04/17/2009   MDT Enrhythm       Home Medications    Prior to Admission medications   Medication Sig Start Date End Date Taking? Authorizing Provider  ALPRAZolam (XANAX) 0.25 MG tablet Take 0.25 mg by mouth 2 (two) times daily as needed for anxiety.    Historical Provider, MD  amLODipine (NORVASC) 5 MG tablet Take 2.5 mg by mouth daily.     Historical Provider, MD  aspirin 81 MG  tablet Take 81 mg by mouth daily.    Historical Provider, MD  atorvastatin (LIPITOR) 20 MG tablet Take 20 mg by mouth daily.    Historical Provider, MD  benzonatate (TESSALON) 100 MG capsule Take 100 mg by mouth 2 (two) times daily. Take 2 capsules by mouth twice daily as needed for cough    Historical Provider, MD  donepezil (ARICEPT) 23 MG TABS tablet Take 1 tablet (23 mg total) by mouth at bedtime. 02/21/15   Dennie Bible, NP  ferrous sulfate 325 (65 FE) MG tablet Take 325 mg by mouth 2 (two) times daily.    Historical Provider, MD  glipiZIDE (GLUCOTROL) 10 MG tablet Take 10 mg by mouth daily. 04/21/14   Historical Provider, MD  HYDROcodone-acetaminophen (NORCO/VICODIN) 5-325 MG per tablet Take 1 tablet by mouth 2 (two) times daily.    Historical Provider, MD  isosorbide mononitrate (IMDUR) 30 MG 24 hr tablet Take 30 mg by mouth daily.    Historical Provider, MD  memantine (NAMENDA) 10 MG tablet Take 10 mg by mouth 2 (two) times daily.    Historical Provider, MD  metoprolol succinate (TOPROL-XL) 25 MG 24 hr tablet Take 1 tablet (25 mg total) by mouth daily. Take with or immediately following a meal. 09/13/15   Mihai Croitoru, MD  mupirocin cream (BACTROBAN) 2 % Apply 1 application topically 2 (two) times daily. Apply twice daily    Historical Provider, MD  pantoprazole (PROTONIX) 40 MG tablet Take 40 mg by mouth daily.    Historical Provider, MD  pramipexole (MIRAPEX) 0.125 MG tablet Take 2 tablets (0.25 mg total) by mouth every evening. Two hours before bedtime 06/29/14   Dennie Bible, NP  sertraline (ZOLOFT) 50 MG tablet Take 50 mg by mouth daily.    Historical Provider, MD    Family History Family History  Problem Relation Age of Onset  . Alzheimer's disease Mother     Deceased  . Heart disease Father     Deceased    Social History Social History  Substance Use Topics  . Smoking status: Former Research scientist (life sciences)  . Smokeless tobacco: Former Systems developer    Quit date: 02/09/1974  . Alcohol  use No     Allergies   Review of patient's allergies indicates no known allergies.   Review of Systems Review of Systems  Unable to perform ROS: Dementia     Physical Exam Updated Vital Signs BP 145/68 (BP Location: Right Arm)   Pulse 64   Temp 98.3 F (36.8 C) (Oral)   Resp (!) 33   SpO2 94%   Physical Exam  Constitutional:  Elderly, appears uncomfortable  HENT:  Right Ear: External ear normal.  Left Ear: External ear normal.  Nose: Nose normal.  Mouth/Throat: Oropharynx is clear and moist. No oropharyngeal exudate.  Eyes: Conjunctivae are normal.  Neck: Neck supple.  Cardiovascular: Normal rate, regular rhythm,  normal heart sounds and intact distal pulses.   Pulmonary/Chest: Effort normal and breath sounds normal. No respiratory distress. He has no wheezes.  Intermittent tachypnea  Abdominal: Soft. Bowel sounds are normal. He exhibits distension. There is tenderness. There is no rebound and no guarding.  Abdomen mildly distended but soft Diffuse lower abdominal tenderness particularly suprapubic and periumbilical  Musculoskeletal: He exhibits no edema.  Lymphadenopathy:    He has no cervical adenopathy.  Neurological: He is alert. No cranial nerve deficit.  Oriented to person only (baseline) Will respond to commands Moves all extremities freely  Skin: Skin is warm and dry.  Psychiatric: He has a normal mood and affect.  Nursing note and vitals reviewed.  Vitals:   10/17/15 1816 10/17/15 1818 10/17/15 1848 10/17/15 2111  BP: 145/68 145/68  144/64  Pulse: 65 64  62  Resp: (!) 35 (!) 33  17  Temp:  98.3 F (36.8 C) 98.7 F (37.1 C)   TempSrc:  Oral Rectal   SpO2: 95% 94%  94%     ED Treatments / Results  Labs (all labs ordered are listed, but only abnormal results are displayed) Labs Reviewed  COMPREHENSIVE METABOLIC PANEL - Abnormal; Notable for the following:       Result Value   Glucose, Bld 169 (*)    BUN 29 (*)    Creatinine, Ser 1.78 (*)     Calcium 8.6 (*)    ALT 11 (*)    GFR calc non Af Amer 33 (*)    GFR calc Af Amer 38 (*)    All other components within normal limits  CBC WITH DIFFERENTIAL/PLATELET - Abnormal; Notable for the following:    WBC 21.2 (*)    Neutro Abs 17.5 (*)    Monocytes Absolute 2.4 (*)    All other components within normal limits  URINALYSIS, ROUTINE W REFLEX MICROSCOPIC (NOT AT [facility]) - Abnormal; Notable for the following:    APPearance CLOUDY (*)    Hgb urine dipstick LARGE (*)    Protein, ur 30 (*)    Nitrite POSITIVE (*)    Leukocytes, UA MODERATE (*)    All other components within normal limits  URINE MICROSCOPIC-ADD ON - Abnormal; Notable for the following:    Squamous Epithelial / LPF 0-5 (*)    Bacteria, UA MANY (*)    Casts GRANULAR CAST (*)    All other components within normal limits  I-STAT CG4 LACTIC ACID, ED - Abnormal; Notable for the following:    Lactic Acid, Venous 2.30 (*)    All other components within normal limits  URINE CULTURE  LIPASE, BLOOD    EKG  EKG Interpretation None       Radiology Ct Abdomen Pelvis W Contrast  Result Date: 10/17/2015 CLINICAL DATA:  Diarrhea and right lower quadrant pain for 2 days EXAM: CT ABDOMEN AND PELVIS WITH CONTRAST TECHNIQUE: Multidetector CT imaging of the abdomen and pelvis was performed using the standard protocol following bolus administration of intravenous contrast. CONTRAST:  36mL ISOVUE-300 IOPAMIDOL (ISOVUE-300) INJECTION 61% COMPARISON:  None. FINDINGS: Lower chest: No focal infiltrate or sizable effusion is noted. Multiple calcified hilar lymph nodes are seen. Coronary calcifications are noted. Hepatobiliary: The gallbladder has been surgically removed. The liver is within normal limits. Pancreas: No mass, inflammatory changes, or other significant abnormality. Spleen: Multiple calcified granulomas are seen. Adrenals/Urinary Tract: The adrenal glands are within normal limits. Kidneys demonstrate normal enhancement bilaterally  with bilateral excretion. No renal calculi or obstructive  changes are noted. The bladder is decompressed. Stomach/Bowel: The appendix has been surgically removed. Scattered diverticular changes are noted particularly in the sigmoid colon. Some very early inflammatory changes are noted along the sigmoid colon which may represent early diverticulitis. No perforation or abscess formation is noted. Vascular/Lymphatic: Aortoiliac calcifications are noted without aneurysmal dilatation. No significant lymphadenopathy is noted. Reproductive: Prostate is mildly enlarged indenting upon the bladder inferiorly. Other: None. Musculoskeletal: Degenerative changes of the lumbar spine are noted. IMPRESSION: Changes suggestive of early diverticulitis in the sigmoid colon. Prostatic enlargement. Postsurgical changes without acute abnormality. Electronically Signed   By: Inez Catalina M.D.   On: 10/17/2015 20:41    Procedures Procedures (including critical care time)  Medications Ordered in ED Medications  ciprofloxacin (CIPRO) IVPB 400 mg (400 mg Intravenous New Bag/Given 10/17/15 2111)  metroNIDAZOLE (FLAGYL) IVPB 500 mg (not administered)  sodium chloride 0.9 % bolus 1,000 mL (0 mLs Intravenous Stopped 10/17/15 2109)  0.9 %  sodium chloride infusion ( Intravenous New Bag/Given 10/17/15 1954)  iopamidol (ISOVUE-300) 61 % injection 100 mL (80 mLs Intravenous Contrast Given 10/17/15 2021)     Initial Impression / Assessment and Plan / ED Course  I have reviewed the triage vital signs and the nursing notes.  Pertinent labs & imaging results that were available during my care of the patient were reviewed by me and considered in my medical decision making (see chart for details).  Clinical Course    9:15 PM Pt is an 82 y.o. male with severe dementia presenting from his retirement home for evaluation of diarrhea and abdominal pain for two days. History obtained from his wife via telephone. Pt found to have a white count of  21,200, lactic acid 2.3, Cr 1.78 BUN 29. Lower abdominal tenderness. Nitrite positive UTI as well as early sigmoid divertculitis. Pressures stable. Tachypnea has resolved. Afebrile. Pt given 1L NS bolus followed by continuous infusion. Ordered IV cipro/flagyl in the ED. I spoke with Dr. Hal Hope of the hospitalist service who will admit pt to tele obs. Appreciate assistance.   Final Clinical Impressions(s) / ED Diagnoses   Final diagnoses:  Diverticulitis of large intestine without perforation or abscess without bleeding  Urinary tract infection with hematuria, site unspecified  AKI (acute kidney injury) ([place])  Diarrhea, unspecified type    New Prescriptions New Prescriptions   No medications on file     Carmelina Dane 10/17/15 2117    Davonna Belling, MD 10/18/15 [phone]

## 2015-10-17 NOTE — Progress Notes (Signed)
Pharmacy Antibiotic Note  Jon Davis is a 82 y.o. male admitted on 10/17/2015 with UTI and early sigmoid diverticulitis.  Pharmacy has been consulted for ciprofloxacin dosing; Flagyl per MD. After discussion with MD, will change to Rocephin d/t age, borderline renal function, and better E. Coli coverage with Rocephin.  Plan:  Rocephin 2g IV q24 hr (intra-abdominal indication)  Flagyl 500 mg IV q8 hr per MD - appropriate  No renal adjustment required; pharmacy will sign off at this time    Temp (24hrs), Avg:98.5 F (36.9 C), Min:98.3 F (36.8 C), Max:98.7 F (37.1 C)   Recent Labs Lab 10/17/15 1852 10/17/15 1904  WBC 21.2*  --   CREATININE 1.78*  --   LATICACIDVEN  --  2.30*    CrCl cannot be calculated (Unknown ideal weight.).    Allergies  Allergen Reactions  . Penicillins Rash    Has patient had a PCN reaction causing immediate rash, facial/tongue/throat swelling, SOB or lightheadedness with hypotension: No  Has patient had a PCN reaction causing severe rash involving mucus membranes or skin necrosis: unknown Has patient had a PCN reaction that required hospitalization: No  Has patient had a PCN reaction occurring within the last 10 years: No  If all of the above answers are "NO", then may proceed with Cephalosporin use.     Thank you for allowing pharmacy to be a part of this patient's care.  Reuel Boom, PharmD, BCPS Pager: [phone] 10/17/2015, 10:11 PM

## 2015-10-17 NOTE — ED Notes (Signed)
Bed: [id number] Expected date:  Expected time:  Means of arrival:  Comments: EMS- 82yo M, diarrhea x 2 days/Hx of dementia

## 2015-10-17 NOTE — H&P (Signed)
History and Physical    Jon Davis [id number] DOB: April 17, 1926 DOA: 10/17/2015  PCP: Haywood Pao, MD  Patient coming from: Nursing home.  Chief Complaint: Diarrhea and abdominal discomfort.  History obtained from patient's wife through the phone.  HPI: Jon Davis is a 82 y.o. male with paroxysmal atrial fibrillation, hypertension, CAD, CVA dementia was brought to the ER after patient has been having poor oral intake with persistent diarrhea over the last 2 days with occasional vomiting. As per patient's wife with whom I spoke through the phone, patient's wife stated that patient was complaining of lower abdominal discomfort. CT of the abdomen and pelvis shows sigmoid diverticulitis with lactic acid mildly elevated. Patient was given fluids and started on antibiotics. In addition labs also show UTI, acute renal failure and leukocytosis. Chest x-ray shows possible condition versus infiltrate. Patient has severe dementia and does not contribute to history. As per patient's wife patient did not have any chest pain shortness of breath or productive cough.   ED Course: Fluid bolus and antibiotics were started.  Review of Systems: As per HPI, rest all negative.   Past Medical History:  Diagnosis Date  . Anemia 06/2012  . Atrial fibrillation ([place])   . BPH (benign prostatic hyperplasia)    Followed by Dr. Matilde Sprang  . Brady-tachy syndrome ([place])    04/17/2009 MDT pacer Enrhythmatr fib  . CAD (coronary artery disease)   . Dementia in Alzheimer's disease   . Depression   . Diabetes ([place])   . Diverticulosis 02/26/2010   Sigmoid colon  . Duodenitis 02/26/2010  . Elevated PSA   . Episodic VTach, pacer    Followed by Dr. Avon Gully  . Fall 03/2014   elbow fluid   . Gastritis 02/26/2010   moderate  . Hiatal hernia 02/26/2010  . Hyperlipidemia   . Hypertension   . Memory loss   . Microalbuminuria   . Urinary incontinence     Past Surgical History:  Procedure  Laterality Date  . APPENDECTOMY  1948  . CARDIAC CATHETERIZATION  07/07/2007   mild to mod. ca+ w/narrowing of 20% ostium of second diagonal,diffuse 80% stenosis small caliber infer. branch,20% mid LAD,60-70% narrowing mid atrioventricular groove CX,mild 20-30% RCA  . CAROTID DOPPLER  10/01/2006   0-49% right bulb,prox,ECA & prox ICA,0-49% left subclavian bulb & prox ICA,right & left ICA abn. resistance  . Cataracts    . CHOLECYSTECTOMY    . NM MYOVIEW LTD  10/01/2010   No ischemia  . PERMANENT PACEMAKER INSERTION  04/17/2009   MDT Enrhythm     reports that he has quit smoking. He quit smokeless tobacco use about 41 years ago. He reports that he does not drink alcohol or use drugs.  Allergies  Allergen Reactions  . Penicillins Other (See Comments)    Has patient had a PCN reaction causing immediate rash, facial/tongue/throat swelling, SOB or lightheadedness with hypotension: unknown Has patient had a PCN reaction causing severe rash involving mucus membranes or skin necrosis: unknown Has patient had a PCN reaction that required hospitalization: unknown Has patient had a PCN reaction occurring within the last 10 years: unknown If all of the above answers are "NO", then may proceed with Cephalosporin use. Unknown reaction     Family History  Problem Relation Age of Onset  . Alzheimer's disease Mother     Deceased  . Heart disease Father     Deceased    Prior to Admission medications   Medication Sig Start  Date End Date Taking? Authorizing Provider  ALPRAZolam (XANAX) 0.25 MG tablet Take 0.25 mg by mouth 2 (two) times daily.    Yes Historical Provider, MD  amLODipine (NORVASC) 5 MG tablet Take 2.5 mg by mouth daily.    Yes Historical Provider, MD  aspirin 81 MG tablet Take 81 mg by mouth daily.   Yes Historical Provider, MD  atorvastatin (LIPITOR) 20 MG tablet Take 20 mg by mouth every evening.    Yes Historical Provider, MD  donepezil (ARICEPT) 23 MG TABS tablet Take 1 tablet (23  mg total) by mouth at bedtime. 02/21/15  Yes Dennie Bible, NP  ferrous sulfate 325 (65 FE) MG tablet Take 325 mg by mouth 2 (two) times daily.   Yes Historical Provider, MD  glipiZIDE (GLUCOTROL) 10 MG tablet Take 10 mg by mouth daily. 04/21/14  Yes Historical Provider, MD  HYDROcodone-acetaminophen (NORCO/VICODIN) 5-325 MG per tablet Take 1 tablet by mouth 2 (two) times daily.   Yes Historical Provider, MD  isosorbide mononitrate (IMDUR) 30 MG 24 hr tablet Take 30 mg by mouth daily.   Yes Historical Provider, MD  memantine (NAMENDA) 10 MG tablet Take 10 mg by mouth 2 (two) times daily.   Yes Historical Provider, MD  metoprolol succinate (TOPROL-XL) 25 MG 24 hr tablet Take 1 tablet (25 mg total) by mouth daily. Take with or immediately following a meal. 09/13/15  Yes Mihai Croitoru, MD  pantoprazole (PROTONIX) 40 MG tablet Take 40 mg by mouth daily.   Yes Historical Provider, MD  pramipexole (MIRAPEX) 0.125 MG tablet Take 2 tablets (0.25 mg total) by mouth every evening. Two hours before bedtime 06/29/14  Yes Dennie Bible, NP  sertraline (ZOLOFT) 50 MG tablet Take 50 mg by mouth daily.   Yes Historical Provider, MD    Physical Exam: Vitals:   10/17/15 1816 10/17/15 1818 10/17/15 1848 10/17/15 2111  BP: 145/68 145/68  144/64  Pulse: 65 64  62  Resp: (!) 35 (!) 33  17  Temp:  98.3 F (36.8 C) 98.7 F (37.1 C)   TempSrc:  Oral Rectal   SpO2: 95% 94%  94%      Constitutional: Not in distress. Vitals:   10/17/15 1816 10/17/15 1818 10/17/15 1848 10/17/15 2111  BP: 145/68 145/68  144/64  Pulse: 65 64  62  Resp: (!) 35 (!) 33  17  Temp:  98.3 F (36.8 C) 98.7 F (37.1 C)   TempSrc:  Oral Rectal   SpO2: 95% 94%  94%   Eyes: Anicteric no pallor. ENMT: No discharge from the ears eyes nose and mouth. Neck: No mass felt. No neck rigidity. Respiratory: No rhonchi or crepitations. Cardiovascular: S1 and S2 heard. Abdomen: Soft nontender bowel sounds present. Musculoskeletal:  No edema. Skin: No rash. Neurologic: Alert awake oriented to his name. Moves all extremities. Psychiatric: Patient has dementia.   Labs on Admission: I have personally reviewed following labs and imaging studies  CBC:  Recent Labs Lab 10/17/15 1852  WBC 21.2*  NEUTROABS 17.5*  HGB 14.1  HCT 40.8  MCV 90.1  PLT 99991111   Basic Metabolic Panel:  Recent Labs Lab 10/17/15 1852  NA 142  K 4.0  CL 109  CO2 24  GLUCOSE 169*  BUN 29*  CREATININE 1.78*  CALCIUM 8.6*   GFR: CrCl cannot be calculated (Unknown ideal weight.). Liver Function Tests:  Recent Labs Lab 10/17/15 1852  AST 18  ALT 11*  ALKPHOS 67  BILITOT 0.9  PROT  7.1  ALBUMIN 3.8    Recent Labs Lab 10/17/15 1852  LIPASE 22   No results for input(s): AMMONIA in the last 168 hours. Coagulation Profile: No results for input(s): INR, PROTIME in the last 168 hours. Cardiac Enzymes: No results for input(s): CKTOTAL, CKMB, CKMBINDEX, TROPONINI in the last 168 hours. BNP (last 3 results) No results for input(s): PROBNP in the last 8760 hours. HbA1C: No results for input(s): HGBA1C in the last 72 hours. CBG: No results for input(s): GLUCAP in the last 168 hours. Lipid Profile: No results for input(s): CHOL, HDL, LDLCALC, TRIG, CHOLHDL, LDLDIRECT in the last 72 hours. Thyroid Function Tests: No results for input(s): TSH, T4TOTAL, FREET4, T3FREE, THYROIDAB in the last 72 hours. Anemia Panel: No results for input(s): VITAMINB12, FOLATE, FERRITIN, TIBC, IRON, RETICCTPCT in the last 72 hours. Urine analysis:    Component Value Date/Time   COLORURINE YELLOW 10/17/2015 1819   APPEARANCEUR CLOUDY (A) 10/17/2015 1819   LABSPEC 1.016 10/17/2015 1819   PHURINE 5.5 10/17/2015 1819   GLUCOSEU NEGATIVE 10/17/2015 1819   HGBUR LARGE (A) 10/17/2015 1819   BILIRUBINUR NEGATIVE 10/17/2015 1819   BILIRUBINUR small 08/19/2012 1241   KETONESUR NEGATIVE 10/17/2015 1819   PROTEINUR 30 (A) 10/17/2015 1819   UROBILINOGEN  0.2 08/19/2012 1241   UROBILINOGEN 0.2 03/20/2009 0645   NITRITE POSITIVE (A) 10/17/2015 1819   LEUKOCYTESUR MODERATE (A) 10/17/2015 1819   Sepsis Labs: @LABRCNTIP (procalcitonin:4,lacticidven:4) )No results found for this or any previous visit (from the past 240 hour(s)).   Radiological Exams on Admission: Ct Abdomen Pelvis W Contrast  Result Date: 10/17/2015 CLINICAL DATA:  Diarrhea and right lower quadrant pain for 2 days EXAM: CT ABDOMEN AND PELVIS WITH CONTRAST TECHNIQUE: Multidetector CT imaging of the abdomen and pelvis was performed using the standard protocol following bolus administration of intravenous contrast. CONTRAST:  39mL ISOVUE-300 IOPAMIDOL (ISOVUE-300) INJECTION 61% COMPARISON:  None. FINDINGS: Lower chest: No focal infiltrate or sizable effusion is noted. Multiple calcified hilar lymph nodes are seen. Coronary calcifications are noted. Hepatobiliary: The gallbladder has been surgically removed. The liver is within normal limits. Pancreas: No mass, inflammatory changes, or other significant abnormality. Spleen: Multiple calcified granulomas are seen. Adrenals/Urinary Tract: The adrenal glands are within normal limits. Kidneys demonstrate normal enhancement bilaterally with bilateral excretion. No renal calculi or obstructive changes are noted. The bladder is decompressed. Stomach/Bowel: The appendix has been surgically removed. Scattered diverticular changes are noted particularly in the sigmoid colon. Some very early inflammatory changes are noted along the sigmoid colon which may represent early diverticulitis. No perforation or abscess formation is noted. Vascular/Lymphatic: Aortoiliac calcifications are noted without aneurysmal dilatation. No significant lymphadenopathy is noted. Reproductive: Prostate is mildly enlarged indenting upon the bladder inferiorly. Other: None. Musculoskeletal: Degenerative changes of the lumbar spine are noted. IMPRESSION: Changes suggestive of early  diverticulitis in the sigmoid colon. Prostatic enlargement. Postsurgical changes without acute abnormality. Electronically Signed   By: Inez Catalina M.D.   On: 10/17/2015 20:41     Assessment/Plan Principal Problem:   Sigmoid diverticulitis Active Problems:   Hyperlipidemia   Essential hypertension   Atrial fibrillation ([facility])   Diverticulitis   Dementia   ARF (acute renal failure) (H[facility])   Diarrhea    1. Sigmoid diverticulitis and diarrhea - will check stool studies. Patient is placed on ceftriaxone and Flagyl. If lactate does not improved with hydration then may have to consider ischemic cause. 2. Acute renal failure probably from poor oral intake and diarrhea - hydrate and closely  follow metabolic panel. 3. UTI - follow urine cultures. Patient is on ceftriaxone. 4. Possible aspiration from vomiting - patient is on empiric antibiotics for now. Patient is not hypoxic. Chest exam appears benign. 5. Hypertension - continue amlodipine and metoprolol and Imdur. 6. CAD - continue aspirin statins and metoprolol. 7. History of paroxysmal atrial fibrillation - was taken off anticoagulation secondary to anemia. Patient presently on metoprolol and aspirin. Chads 2 vasc score is more than 2. 8. Diabetes mellitus type 2 - continue patient's oral intake is reliable and will keep patient only on sliding scale coverage and hold Glucotrol. 9. Advanced dementia on Namenda and Aricept.   DVT prophylaxis: Lovenox. Code Status: DO NOT RESUSCITATE.  Family Communication: [name].  Disposition Plan: Nursing home.  Consults called: None.  Admission status: Observation.    Rise Patience MD [facility] Pager [phone].  If 7PM-7AM, please contact night-coverage [email or web address] Password [facility]  10/17/2015, 9:34 PM

## 2015-10-17 NOTE — ED Notes (Signed)
Hospitalist at bedside 

## 2015-10-17 NOTE — ED Triage Notes (Signed)
Per EMS, pt from [place], wife reports pt has had diarrhea x2 days. Wife states pt has had decreased PO intake. Pt reports abdominal pain and tender to RLQ. Hx dementia and afib.

## 2015-10-17 NOTE — ED Notes (Signed)
Patient transported to CT 

## 2015-10-18 ENCOUNTER — Encounter (HOSPITAL_COMMUNITY): Payer: Self-pay | Admitting: *Deleted

## 2015-10-18 DIAGNOSIS — Z820 Family history of epilepsy and other diseases of the nervous system: Secondary | ICD-10-CM | POA: Diagnosis not present

## 2015-10-18 DIAGNOSIS — I481 Persistent atrial fibrillation: Secondary | ICD-10-CM | POA: Diagnosis not present

## 2015-10-18 DIAGNOSIS — E119 Type 2 diabetes mellitus without complications: Secondary | ICD-10-CM | POA: Diagnosis present

## 2015-10-18 DIAGNOSIS — G9340 Encephalopathy, unspecified: Secondary | ICD-10-CM | POA: Diagnosis not present

## 2015-10-18 DIAGNOSIS — Z9049 Acquired absence of other specified parts of digestive tract: Secondary | ICD-10-CM | POA: Diagnosis not present

## 2015-10-18 DIAGNOSIS — I10 Essential (primary) hypertension: Secondary | ICD-10-CM | POA: Diagnosis not present

## 2015-10-18 DIAGNOSIS — E785 Hyperlipidemia, unspecified: Secondary | ICD-10-CM

## 2015-10-18 DIAGNOSIS — K5732 Diverticulitis of large intestine without perforation or abscess without bleeding: Principal | ICD-10-CM

## 2015-10-18 DIAGNOSIS — F0280 Dementia in other diseases classified elsewhere without behavioral disturbance: Secondary | ICD-10-CM | POA: Diagnosis present

## 2015-10-18 DIAGNOSIS — Z87891 Personal history of nicotine dependence: Secondary | ICD-10-CM | POA: Diagnosis not present

## 2015-10-18 DIAGNOSIS — Z950 Presence of cardiac pacemaker: Secondary | ICD-10-CM | POA: Diagnosis not present

## 2015-10-18 DIAGNOSIS — Z8249 Family history of ischemic heart disease and other diseases of the circulatory system: Secondary | ICD-10-CM | POA: Diagnosis not present

## 2015-10-18 DIAGNOSIS — I2510 Atherosclerotic heart disease of native coronary artery without angina pectoris: Secondary | ICD-10-CM | POA: Diagnosis present

## 2015-10-18 DIAGNOSIS — D72829 Elevated white blood cell count, unspecified: Secondary | ICD-10-CM | POA: Diagnosis not present

## 2015-10-18 DIAGNOSIS — B9620 Unspecified Escherichia coli [E. coli] as the cause of diseases classified elsewhere: Secondary | ICD-10-CM | POA: Diagnosis present

## 2015-10-18 DIAGNOSIS — K5730 Diverticulosis of large intestine without perforation or abscess without bleeding: Secondary | ICD-10-CM | POA: Diagnosis not present

## 2015-10-18 DIAGNOSIS — N179 Acute kidney failure, unspecified: Secondary | ICD-10-CM | POA: Diagnosis not present

## 2015-10-18 DIAGNOSIS — N390 Urinary tract infection, site not specified: Secondary | ICD-10-CM | POA: Diagnosis present

## 2015-10-18 DIAGNOSIS — F0390 Unspecified dementia without behavioral disturbance: Secondary | ICD-10-CM | POA: Diagnosis not present

## 2015-10-18 DIAGNOSIS — Z7982 Long term (current) use of aspirin: Secondary | ICD-10-CM | POA: Diagnosis not present

## 2015-10-18 DIAGNOSIS — I480 Paroxysmal atrial fibrillation: Secondary | ICD-10-CM | POA: Diagnosis present

## 2015-10-18 DIAGNOSIS — G309 Alzheimer's disease, unspecified: Secondary | ICD-10-CM | POA: Diagnosis present

## 2015-10-18 DIAGNOSIS — Z79899 Other long term (current) drug therapy: Secondary | ICD-10-CM | POA: Diagnosis not present

## 2015-10-18 DIAGNOSIS — Z66 Do not resuscitate: Secondary | ICD-10-CM | POA: Diagnosis present

## 2015-10-18 LAB — BASIC METABOLIC PANEL
Anion gap: 8 (ref 5–15)
BUN: 27 mg/dL — AB (ref 6–20)
CHLORIDE: 109 mmol/L (ref 101–111)
CO2: 26 mmol/L (ref 22–32)
CREATININE: 1.6 mg/dL — AB (ref 0.61–1.24)
Calcium: 8.3 mg/dL — ABNORMAL LOW (ref 8.9–10.3)
GFR, EST AFRICAN AMERICAN: 43 mL/min — AB (ref >60)
GFR, EST NON AFRICAN AMERICAN: 37 mL/min — AB (ref >60)
Glucose, Bld: 135 mg/dL — ABNORMAL HIGH (ref 65–99)
POTASSIUM: 3.4 mmol/L — AB (ref 3.5–5.1)
Sodium: 143 mmol/L (ref 135–145)

## 2015-10-18 LAB — HEPATIC FUNCTION PANEL
ALBUMIN: 3.6 g/dL (ref 3.5–5.0)
ALK PHOS: 64 U/L (ref 38–126)
ALT: 12 U/L — ABNORMAL LOW (ref 17–63)
AST: 18 U/L (ref 15–41)
BILIRUBIN DIRECT: 0.2 mg/dL (ref 0.1–0.5)
BILIRUBIN INDIRECT: 0.6 mg/dL (ref 0.3–0.9)
TOTAL PROTEIN: 7.1 g/dL (ref 6.5–8.1)
Total Bilirubin: 0.8 mg/dL (ref 0.3–1.2)

## 2015-10-18 LAB — GLUCOSE, CAPILLARY
GLUCOSE-CAPILLARY: 137 mg/dL — AB (ref 65–99)
GLUCOSE-CAPILLARY: 125 mg/dL — AB (ref 65–99)
Glucose-Capillary: 136 mg/dL — ABNORMAL HIGH (ref 65–99)
Glucose-Capillary: 145 mg/dL — ABNORMAL HIGH (ref 65–99)
Glucose-Capillary: 125 mg/dL — ABNORMAL HIGH (ref 65–99)

## 2015-10-18 LAB — CBC
HCT: 38.9 % — ABNORMAL LOW (ref 39.0–52.0)
Hemoglobin: 12.9 g/dL — ABNORMAL LOW (ref 13.0–17.0)
MCH: 29.9 pg (ref 26.0–34.0)
MCHC: 33.2 g/dL (ref 30.0–36.0)
MCV: 90.3 fL (ref 78.0–100.0)
PLATELETS: 183 10*3/uL (ref 150–400)
RBC: 4.31 MIL/uL (ref 4.22–5.81)
RDW: 13.5 % (ref 11.5–15.5)
WBC: 14.5 10*3/uL — AB (ref 4.0–10.5)

## 2015-10-18 LAB — LACTIC ACID, PLASMA: Lactic Acid, Venous: 1.3 mmol/L (ref 0.5–1.9)

## 2015-10-18 LAB — C DIFFICILE QUICK SCREEN W PCR REFLEX
C DIFFICILE (CDIFF) INTERP: NOT DETECTED
C DIFFICLE (CDIFF) ANTIGEN: NEGATIVE
C Diff toxin: NEGATIVE

## 2015-10-18 LAB — MRSA PCR SCREENING: MRSA by PCR: POSITIVE — AB

## 2015-10-18 MED ORDER — POTASSIUM CHLORIDE CRYS ER 20 MEQ PO TBCR
40.0000 meq | EXTENDED_RELEASE_TABLET | ORAL | Status: AC
  Administered 2015-10-18 (×2): 40 meq via ORAL
  Filled 2015-10-18 (×2): qty 2

## 2015-10-18 NOTE — Progress Notes (Signed)
Upon returning from lunch pt was no longer in his chair where I had left him and he was asleep in his bed. After coming out of another patients room to reassess him I found his catheter on the bed and his IV line cut I then preceded to find him in the bathroom on the toilet. After returning him to his chair we restarted his IV fluids with new lines and put a new condom cath on the pt. Chair alarm was turned on.

## 2015-10-18 NOTE — Progress Notes (Signed)
Pts chair alarms went off went to the room to find the patient standing alone with his catheter tube disconnected from his condom cath tubing. Grabbed the patient and assisted him to the bathroom where he started shaking uncontrollably, called the nurse into the room and we proceeded to take his CBG and vitals which are listed under his flow sheets. Pt was returned to bed with bed alarm on.

## 2015-10-18 NOTE — Discharge Planning (Signed)
CRITICAL VALUE ALERT  Critical value received:  MRSA Positive  Date of notification:  10/18/15   Time of notification:  [id number]  Critical value read back:Yes.    Nurse who received alert: Erling Conte, RN    MD notified (1st page): Baltazar Najjar  Time of first page:  0242  MD notified (2nd page): N/A  Time of second page: N/A  Responding MD:  N/A  Time MD responded:  N/A

## 2015-10-18 NOTE — Care Management Obs Status (Signed)
[place] NOTIFICATION   Patient Details  Name: Jon Davis MRN: [id number] Date of Birth: 06/23/1926   Medicare Observation Status Notification Given:  Yes    MahabirJuliann Pulse, RN 10/18/2015, 1:58 PM

## 2015-10-18 NOTE — Progress Notes (Addendum)
PROGRESS NOTE    Jon Davis  [id number] DOB: 21-Jan-1927 DOA: 10/17/2015  PCP: Haywood Pao, MD   Brief Narrative:  Jon Davis is a 82 y.o. male with paroxysmal atrial fibrillation, hypertension, CAD, CVA, severe dementia was brought to the ER after patient has been having poor oral intake with persistent diarrhea over the last 2 days with occasional vomiting. CT of the abdomen and pelvis revealed sigmoid diverticulitis. He had a mildly elevated lactic acid, a UA that was positive for UTI, gross cytosis and acute renal failure.  Subjective: No complaints.  Assessment & Plan:   Principal Problem:  Sigmoid diverticulitis -continues to be quite tender in left lower abdomen and suprapubic area -Continue Rocephin and Flagyl -Liquid diet- -Apparently he had some diarrhea at home but has not had any in the hospital- GI panel negative for pathogens  Active Problems: Acute renal insufficiency -Mildly increase in creatinine from baseline of 1.2 in 2014 to 1.7- now improved to 1.35 after IV fluids and therefore likely pre-renal  UTI - U culture growing e coli- follow sensitivities   Hyperlipidemia -Continue statin  Essential hypertension - Norvasc, Toprol, Imdur   Paroxysmal Atrial fibrillation  -Continue Toprol -On baby aspirin for secondary stroke prevention-poor candidate for anticoagulation per cardiology  Pacemaker for V. tach    Dementia -Continue Aricept and Namenda   DVT prophylaxis: Lovenox Code Status: DO NOT RESUSCITATE Family Communication:  Disposition Plan: Home when stable Consultants:   None Procedures:   None Antimicrobials:  Anti-infectives    Start     Dose/Rate Route Frequency Ordered Stop   10/18/15 0800  cefTRIAXone (ROCEPHIN) 2 g in dextrose 5 % 50 mL IVPB     2 g 100 mL/hr over 30 Minutes Intravenous Every 24 hours 10/17/15 2215     10/18/15 0600  metroNIDAZOLE (FLAGYL) IVPB 500 mg     500 mg 100 mL/hr over 60 Minutes  Intravenous Every 8 hours 10/17/15 2215     10/17/15 2145  metroNIDAZOLE (FLAGYL) IVPB 500 mg  Status:  Discontinued     500 mg 100 mL/hr over 60 Minutes Intravenous Every 8 hours 10/17/15 2134 10/17/15 2215   10/17/15 2100  ciprofloxacin (CIPRO) IVPB 400 mg     400 mg 200 mL/hr over 60 Minutes Intravenous  Once 10/17/15 2051 10/17/15 2211   10/17/15 2100  metroNIDAZOLE (FLAGYL) IVPB 500 mg     500 mg 100 mL/hr over 60 Minutes Intravenous  Once 10/17/15 2051 10/18/15 0056       Objective: Vitals:   10/18/15 0500 10/18/15 0825 10/18/15 0900 10/18/15 1212  BP: (!) 158/55 (!) 157/73 (!) 151/61 (!) 120/50  Pulse: 62 74 64 (!) 58  Resp: 18 17  18   Temp: 98.1 F (36.7 C) 97.8 F (36.6 C)  97.6 F (36.4 C)  TempSrc: Oral Oral  Oral  SpO2: 94% 96%  95%  Weight:      Height:        Intake/Output Summary (Last 24 hours) at 10/18/15 1308 Last data filed at 10/18/15 0947  Gross per 24 hour  Intake            937.5 ml  Output             1053 ml  Net           -115.5 ml   Filed Weights   10/17/15 2240  Weight: 91.9 kg (202 lb 9.6 oz)    Examination: General exam: Appears comfortable  HEENT: PERRLA, oral mucosa moist, no sclera icterus or thrush Respiratory system: Clear to auscultation. Respiratory effort normal. Cardiovascular system: S1 & S2 heard, RRR.  No murmurs  Gastrointestinal system: Abdomen soft, Tender in left lower quadrant and suprapubic area, nondistended. Normal bowel sound. No organomegaly Central nervous system: Alert and oriented. No focal neurological deficits. Extremities: No cyanosis, clubbing or edema Skin: No rashes or ulcers Psychiatry:  Mood & affect appropriate.    Data Reviewed: I have personally reviewed following labs and imaging studies  CBC:  Recent Labs Lab 10/17/15 1852 10/17/15 2312 10/18/15 0536  WBC 21.2* 17.6* 14.5*  NEUTROABS 17.5*  --   --   HGB 14.1 13.4 12.9*  HCT 40.8 40.3 38.9*  MCV 90.1 92.2 90.3  PLT 206 193 [id number]    Basic Metabolic Panel:  Recent Labs Lab 10/17/15 1852 10/17/15 2312 10/18/15 0536  NA 142  --  143  K 4.0  --  3.4*  CL 109  --  109  CO2 24  --  26  GLUCOSE 169*  --  135*  BUN 29*  --  27*  CREATININE 1.78* 1.66* 1.60*  CALCIUM 8.6*  --  8.3*   GFR: Estimated Creatinine Clearance: 35.7 mL/min (by C-G formula based on SCr of 1.6 mg/dL). Liver Function Tests:  Recent Labs Lab 10/17/15 1852 10/18/15 0536  AST 18 18  ALT 11* 12*  ALKPHOS 67 64  BILITOT 0.9 0.8  PROT 7.1 7.1  ALBUMIN 3.8 3.6    Recent Labs Lab 10/17/15 1852  LIPASE 22   No results for input(s): AMMONIA in the last 168 hours. Coagulation Profile:  Recent Labs Lab 10/17/15 2123  INR 1.20   Cardiac Enzymes: No results for input(s): CKTOTAL, CKMB, CKMBINDEX, TROPONINI in the last 168 hours. BNP (last 3 results) No results for input(s): PROBNP in the last 8760 hours. HbA1C: No results for input(s): HGBA1C in the last 72 hours. CBG:  Recent Labs Lab 10/17/15 2309 10/18/15 0745 10/18/15 1203  GLUCAP 174* 136* 145*   Lipid Profile: No results for input(s): CHOL, HDL, LDLCALC, TRIG, CHOLHDL, LDLDIRECT in the last 72 hours. Thyroid Function Tests: No results for input(s): TSH, T4TOTAL, FREET4, T3FREE, THYROIDAB in the last 72 hours. Anemia Panel: No results for input(s): VITAMINB12, FOLATE, FERRITIN, TIBC, IRON, RETICCTPCT in the last 72 hours. Urine analysis:    Component Value Date/Time   COLORURINE YELLOW 10/17/2015 1819   APPEARANCEUR CLOUDY (A) 10/17/2015 1819   LABSPEC 1.016 10/17/2015 1819   PHURINE 5.5 10/17/2015 1819   GLUCOSEU NEGATIVE 10/17/2015 1819   HGBUR LARGE (A) 10/17/2015 1819   BILIRUBINUR NEGATIVE 10/17/2015 1819   BILIRUBINUR small 08/19/2012 1241   KETONESUR NEGATIVE 10/17/2015 1819   PROTEINUR 30 (A) 10/17/2015 1819   UROBILINOGEN 0.2 08/19/2012 1241   UROBILINOGEN 0.2 03/20/2009 0645   NITRITE POSITIVE (A) 10/17/2015 1819   LEUKOCYTESUR MODERATE (A)  10/17/2015 1819   Sepsis Labs: @LABRCNTIP (procalcitonin:4,lacticidven:4) ) Recent Results (from the past 240 hour(s))  MRSA PCR Screening     Status: Abnormal   Collection Time: 10/17/15 11:15 PM  Result Value Ref Range Status   MRSA by PCR POSITIVE (A) NEGATIVE Final    Comment:        The GeneXpert MRSA Assay (FDA approved for NASAL specimens only), is one component of a comprehensive MRSA colonization surveillance program. It is not intended to diagnose MRSA infection nor to guide or monitor treatment for MRSA infections. RESULT CALLED TO, READ BACK BY AND VERIFIED  WITH: Thomasenia Bottoms [id number] 10/18/15 [facility]          Radiology Studies: Ct Head Wo Contrast  Result Date: 10/17/2015 CLINICAL DATA:  Acute encephalopathy EXAM: CT HEAD WITHOUT CONTRAST TECHNIQUE: Contiguous axial images were obtained from the base of the skull through the vertex without intravenous contrast. COMPARISON:  Head CT 06/19/2009 FINDINGS: Brain: No mass lesion, intraparenchymal hemorrhage or extra-axial collection. No evidence of acute cortical infarct. There is confluent hypoattenuation within the periventricular white matter compatible with advanced chronic microvascular ischemia. No hydrocephalus. Vascular: Hyper attenuating appearance of the vessels is likely secondary to contrast agent administration earlier the same day for a different study. Skull: Normal visualized skull base, calvarium and extracranial soft tissues. Sinuses/Orbits: Partial opacification of the left posterior ethmoid sinus. No mastoid effusion or paranasal sinus fluid level. Normal orbits. IMPRESSION: 1. No acute intracranial abnormality. 2. Advanced chronic microvascular ischemic disease. Electronically Signed   By: Ulyses Jarred M.D.   On: 10/17/2015 22:13   Ct Abdomen Pelvis W Contrast  Result Date: 10/17/2015 CLINICAL DATA:  Diarrhea and right lower quadrant pain for 2 days EXAM: CT ABDOMEN AND PELVIS WITH CONTRAST TECHNIQUE:  Multidetector CT imaging of the abdomen and pelvis was performed using the standard protocol following bolus administration of intravenous contrast. CONTRAST:  4mL ISOVUE-300 IOPAMIDOL (ISOVUE-300) INJECTION 61% COMPARISON:  None. FINDINGS: Lower chest: No focal infiltrate or sizable effusion is noted. Multiple calcified hilar lymph nodes are seen. Coronary calcifications are noted. Hepatobiliary: The gallbladder has been surgically removed. The liver is within normal limits. Pancreas: No mass, inflammatory changes, or other significant abnormality. Spleen: Multiple calcified granulomas are seen. Adrenals/Urinary Tract: The adrenal glands are within normal limits. Kidneys demonstrate normal enhancement bilaterally with bilateral excretion. No renal calculi or obstructive changes are noted. The bladder is decompressed. Stomach/Bowel: The appendix has been surgically removed. Scattered diverticular changes are noted particularly in the sigmoid colon. Some very early inflammatory changes are noted along the sigmoid colon which may represent early diverticulitis. No perforation or abscess formation is noted. Vascular/Lymphatic: Aortoiliac calcifications are noted without aneurysmal dilatation. No significant lymphadenopathy is noted. Reproductive: Prostate is mildly enlarged indenting upon the bladder inferiorly. Other: None. Musculoskeletal: Degenerative changes of the lumbar spine are noted. IMPRESSION: Changes suggestive of early diverticulitis in the sigmoid colon. Prostatic enlargement. Postsurgical changes without acute abnormality. Electronically Signed   By: Inez Catalina M.D.   On: 10/17/2015 20:41   Dg Chest Port 1 View  Result Date: 10/17/2015 CLINICAL DATA:  Acute onset of leukocytosis.  Initial encounter. EXAM: PORTABLE CHEST 1 VIEW COMPARISON:  Chest radiograph performed 09/05/2013 FINDINGS: The lungs are well-aerated. Mild vascular congestion is noted. Mild left basilar and right mid lung opacities  could reflect pneumonia. There is no evidence of pleural effusion or pneumothorax. The cardiomediastinal silhouette is borderline enlarged. A pacemaker is noted overlying the left chest wall, with leads ending overlying the right atrium and right ventricle. No acute osseous abnormalities are seen. IMPRESSION: Mild left basilar and right mid lung opacities could reflect pneumonia. Underlying mild vascular congestion and borderline cardiomegaly. Followup PA and lateral chest X-ray is recommended in 3-4 weeks following trial of antibiotic therapy to ensure resolution and exclude underlying malignancy. Electronically Signed   By: Garald Balding M.D.   On: 10/17/2015 21:52      Scheduled Meds: . ALPRAZolam  0.25 mg Oral BID  . amLODipine  2.5 mg Oral Daily  . aspirin  81 mg Oral Daily  . atorvastatin  20 mg  Oral QPM  . cefTRIAXone (ROCEPHIN)  IV  2 g Intravenous Q24H  . donepezil  23 mg Oral QHS  . enoxaparin (LOVENOX) injection  40 mg Subcutaneous QHS  . ferrous sulfate  325 mg Oral BID  . HYDROcodone-acetaminophen  1 tablet Oral BID  . insulin aspart  0-9 Units Subcutaneous TID WC  . isosorbide mononitrate  30 mg Oral Daily  . memantine  10 mg Oral BID  . metoprolol succinate  25 mg Oral Daily  . metronidazole  500 mg Intravenous Q8H  . pantoprazole  40 mg Oral Daily  . pramipexole  0.25 mg Oral Q2000  . sertraline  50 mg Oral Daily   Continuous Infusions: . sodium chloride 75 mL/hr at 10/18/15 0551     LOS: 0 days    Time spent in minutes: 10    [place], MD [facility] Pager: [email or web address] Password [email or web address] 10/18/2015, 1:08 PM

## 2015-10-18 NOTE — Progress Notes (Signed)
   10/18/15 1810  Vitals  Temp 98.4 F (36.9 C)  Temp Source Oral  BP (!) 145/91  BP Location Right Arm  BP Method Automatic  Patient Position (if appropriate) Sitting  Pulse Rate 78  Pulse Rate Source Monitor  Oxygen Therapy  SpO2 100 %  O2 Device Room Air  Pain Assessment  Pain Assessment Faces  Faces Pain Scale 6  Pain Type Acute pain  Pain Location Generalized    Pt up walking to the bathroom without assistance. Student Nurse responded to chair alarm and assisted pt to the bathroom. The pt became very shaky while sitting on the toilet. Vital signs taken while pt sitting and are listed above. No cause noted for shaking related to vital signs. Also checked CBG because pt had just received 1 unit of Novolog for CBG of 125. The pt was ambulated back to bed by the student nurse and bedside RN. Pt stopped shaking a few minutes after he was back in bed. Unable to explain what caused the shaking. Pt also reported generalized pain after getting back in bed. Will give Tylenol and continue to monitor pt for changes.

## 2015-10-19 DIAGNOSIS — I481 Persistent atrial fibrillation: Secondary | ICD-10-CM

## 2015-10-19 DIAGNOSIS — F0390 Unspecified dementia without behavioral disturbance: Secondary | ICD-10-CM

## 2015-10-19 DIAGNOSIS — R319 Hematuria, unspecified: Secondary | ICD-10-CM

## 2015-10-19 DIAGNOSIS — N390 Urinary tract infection, site not specified: Secondary | ICD-10-CM

## 2015-10-19 DIAGNOSIS — G9340 Encephalopathy, unspecified: Secondary | ICD-10-CM

## 2015-10-19 DIAGNOSIS — N179 Acute kidney failure, unspecified: Secondary | ICD-10-CM

## 2015-10-19 LAB — GASTROINTESTINAL PANEL BY PCR, STOOL (REPLACES STOOL CULTURE)
ADENOVIRUS F40/41: NOT DETECTED
ASTROVIRUS: NOT DETECTED
Campylobacter species: NOT DETECTED
Cryptosporidium: NOT DETECTED
Cyclospora cayetanensis: NOT DETECTED
E. coli O157: NOT DETECTED
ENTEROAGGREGATIVE E COLI (EAEC): NOT DETECTED
ENTEROPATHOGENIC E COLI (EPEC): NOT DETECTED
ENTEROTOXIGENIC E COLI (ETEC): NOT DETECTED
Entamoeba histolytica: NOT DETECTED
GIARDIA LAMBLIA: NOT DETECTED
Norovirus GI/GII: NOT DETECTED
Plesimonas shigelloides: NOT DETECTED
Rotavirus A: NOT DETECTED
Salmonella species: NOT DETECTED
Sapovirus (I, II, IV, and V): NOT DETECTED
Shiga like toxin producing E coli (STEC): NOT DETECTED
Shigella/Enteroinvasive E coli (EIEC): NOT DETECTED
VIBRIO CHOLERAE: NOT DETECTED
VIBRIO SPECIES: NOT DETECTED
YERSINIA ENTEROCOLITICA: NOT DETECTED

## 2015-10-19 LAB — GLUCOSE, CAPILLARY
GLUCOSE-CAPILLARY: 128 mg/dL — AB (ref 65–99)
Glucose-Capillary: 138 mg/dL — ABNORMAL HIGH (ref 65–99)
Glucose-Capillary: 182 mg/dL — ABNORMAL HIGH (ref 65–99)
Glucose-Capillary: 112 mg/dL — ABNORMAL HIGH (ref 65–99)

## 2015-10-19 LAB — CBC
HEMATOCRIT: 40.9 % (ref 39.0–52.0)
Hemoglobin: 13.8 g/dL (ref 13.0–17.0)
MCH: 31.2 pg (ref 26.0–34.0)
MCHC: 33.7 g/dL (ref 30.0–36.0)
MCV: 92.3 fL (ref 78.0–100.0)
Platelets: 177 10*3/uL (ref 150–400)
RBC: 4.43 MIL/uL (ref 4.22–5.81)
RDW: 14 % (ref 11.5–15.5)
WBC: 10.9 10*3/uL — AB (ref 4.0–10.5)

## 2015-10-19 LAB — BASIC METABOLIC PANEL
Anion gap: 10 (ref 5–15)
BUN: 19 mg/dL (ref 6–20)
CALCIUM: 8.5 mg/dL — AB (ref 8.9–10.3)
CO2: 23 mmol/L (ref 22–32)
CREATININE: 1.35 mg/dL — AB (ref 0.61–1.24)
Chloride: 110 mmol/L (ref 101–111)
GFR calc non Af Amer: 46 mL/min — ABNORMAL LOW (ref >60)
GFR, EST AFRICAN AMERICAN: 53 mL/min — AB (ref >60)
Glucose, Bld: 131 mg/dL — ABNORMAL HIGH (ref 65–99)
Potassium: 3.9 mmol/L (ref 3.5–5.1)
SODIUM: 143 mmol/L (ref 135–145)

## 2015-10-19 MED ORDER — CHLORHEXIDINE GLUCONATE CLOTH 2 % EX PADS
6.0000 | MEDICATED_PAD | Freq: Every day | CUTANEOUS | Status: DC
  Administered 2015-10-19 – 2015-10-21 (×3): 6 via TOPICAL

## 2015-10-19 MED ORDER — MUPIROCIN 2 % EX OINT
1.0000 "application " | TOPICAL_OINTMENT | Freq: Two times a day (BID) | CUTANEOUS | Status: DC
  Administered 2015-10-19 – 2015-10-21 (×5): 1 via NASAL
  Filled 2015-10-19 (×2): qty 22

## 2015-10-20 ENCOUNTER — Encounter (HOSPITAL_COMMUNITY): Payer: Self-pay | Admitting: Radiology

## 2015-10-20 ENCOUNTER — Inpatient Hospital Stay (HOSPITAL_COMMUNITY): Payer: Medicare Other

## 2015-10-20 LAB — BASIC METABOLIC PANEL
Anion gap: 9 (ref 5–15)
BUN: 16 mg/dL (ref 6–20)
CALCIUM: 8 mg/dL — AB (ref 8.9–10.3)
CO2: 25 mmol/L (ref 22–32)
CREATININE: 1.28 mg/dL — AB (ref 0.61–1.24)
Chloride: 105 mmol/L (ref 101–111)
GFR calc Af Amer: 56 mL/min — ABNORMAL LOW (ref >60)
GFR, EST NON AFRICAN AMERICAN: 49 mL/min — AB (ref >60)
GLUCOSE: 117 mg/dL — AB (ref 65–99)
Potassium: 3.2 mmol/L — ABNORMAL LOW (ref 3.5–5.1)
SODIUM: 139 mmol/L (ref 135–145)

## 2015-10-20 LAB — CBC
HCT: 37.9 % — ABNORMAL LOW (ref 39.0–52.0)
Hemoglobin: 12.7 g/dL — ABNORMAL LOW (ref 13.0–17.0)
MCH: 30.4 pg (ref 26.0–34.0)
MCHC: 33.5 g/dL (ref 30.0–36.0)
MCV: 90.7 fL (ref 78.0–100.0)
PLATELETS: 210 10*3/uL (ref 150–400)
RBC: 4.18 MIL/uL — ABNORMAL LOW (ref 4.22–5.81)
RDW: 13.4 % (ref 11.5–15.5)
WBC: 11.4 10*3/uL — ABNORMAL HIGH (ref 4.0–10.5)

## 2015-10-20 LAB — URINE CULTURE: Culture: >=100000 — AB

## 2015-10-20 LAB — GLUCOSE, CAPILLARY
GLUCOSE-CAPILLARY: 126 mg/dL — AB (ref 65–99)
GLUCOSE-CAPILLARY: 109 mg/dL — AB (ref 65–99)
Glucose-Capillary: 128 mg/dL — ABNORMAL HIGH (ref 65–99)
Glucose-Capillary: 130 mg/dL — ABNORMAL HIGH (ref 65–99)

## 2015-10-20 MED ORDER — DIPHENOXYLATE-ATROPINE 2.5-0.025 MG PO TABS: 1.0000 | ORAL_TABLET | Freq: Four times a day (QID) | ORAL | Status: DC | PRN

## 2015-10-20 MED ORDER — IOPAMIDOL (ISOVUE-300) INJECTION 61%: 30.0000 mL | Freq: Once | INTRAVENOUS | Status: DC | PRN

## 2015-10-20 MED ORDER — POTASSIUM CHLORIDE CRYS ER 20 MEQ PO TBCR
40.0000 meq | EXTENDED_RELEASE_TABLET | Freq: Once | ORAL | Status: AC
  Administered 2015-10-20: 40 meq via ORAL
  Filled 2015-10-20: qty 2

## 2015-10-20 MED ORDER — TAMSULOSIN HCL 0.4 MG PO CAPS
0.4000 mg | ORAL_CAPSULE | Freq: Every day | ORAL | Status: DC
  Administered 2015-10-20 – 2015-10-21 (×2): 0.4 mg via ORAL
  Filled 2015-10-20 (×2): qty 1

## 2015-10-20 NOTE — Progress Notes (Signed)
PROGRESS NOTE    Jon Davis  [id number] DOB: 31-Jan-1927 DOA: 10/17/2015  PCP: Haywood Pao, MD   Brief Narrative:  Jon Davis is a 82 y.o. male with paroxysmal atrial fibrillation, hypertension, CAD, CVA, severe dementia was brought to the ER after patient has been having poor oral intake with persistent diarrhea over the last 2 days with occasional vomiting. CT of the abdomen and pelvis revealed sigmoid diverticulitis. He had a mildly elevated lactic acid, a UA that was positive for UTI, gross cytosis and acute renal failure.  Subjective: No complaints. Confused.   Assessment & Plan:   Principal Problem:  Sigmoid diverticulitis -Continue Rocephin and Flagyl -Liquid diet- -Apparently he had some diarrhea at home but has not had any in the hospital- GI panel negative for pathogens - abdominal tenderness more diffuse today where previously it was only in left lower abd and suprapubic area - will need to repeat CT abdomen/ pelvis  Active Problems: Acute renal insufficiency -Mildly increase in creatinine from baseline of 1.2 in 2014 to 1.7-  improved to 1.35 after IV fluids and therefore likely pre-renal  UTI - U culture growing e coli- pan-sensitive- Rocephin being given   Hyperlipidemia -Continue statin  Essential hypertension - Norvasc, Toprol, Imdur   Paroxysmal Atrial fibrillation  -Continue Toprol -On baby aspirin for secondary stroke prevention-poor candidate for anticoagulation per cardiology  Pacemaker for V. tach    Dementia -Continue Aricept and Namenda   DVT prophylaxis: Lovenox Code Status: DO NOT RESUSCITATE Family Communication:  Disposition Plan: Home when stable Consultants:   None Procedures:   None Antimicrobials:  Anti-infectives    Start     Dose/Rate Route Frequency Ordered Stop   10/18/15 0800  cefTRIAXone (ROCEPHIN) 2 g in dextrose 5 % 50 mL IVPB     2 g 100 mL/hr over 30 Minutes Intravenous Every 24 hours 10/17/15  2215     10/18/15 0600  metroNIDAZOLE (FLAGYL) IVPB 500 mg     500 mg 100 mL/hr over 60 Minutes Intravenous Every 8 hours 10/17/15 2215     10/17/15 2145  metroNIDAZOLE (FLAGYL) IVPB 500 mg  Status:  Discontinued     500 mg 100 mL/hr over 60 Minutes Intravenous Every 8 hours 10/17/15 2134 10/17/15 2215   10/17/15 2100  ciprofloxacin (CIPRO) IVPB 400 mg     400 mg 200 mL/hr over 60 Minutes Intravenous  Once 10/17/15 2051 10/17/15 2211   10/17/15 2100  metroNIDAZOLE (FLAGYL) IVPB 500 mg     500 mg 100 mL/hr over 60 Minutes Intravenous  Once 10/17/15 2051 10/18/15 0056       Objective: Vitals:   10/19/15 2026 10/20/15 0424 10/20/15 0525 10/20/15 1223  BP: (!) 159/71 (!) 165/70 (!) 159/47 (!) 168/85  Pulse: 75 79  83  Resp: 17 16    Temp: 98.7 F (37.1 C) 98.7 F (37.1 C)    TempSrc: Oral Oral    SpO2: 97% 98%    Weight:  90.4 kg (199 lb 4.7 oz)    Height:        Intake/Output Summary (Last 24 hours) at 10/20/15 1252 Last data filed at 10/20/15 0918  Gross per 24 hour  Intake              580 ml  Output              375 ml  Net              205 ml  Filed Weights   10/17/15 2240 10/19/15 0525 10/20/15 0424  Weight: 91.9 kg (202 lb 9.6 oz) 92.3 kg (203 lb 7.8 oz) 90.4 kg (199 lb 4.7 oz)    Examination: General exam: Appears comfortable  HEENT: PERRLA, oral mucosa moist, no sclera icterus or thrush Respiratory system: Clear to auscultation. Respiratory effort normal. Cardiovascular system: S1 & S2 heard, RRR.  No murmurs  Gastrointestinal system: Abdomen soft, Tender through out abdomen/pelvis today, nondistended. Normal bowel sound. No organomegaly Central nervous system: Alert and oriented. No focal neurological deficits. Extremities: No cyanosis, clubbing or edema Skin: No rashes or ulcers Psychiatry:  Mood & affect appropriate.    Data Reviewed: I have personally reviewed following labs and imaging studies  CBC:  Recent Labs Lab 10/17/15 1852 10/17/15 2312  10/18/15 0536 10/19/15 0822  WBC 21.2* 17.6* 14.5* 10.9*  NEUTROABS 17.5*  --   --   --   HGB 14.1 13.4 12.9* 13.8  HCT 40.8 40.3 38.9* 40.9  MCV 90.1 92.2 90.3 92.3  PLT 206 193 183 [id number]   Basic Metabolic Panel:  Recent Labs Lab 10/17/15 1852 10/17/15 2312 10/18/15 0536 10/19/15 0822  NA 142  --  143 143  K 4.0  --  3.4* 3.9  CL 109  --  109 110  CO2 24  --  26 23  GLUCOSE 169*  --  135* 131*  BUN 29*  --  27* 19  CREATININE 1.78* 1.66* 1.60* 1.35*  CALCIUM 8.6*  --  8.3* 8.5*   GFR: Estimated Creatinine Clearance: 42.3 mL/min (by C-G formula based on SCr of 1.35 mg/dL). Liver Function Tests:  Recent Labs Lab 10/17/15 1852 10/18/15 0536  AST 18 18  ALT 11* 12*  ALKPHOS 67 64  BILITOT 0.9 0.8  PROT 7.1 7.1  ALBUMIN 3.8 3.6    Recent Labs Lab 10/17/15 1852  LIPASE 22   No results for input(s): AMMONIA in the last 168 hours. Coagulation Profile:  Recent Labs Lab 10/17/15 2123  INR 1.20   Cardiac Enzymes: No results for input(s): CKTOTAL, CKMB, CKMBINDEX, TROPONINI in the last 168 hours. BNP (last 3 results) No results for input(s): PROBNP in the last 8760 hours. HbA1C: No results for input(s): HGBA1C in the last 72 hours. CBG:  Recent Labs Lab 10/19/15 1148 10/19/15 1627 10/19/15 2352 10/20/15 0748 10/20/15 1211  GLUCAP 182* 112* 128* 126* 128*   Lipid Profile: No results for input(s): CHOL, HDL, LDLCALC, TRIG, CHOLHDL, LDLDIRECT in the last 72 hours. Thyroid Function Tests: No results for input(s): TSH, T4TOTAL, FREET4, T3FREE, THYROIDAB in the last 72 hours. Anemia Panel: No results for input(s): VITAMINB12, FOLATE, FERRITIN, TIBC, IRON, RETICCTPCT in the last 72 hours. Urine analysis:    Component Value Date/Time   COLORURINE YELLOW 10/17/2015 1819   APPEARANCEUR CLOUDY (A) 10/17/2015 1819   LABSPEC 1.016 10/17/2015 1819   PHURINE 5.5 10/17/2015 1819   GLUCOSEU NEGATIVE 10/17/2015 1819   HGBUR LARGE (A) 10/17/2015 1819    BILIRUBINUR NEGATIVE 10/17/2015 1819   BILIRUBINUR small 08/19/2012 1241   KETONESUR NEGATIVE 10/17/2015 1819   PROTEINUR 30 (A) 10/17/2015 1819   UROBILINOGEN 0.2 08/19/2012 1241   UROBILINOGEN 0.2 03/20/2009 0645   NITRITE POSITIVE (A) 10/17/2015 1819   LEUKOCYTESUR MODERATE (A) 10/17/2015 1819   Sepsis Labs: @LABRCNTIP (procalcitonin:4,lacticidven:4) ) Recent Results (from the past 240 hour(s))  Urine culture     Status: Abnormal   Collection Time: 10/17/15  6:19 PM  Result Value Ref Range Status   Specimen  Description URINE, RANDOM  Final   Special Requests NONE  Final   Culture >=100,000 COLONIES/mL ESCHERICHIA COLI (A)  Final   Report Status 10/20/2015 FINAL  Final   Organism ID, Bacteria ESCHERICHIA COLI (A)  Final      Susceptibility   Escherichia coli - MIC*    AMPICILLIN 4 SENSITIVE Sensitive     CEFAZOLIN <=4 SENSITIVE Sensitive     CEFTRIAXONE <=1 SENSITIVE Sensitive     CIPROFLOXACIN <=0.25 SENSITIVE Sensitive     GENTAMICIN <=1 SENSITIVE Sensitive     IMIPENEM <=0.25 SENSITIVE Sensitive     NITROFURANTOIN <=16 SENSITIVE Sensitive     TRIMETH/SULFA <=20 SENSITIVE Sensitive     AMPICILLIN/SULBACTAM <=2 SENSITIVE Sensitive     PIP/TAZO <=4 SENSITIVE Sensitive     Extended ESBL NEGATIVE Sensitive     * >=100,000 COLONIES/mL ESCHERICHIA COLI  MRSA PCR Screening     Status: Abnormal   Collection Time: 10/17/15 11:15 PM  Result Value Ref Range Status   MRSA by PCR POSITIVE (A) NEGATIVE Final    Comment:        The GeneXpert MRSA Assay (FDA approved for NASAL specimens only), is one component of a comprehensive MRSA colonization surveillance program. It is not intended to diagnose MRSA infection nor to guide or monitor treatment for MRSA infections. RESULT CALLED TO, READ BACK BY AND VERIFIED WITH: R.CORCORAN,RN 0238 10/18/15 [facility]   C difficile quick scan w PCR reflex     Status: None   Collection Time: 10/18/15  5:19 PM  Result Value Ref Range Status   C  Diff antigen NEGATIVE NEGATIVE Final   C Diff toxin NEGATIVE NEGATIVE Final   C Diff interpretation No C. difficile detected.  Final  Gastrointestinal Panel by PCR , Stool     Status: None   Collection Time: 10/18/15  5:19 PM  Result Value Ref Range Status   Campylobacter species NOT DETECTED NOT DETECTED Final   Plesimonas shigelloides NOT DETECTED NOT DETECTED Final   Salmonella species NOT DETECTED NOT DETECTED Final   Yersinia enterocolitica NOT DETECTED NOT DETECTED Final   Vibrio species NOT DETECTED NOT DETECTED Final   Vibrio cholerae NOT DETECTED NOT DETECTED Final   Enteroaggregative E coli (EAEC) NOT DETECTED NOT DETECTED Final   Enteropathogenic E coli (EPEC) NOT DETECTED NOT DETECTED Final   Enterotoxigenic E coli (ETEC) NOT DETECTED NOT DETECTED Final   Shiga like toxin producing E coli (STEC) NOT DETECTED NOT DETECTED Final   E. coli O157 NOT DETECTED NOT DETECTED Final   Shigella/Enteroinvasive E coli (EIEC) NOT DETECTED NOT DETECTED Final   Cryptosporidium NOT DETECTED NOT DETECTED Final   Cyclospora cayetanensis NOT DETECTED NOT DETECTED Final   Entamoeba histolytica NOT DETECTED NOT DETECTED Final   Giardia lamblia NOT DETECTED NOT DETECTED Final   Adenovirus F40/41 NOT DETECTED NOT DETECTED Final   Astrovirus NOT DETECTED NOT DETECTED Final   Norovirus GI/GII NOT DETECTED NOT DETECTED Final   Rotavirus A NOT DETECTED NOT DETECTED Final   Sapovirus (I, II, IV, and V) NOT DETECTED NOT DETECTED Final         Radiology Studies: No results found.    Scheduled Meds: . ALPRAZolam  0.25 mg Oral BID  . amLODipine  2.5 mg Oral Daily  . aspirin  81 mg Oral Daily  . atorvastatin  20 mg Oral QPM  . cefTRIAXone (ROCEPHIN)  IV  2 g Intravenous Q24H  . Chlorhexidine Gluconate Cloth  6 each Topical Q0600  .  donepezil  23 mg Oral QHS  . enoxaparin (LOVENOX) injection  40 mg Subcutaneous QHS  . ferrous sulfate  325 mg Oral BID  . HYDROcodone-acetaminophen  1 tablet  Oral BID  . insulin aspart  0-9 Units Subcutaneous TID WC  . isosorbide mononitrate  30 mg Oral Daily  . memantine  10 mg Oral BID  . metoprolol succinate  25 mg Oral Daily  . metronidazole  500 mg Intravenous Q8H  . mupirocin ointment  1 application Nasal BID  . pantoprazole  40 mg Oral Daily  . pramipexole  0.25 mg Oral Q2000  . sertraline  50 mg Oral Daily   Continuous Infusions:     LOS: 2 days    Time spent in minutes: 17    [place], MD [facility] Pager: [email or web address] Password [email or web address] 10/20/2015, 12:52 PM

## 2015-10-21 DIAGNOSIS — E1159 Type 2 diabetes mellitus with other circulatory complications: Secondary | ICD-10-CM

## 2015-10-21 DIAGNOSIS — R339 Retention of urine, unspecified: Secondary | ICD-10-CM

## 2015-10-21 DIAGNOSIS — R197 Diarrhea, unspecified: Secondary | ICD-10-CM

## 2015-10-21 DIAGNOSIS — N390 Urinary tract infection, site not specified: Secondary | ICD-10-CM

## 2015-10-21 DIAGNOSIS — Z950 Presence of cardiac pacemaker: Secondary | ICD-10-CM

## 2015-10-21 LAB — GLUCOSE, CAPILLARY
GLUCOSE-CAPILLARY: 140 mg/dL — AB (ref 65–99)
Glucose-Capillary: 115 mg/dL — ABNORMAL HIGH (ref 65–99)

## 2015-10-21 LAB — BASIC METABOLIC PANEL
ANION GAP: 10 (ref 5–15)
BUN: 14 mg/dL (ref 6–20)
CHLORIDE: 107 mmol/L (ref 101–111)
CO2: 23 mmol/L (ref 22–32)
Calcium: 8.2 mg/dL — ABNORMAL LOW (ref 8.9–10.3)
Creatinine, Ser: 1.16 mg/dL (ref 0.61–1.24)
GFR calc Af Amer: >60 mL/min (ref >60)
GFR, EST NON AFRICAN AMERICAN: 55 mL/min — AB (ref >60)
GLUCOSE: 121 mg/dL — AB (ref 65–99)
POTASSIUM: 3.5 mmol/L (ref 3.5–5.1)
Sodium: 140 mmol/L (ref 135–145)

## 2015-10-21 MED ORDER — TAMSULOSIN HCL 0.4 MG PO CAPS: 0.4000 mg | ORAL_CAPSULE | Freq: Every day | ORAL | 0 refills | Status: DC

## 2015-10-21 MED ORDER — CEFUROXIME AXETIL 500 MG PO TABS: 500.0000 mg | ORAL_TABLET | Freq: Two times a day (BID) | ORAL | 0 refills | Status: DC

## 2015-10-21 MED ORDER — METRONIDAZOLE 500 MG PO TABS: 500.0000 mg | ORAL_TABLET | Freq: Three times a day (TID) | ORAL | 0 refills | Status: DC

## 2015-10-21 NOTE — Discharge Instructions (Signed)
Foley Catheter Care, Adult °A Foley catheter is a soft, flexible tube that is placed into the bladder to drain urine. A Foley catheter may be inserted if: °· You leak urine or are not able to control when you urinate (urinary incontinence). °· You are not able to urinate when you need to (urinary retention). °· You had prostate surgery or surgery on the genitals. °· You have certain medical conditions, such as multiple sclerosis, dementia, or a spinal cord injury. °If you are going home with a Foley catheter in place, follow the instructions below. °TAKING CARE OF THE CATHETER °1. Wash your hands with soap and water. °2. Using mild soap and warm water on a clean washcloth: °¨ Clean the area on your body closest to the catheter insertion site using a circular motion, moving away from the catheter. Never wipe toward the catheter because this could sweep bacteria up into the urethra and cause infection. °¨ Remove all traces of soap. Pat the area dry with a clean towel. For males, reposition the foreskin. °3. Attach the catheter to your leg so there is no tension on the catheter. Use adhesive tape or a leg strap. If you are using adhesive tape, remove any sticky residue left behind by the previous tape you used. °4. Keep the drainage bag below the level of the bladder, but keep it off the floor. °5. Check throughout the day to be sure the catheter is working and urine is draining freely. Make sure the tubing does not become kinked. °6. Do not pull on the catheter or try to remove it. Pulling could damage internal tissues. °TAKING CARE OF THE DRAINAGE BAGS °You will be given two drainage bags to take home. One is a large overnight drainage bag, and the other is a smaller leg bag that fits underneath clothing. You may wear the overnight bag at any time, but you should never wear the smaller leg bag at night. Follow the instructions below for how to empty, change, and clean your drainage bags. °Emptying the Drainage  Bag °You must empty your drainage bag when it is  -½ full or at least 2-3 times a day. °1. Wash your hands with soap and water. °2. Keep the drainage bag below your hips, below the level of your bladder. This stops urine from going back into the tubing and into your bladder. °3. Hold the dirty bag over the toilet or a clean container. °4. Open the pour spout at the bottom of the bag and empty the urine into the toilet or container. Do not let the pour spout touch the toilet, container, or any other surface. Doing so can place bacteria on the bag, which can cause an infection. °5. Clean the pour spout with a gauze pad or cotton ball that has rubbing alcohol on it. °6. Close the pour spout. °7. Attach the bag to your leg with adhesive tape or a leg strap. °8. Wash your hands well. °Changing the Drainage Bag °Change your drainage bag once a month or sooner if it starts to smell bad or look dirty. Below are steps to follow when changing the drainage bag. °1. Wash your hands with soap and water. °2. Pinch off the rubber catheter so that urine does not spill out. °3. Disconnect the catheter tube from the drainage tube at the connection valve. Do not let the tubes touch any surface. °4. Clean the end of the catheter tube with an alcohol wipe. Use a different alcohol wipe to clean   the end of the drainage tube. °5. Connect the catheter tube to the drainage tube of the clean drainage bag. °6. Attach the new bag to the leg with adhesive tape or a leg strap. Avoid attaching the new bag too tightly. °7. Wash your hands well. °Cleaning the Drainage Bag °1. Wash your hands with soap and water. °2. Wash the bag in warm, soapy water. °3. Rinse the bag thoroughly with warm water. °4. Fill the bag with a solution of white vinegar and water (1 cup vinegar to 1 qt warm water [.2 L vinegar to 1 L warm water]). Close the bag and soak it for 30 minutes in the solution. °5. Rinse the bag with warm water. °6. Hang the bag to dry with the  pour spout open and hanging downward. °7. Store the clean bag (once it is dry) in a clean plastic bag. °8. Wash your hands well. °PREVENTING INFECTION °· Wash your hands before and after handling your catheter. °· Take showers daily and wash the area where the catheter enters your body. Do not take baths. Replace wet leg straps with dry ones, if this applies. °· Do not use powders, sprays, or lotions on the genital area. Only use creams, lotions, or ointments as directed by your caregiver. °· For females, wipe from front to back after each bowel movement. °· Drink enough fluids to keep your urine clear or pale yellow unless you have a fluid restriction. °· Do not let the drainage bag or tubing touch or lie on the floor. °· Wear cotton underwear to absorb moisture and to keep your skin drier. °SEEK MEDICAL CARE IF:  °· Your urine is cloudy or smells unusually bad. °· Your catheter becomes clogged. °· You are not draining urine into the bag or your bladder feels full. °· Your catheter starts to leak. °SEEK IMMEDIATE MEDICAL CARE IF:  °· You have pain, swelling, redness, or pus where the catheter enters the body. °· You have pain in the abdomen, legs, lower back, or bladder. °· You have a fever. °· You see blood fill the catheter, or your urine is pink or red. °· You have nausea, vomiting, or chills. °· Your catheter gets pulled out. °MAKE SURE YOU:  °· Understand these instructions. °· Will watch your condition. °· Will get help right away if you are not doing well or get worse. °  °This information is not intended to replace advice given to you by your health care provider. Make sure you discuss any questions you have with your health care provider. °  °Document Released: 01/27/2005 Document Revised: 06/13/2013 Document Reviewed: 01/19/2012 °[employer] Patient Education ©2016 [employer] ° °

## 2015-10-21 NOTE — Discharge Summary (Signed)
Physician Discharge Summary  Jon Davis [id number] DOB: 12-31-1926 DOA: 10/17/2015  PCP: Haywood Pao, MD  Admit date: 10/17/2015 Discharge date: 10/21/2015  Admitted From: independent living   Disposition:  Return to independent living   Recommendations for Outpatient Follow-up:  1. Need Urology f/u in 1 wk  Home Health:  Yes for foley care  Equipment/Devices:  none    Discharge Condition:  stable   CODE STATUS:  DNR   Diet recommendation:  Diabetic, heart healthy Consultations:  none    Discharge Diagnoses:  Principal Problem:   Sigmoid diverticulitis Active Problems:   ARF (acute renal failure) (HCC)   UTI (urinary tract infection)   Urinary retention   Type 2 diabetes mellitus with vascular disease ([place])   Hyperlipidemia   Essential hypertension   Atrial fibrillation (HCC)   Pacemaker   Dementia   Diarrhea    Subjective: No complaint of pain, nausea, vomiting, diarrhea.   Brief Summary: Jon Breitenbach Burnettis a 82 y.o.malewith paroxysmal atrial fibrillation, hypertension, CAD, CVA, severe dementia was brought to the ER after patient has been having poor oral intake with persistent diarrhea over the last 2 days with occasional vomiting. CT of the abdomen and pelvis revealed sigmoid diverticulitis. He had a mildly elevated lactic acid, a UA that was positive for UTI, gross cytosis and acute renal failure.  Hospital Course:  Principal Problem:  Sigmoid diverticulitis -he was started on Rocephin and Flagyl when first admitted- transitioned Rocephin to Ceftin -Liquid diet has been advance to solid food which he is tolerating  -Apparently he had some diarrhea at home but has not had any in the hospital- GI panel negative for pathogens  Urinary retention - despite the fact that he was making a normal amount of urine, his abdomen felt distended and was quite tender- my initial suspicion was the diverticulitis was worsening - further eval with a repeat CT of  the abdomen revealed severe bladder distension  Which was confirmed on bladder scan - s/p foley with complete resolution of pain and distension - have started Flomax- have requested on call Urology to have office call him for and appt for f/u in 1 wk   Acute renal insufficiency -Mildly increase in creatinine from baseline of 1.2 in 2014 to 1.7-  improved to 1.35 after IV fluids and after placing foley and therefore likely pre-renal and secondary to urinary distension  UTI - likely related to urinary retention- U culture growing e coli- pan-sensitive- Rocephin being given- transition to Ceftin on discharge   Hyperlipidemia -Continue statin  Essential hypertension - Norvasc, Toprol, Imdur   Paroxysmal Atrial fibrillation  -Continue Toprol -On baby aspirin for secondary stroke prevention-poor candidate for anticoagulation per cardiology  Pacemaker for V. tach    Dementia -Continue Aricept and Namenda    Discharge Instructions  Discharge Instructions    Diet - low sodium heart healthy    Complete by:  As directed   Discharge instructions    Complete by:  As directed   Needs a follow up appt with Urology in 1 wk.   Increase activity slowly    Complete by:  As directed       Medication List    TAKE these medications   ALPRAZolam 0.25 MG tablet Commonly known as:  XANAX Take 0.25 mg by mouth 2 (two) times daily.   amLODipine 5 MG tablet Commonly known as:  NORVASC Take 2.5 mg by mouth daily.   aspirin 81 MG tablet Take 81 mg  by mouth daily.   atorvastatin 20 MG tablet Commonly known as:  LIPITOR Take 20 mg by mouth every evening.   cefUROXime 500 MG tablet Commonly known as:  CEFTIN Take 1 tablet (500 mg total) by mouth 2 (two) times daily with a meal.   donepezil 23 MG Tabs tablet Commonly known as:  ARICEPT Take 1 tablet (23 mg total) by mouth at bedtime.   ferrous sulfate 325 (65 FE) MG tablet Take 325 mg by mouth 2 (two) times daily.   glipiZIDE  10 MG tablet Commonly known as:  GLUCOTROL Take 10 mg by mouth daily.   HYDROcodone-acetaminophen 5-325 MG tablet Commonly known as:  NORCO/VICODIN Take 1 tablet by mouth 2 (two) times daily.   isosorbide mononitrate 30 MG 24 hr tablet Commonly known as:  IMDUR Take 30 mg by mouth daily.   memantine 10 MG tablet Commonly known as:  NAMENDA Take 10 mg by mouth 2 (two) times daily.   metoprolol succinate 25 MG 24 hr tablet Commonly known as:  TOPROL-XL Take 1 tablet (25 mg total) by mouth daily. Take with or immediately following a meal.   metroNIDAZOLE 500 MG tablet Commonly known as:  FLAGYL Take 1 tablet (500 mg total) by mouth 3 (three) times daily.   pantoprazole 40 MG tablet Commonly known as:  PROTONIX Take 40 mg by mouth daily.   pramipexole 0.125 MG tablet Commonly known as:  MIRAPEX Take 2 tablets (0.25 mg total) by mouth every evening. Two hours before bedtime   sertraline 50 MG tablet Commonly known as:  ZOLOFT Take 50 mg by mouth daily.   tamsulosin 0.4 MG Caps capsule Commonly known as:  FLOMAX Take 1 capsule (0.4 mg total) by mouth daily.      [facility] Follow up in 1 week(s).   Contact information: [place] [address] [phone]        Haywood Pao, MD Follow up in 1 week(s).   Specialty:  Internal Medicine Contact information: [place] [address] [phone]          Allergies  Allergen Reactions  . Penicillins Rash    Has patient had a PCN reaction causing immediate rash, facial/tongue/throat swelling, SOB or lightheadedness with hypotension: No  Has patient had a PCN reaction causing severe rash involving mucus membranes or skin necrosis: unknown Has patient had a PCN reaction that required hospitalization: No  Has patient had a PCN reaction occurring within the last 10 years: No  If all of the above answers are "NO", then may proceed  with Cephalosporin use.     Procedures/Studies:   Ct Abdomen Pelvis Wo Contrast  Result Date: 10/20/2015 CLINICAL DATA:  History of sigmoid diverticulitis on previous CT scan. Patient has previous history of diarrhea and lower abdominal pain. EXAM: CT ABDOMEN AND PELVIS WITHOUT CONTRAST TECHNIQUE: Multidetector CT imaging of the abdomen and pelvis was performed following the standard protocol without IV contrast. COMPARISON:  10/17/2015 FINDINGS: Lower chest: Respiratory motion artifact at the bilateral lung bases. No interval development of focal consolidation or pleural effusion. Vascular calcifications are present within the aorta. There are coronary artery calcifications. Intracardiac leads remain in place. Hepatobiliary: Scattered calcifications in the liver consistent with granuloma. Non contrasted liver appearance otherwise unremarkable. Patient is status post cholecystectomy Pancreas: Mildly atrophic Spleen: Spleen contains multiple calcifications consistent with prior granulomatous disease. Adrenals/Urinary Tract: Bilateral adrenal glands are within normal  limits. Nonspecific bilateral perinephric fat stranding. No focal calcifications. Bilateral renal pelvis appear enlarged compared to prior. There is mild right hydronephrosis of the lower pole calices. Right ureter is also prominent. No stones identified along the course of the ureters. 11 mm round hypodensity along the posterior medial cortex of the mid left kidney, not seen on contrasted exam. The bladder is distended with urine. No calcifications identified within the bladder. Stomach/Bowel: The stomach is collapsed. No dilated small bowel. There is contrast material within the distal small bowel and colon. Numerous sigmoid diverticular again evident with slight wall thickening of the sigmoid colon. No increasing inflammation is present. No focal gas collections to suggest perforation. Appendix not identified. Vascular/Lymphatic: Dense vascular  calcifications are present within the aorta and branch vessels. There are no pathologically enlarged mesenteric or retroperitoneal nodes. Small bilateral inguinal nodes are present. Reproductive: Prostate gland appears slightly enlarged and exerts mild mass effect on the posterior bladder. Other: Trace amount of free fluid in the pelvis, similar compared to prior exam. No free air. Musculoskeletal: Stable sclerotic focus in the left sacrum. No acute osseous abnormality. IMPRESSION: 1. Sigmoid colon diverticula with mild wall thickening suggests mild diverticulitis. No increasing inflammation is present. There is no evidence for perforation or focal abscess. Trace amount of free fluid in the pelvis. 2. Evidence of prior granulomatous disease involving the liver and spleen. 3. Nonspecific bilateral perinephric fat stranding. Interim development of mild right hydronephrosis and mild dilatation of left renal pelvis, possibly secondary to distended urinary bladder. Not seen is an 11 mm hypodensity along the posterior medial cortex of left kidney ; this is of uncertain etiology as it was not identified on contrasted exam. It could represent small nonspecific focal perinephric fluid. 4. Atherosclerotic vascular disease Electronically Signed   By: Donavan Foil M.D.   On: 10/20/2015 13:52   Ct Head Wo Contrast  Result Date: 10/17/2015 CLINICAL DATA:  Acute encephalopathy EXAM: CT HEAD WITHOUT CONTRAST TECHNIQUE: Contiguous axial images were obtained from the base of the skull through the vertex without intravenous contrast. COMPARISON:  Head CT 06/19/2009 FINDINGS: Brain: No mass lesion, intraparenchymal hemorrhage or extra-axial collection. No evidence of acute cortical infarct. There is confluent hypoattenuation within the periventricular white matter compatible with advanced chronic microvascular ischemia. No hydrocephalus. Vascular: Hyper attenuating appearance of the vessels is likely secondary to contrast agent  administration earlier the same day for a different study. Skull: Normal visualized skull base, calvarium and extracranial soft tissues. Sinuses/Orbits: Partial opacification of the left posterior ethmoid sinus. No mastoid effusion or paranasal sinus fluid level. Normal orbits. IMPRESSION: 1. No acute intracranial abnormality. 2. Advanced chronic microvascular ischemic disease. Electronically Signed   By: Ulyses Jarred M.D.   On: 10/17/2015 22:13   Ct Abdomen Pelvis W Contrast  Result Date: 10/17/2015 CLINICAL DATA:  Diarrhea and right lower quadrant pain for 2 days EXAM: CT ABDOMEN AND PELVIS WITH CONTRAST TECHNIQUE: Multidetector CT imaging of the abdomen and pelvis was performed using the standard protocol following bolus administration of intravenous contrast. CONTRAST:  37mL ISOVUE-300 IOPAMIDOL (ISOVUE-300) INJECTION 61% COMPARISON:  None. FINDINGS: Lower chest: No focal infiltrate or sizable effusion is noted. Multiple calcified hilar lymph nodes are seen. Coronary calcifications are noted. Hepatobiliary: The gallbladder has been surgically removed. The liver is within normal limits. Pancreas: No mass, inflammatory changes, or other significant abnormality. Spleen: Multiple calcified granulomas are seen. Adrenals/Urinary Tract: The adrenal glands are within normal limits. Kidneys demonstrate normal enhancement bilaterally with bilateral excretion.  No renal calculi or obstructive changes are noted. The bladder is decompressed. Stomach/Bowel: The appendix has been surgically removed. Scattered diverticular changes are noted particularly in the sigmoid colon. Some very early inflammatory changes are noted along the sigmoid colon which may represent early diverticulitis. No perforation or abscess formation is noted. Vascular/Lymphatic: Aortoiliac calcifications are noted without aneurysmal dilatation. No significant lymphadenopathy is noted. Reproductive: Prostate is mildly enlarged indenting upon the bladder  inferiorly. Other: None. Musculoskeletal: Degenerative changes of the lumbar spine are noted. IMPRESSION: Changes suggestive of early diverticulitis in the sigmoid colon. Prostatic enlargement. Postsurgical changes without acute abnormality. Electronically Signed   By: Inez Catalina M.D.   On: 10/17/2015 20:41   Dg Chest Port 1 View  Result Date: 10/17/2015 CLINICAL DATA:  Acute onset of leukocytosis.  Initial encounter. EXAM: PORTABLE CHEST 1 VIEW COMPARISON:  Chest radiograph performed 09/05/2013 FINDINGS: The lungs are well-aerated. Mild vascular congestion is noted. Mild left basilar and right mid lung opacities could reflect pneumonia. There is no evidence of pleural effusion or pneumothorax. The cardiomediastinal silhouette is borderline enlarged. A pacemaker is noted overlying the left chest wall, with leads ending overlying the right atrium and right ventricle. No acute osseous abnormalities are seen. IMPRESSION: Mild left basilar and right mid lung opacities could reflect pneumonia. Underlying mild vascular congestion and borderline cardiomegaly. Followup PA and lateral chest X-ray is recommended in 3-4 weeks following trial of antibiotic therapy to ensure resolution and exclude underlying malignancy. Electronically Signed   By: Garald Balding M.D.   On: 10/17/2015 21:52       Discharge Exam: Vitals:   10/21/15 0612 10/21/15 1226  BP: (!) 166/72 (!) 161/70  Pulse: 80 77  Resp: 18   Temp: 98.1 F (36.7 C)    Vitals:   10/20/15 1223 10/20/15 1331 10/21/15 0612 10/21/15 1226  BP: (!) 168/85 (!) 148/71 (!) 166/72 (!) 161/70  Pulse: 83 62 80 77  Resp:  20 18   Temp:  98.3 F (36.8 C) 98.1 F (36.7 C)   TempSrc:  Oral Oral   SpO2:  99% 96%   Weight:   88.3 kg (194 lb 10.7 oz)   Height:        General: Pt is alert, awake, not in acute distress Cardiovascular: RRR, S1/S2 +, no rubs, no gallops Respiratory: CTA bilaterally, no wheezing, no rhonchi Abdominal: Soft, NT, ND, bowel  sounds + Extremities: no edema, no cyanosis    The results of significant diagnostics from this hospitalization (including imaging, microbiology, ancillary and laboratory) are listed below for reference.     Microbiology: Recent Results (from the past 240 hour(s))  Urine culture     Status: Abnormal   Collection Time: 10/17/15  6:19 PM  Result Value Ref Range Status   Specimen Description URINE, RANDOM  Final   Special Requests NONE  Final   Culture >=100,000 COLONIES/mL ESCHERICHIA COLI (A)  Final   Report Status 10/20/2015 FINAL  Final   Organism ID, Bacteria ESCHERICHIA COLI (A)  Final      Susceptibility   Escherichia coli - MIC*    AMPICILLIN 4 SENSITIVE Sensitive     CEFAZOLIN <=4 SENSITIVE Sensitive     CEFTRIAXONE <=1 SENSITIVE Sensitive     CIPROFLOXACIN <=0.25 SENSITIVE Sensitive     GENTAMICIN <=1 SENSITIVE Sensitive     IMIPENEM <=0.25 SENSITIVE Sensitive     NITROFURANTOIN <=16 SENSITIVE Sensitive     TRIMETH/SULFA <=20 SENSITIVE Sensitive     AMPICILLIN/SULBACTAM <=2  SENSITIVE Sensitive     PIP/TAZO <=4 SENSITIVE Sensitive     Extended ESBL NEGATIVE Sensitive     * >=100,000 COLONIES/mL ESCHERICHIA COLI  MRSA PCR Screening     Status: Abnormal   Collection Time: 10/17/15 11:15 PM  Result Value Ref Range Status   MRSA by PCR POSITIVE (A) NEGATIVE Final    Comment:        The GeneXpert MRSA Assay (FDA approved for NASAL specimens only), is one component of a comprehensive MRSA colonization surveillance program. It is not intended to diagnose MRSA infection nor to guide or monitor treatment for MRSA infections. RESULT CALLED TO, READ BACK BY AND VERIFIED WITH: R.CORCORAN,RN 0238 10/18/15 WSHEA   C difficile quick scan w PCR reflex     Status: None   Collection Time: 10/18/15  5:19 PM  Result Value Ref Range Status   C Diff antigen NEGATIVE NEGATIVE Final   C Diff toxin NEGATIVE NEGATIVE Final   C Diff interpretation No C. difficile detected.  Final   Gastrointestinal Panel by PCR , Stool     Status: None   Collection Time: 10/18/15  5:19 PM  Result Value Ref Range Status   Campylobacter species NOT DETECTED NOT DETECTED Final   Plesimonas shigelloides NOT DETECTED NOT DETECTED Final   Salmonella species NOT DETECTED NOT DETECTED Final   Yersinia enterocolitica NOT DETECTED NOT DETECTED Final   Vibrio species NOT DETECTED NOT DETECTED Final   Vibrio cholerae NOT DETECTED NOT DETECTED Final   Enteroaggregative E coli (EAEC) NOT DETECTED NOT DETECTED Final   Enteropathogenic E coli (EPEC) NOT DETECTED NOT DETECTED Final   Enterotoxigenic E coli (ETEC) NOT DETECTED NOT DETECTED Final   Shiga like toxin producing E coli (STEC) NOT DETECTED NOT DETECTED Final   E. coli O157 NOT DETECTED NOT DETECTED Final   Shigella/Enteroinvasive E coli (EIEC) NOT DETECTED NOT DETECTED Final   Cryptosporidium NOT DETECTED NOT DETECTED Final   Cyclospora cayetanensis NOT DETECTED NOT DETECTED Final   Entamoeba histolytica NOT DETECTED NOT DETECTED Final   Giardia lamblia NOT DETECTED NOT DETECTED Final   Adenovirus F40/41 NOT DETECTED NOT DETECTED Final   Astrovirus NOT DETECTED NOT DETECTED Final   Norovirus GI/GII NOT DETECTED NOT DETECTED Final   Rotavirus A NOT DETECTED NOT DETECTED Final   Sapovirus (I, II, IV, and V) NOT DETECTED NOT DETECTED Final     Labs: BNP (last 3 results) No results for input(s): BNP in the last 8760 hours. Basic Metabolic Panel:  Recent Labs Lab 10/17/15 1852 10/17/15 2312 10/18/15 0536 10/19/15 0822 10/20/15 1442 10/21/15 0543  NA 142  --  143 143 139 140  K 4.0  --  3.4* 3.9 3.2* 3.5  CL 109  --  109 110 105 107  CO2 24  --  26 23 25 23   GLUCOSE 169*  --  135* 131* 117* 121*  BUN 29*  --  27* 19 16 14   CREATININE 1.78* 1.66* 1.60* 1.35* 1.28* 1.16  CALCIUM 8.6*  --  8.3* 8.5* 8.0* 8.2*   Liver Function Tests:  Recent Labs Lab 10/17/15 1852 10/18/15 0536  AST 18 18  ALT 11* 12*  ALKPHOS 67 64   BILITOT 0.9 0.8  PROT 7.1 7.1  ALBUMIN 3.8 3.6    Recent Labs Lab 10/17/15 1852  LIPASE 22   No results for input(s): AMMONIA in the last 168 hours. CBC:  Recent Labs Lab 10/17/15 1852 10/17/15 2312 10/18/15 0536 10/19/15 [id number] 10/20/15  1442  WBC 21.2* 17.6* 14.5* 10.9* 11.4*  NEUTROABS 17.5*  --   --   --   --   HGB 14.1 13.4 12.9* 13.8 12.7*  HCT 40.8 40.3 38.9* 40.9 37.9*  MCV 90.1 92.2 90.3 92.3 90.7  PLT 206 193 183 177 210   Cardiac Enzymes: No results for input(s): CKTOTAL, CKMB, CKMBINDEX, TROPONINI in the last 168 hours. BNP: Invalid input(s): POCBNP CBG:  Recent Labs Lab 10/20/15 1211 10/20/15 1628 10/20/15 2157 10/21/15 0754 10/21/15 1207  GLUCAP 128* 109* 130* 115* 140*   D-Dimer No results for input(s): DDIMER in the last 72 hours. Hgb A1c No results for input(s): HGBA1C in the last 72 hours. Lipid Profile No results for input(s): CHOL, HDL, LDLCALC, TRIG, CHOLHDL, LDLDIRECT in the last 72 hours. Thyroid function studies No results for input(s): TSH, T4TOTAL, T3FREE, THYROIDAB in the last 72 hours.  Invalid input(s): FREET3 Anemia work up No results for input(s): VITAMINB12, FOLATE, FERRITIN, TIBC, IRON, RETICCTPCT in the last 72 hours. Urinalysis    Component Value Date/Time   COLORURINE YELLOW 10/17/2015 1819   APPEARANCEUR CLOUDY (A) 10/17/2015 1819   LABSPEC 1.016 10/17/2015 1819   PHURINE 5.5 10/17/2015 1819   GLUCOSEU NEGATIVE 10/17/2015 1819   HGBUR LARGE (A) 10/17/2015 1819   BILIRUBINUR NEGATIVE 10/17/2015 1819   BILIRUBINUR small 08/19/2012 1241   KETONESUR NEGATIVE 10/17/2015 1819   PROTEINUR 30 (A) 10/17/2015 1819   UROBILINOGEN 0.2 08/19/2012 1241   UROBILINOGEN 0.2 03/20/2009 0645   NITRITE POSITIVE (A) 10/17/2015 1819   LEUKOCYTESUR MODERATE (A) 10/17/2015 1819   Sepsis Labs Invalid input(s): PROCALCITONIN,  WBC,  LACTICIDVEN Microbiology Recent Results (from the past 240 hour(s))  Urine culture     Status:  Abnormal   Collection Time: 10/17/15  6:19 PM  Result Value Ref Range Status   Specimen Description URINE, RANDOM  Final   Special Requests NONE  Final   Culture >=100,000 COLONIES/mL ESCHERICHIA COLI (A)  Final   Report Status 10/20/2015 FINAL  Final   Organism ID, Bacteria ESCHERICHIA COLI (A)  Final      Susceptibility   Escherichia coli - MIC*    AMPICILLIN 4 SENSITIVE Sensitive     CEFAZOLIN <=4 SENSITIVE Sensitive     CEFTRIAXONE <=1 SENSITIVE Sensitive     CIPROFLOXACIN <=0.25 SENSITIVE Sensitive     GENTAMICIN <=1 SENSITIVE Sensitive     IMIPENEM <=0.25 SENSITIVE Sensitive     NITROFURANTOIN <=16 SENSITIVE Sensitive     TRIMETH/SULFA <=20 SENSITIVE Sensitive     AMPICILLIN/SULBACTAM <=2 SENSITIVE Sensitive     PIP/TAZO <=4 SENSITIVE Sensitive     Extended ESBL NEGATIVE Sensitive     * >=100,000 COLONIES/mL ESCHERICHIA COLI  MRSA PCR Screening     Status: Abnormal   Collection Time: 10/17/15 11:15 PM  Result Value Ref Range Status   MRSA by PCR POSITIVE (A) NEGATIVE Final    Comment:        The GeneXpert MRSA Assay (FDA approved for NASAL specimens only), is one component of a comprehensive MRSA colonization surveillance program. It is not intended to diagnose MRSA infection nor to guide or monitor treatment for MRSA infections. RESULT CALLED TO, READ BACK BY AND VERIFIED WITH: R.CORCORAN,RN 0238 10/18/15 WSHEA   C difficile quick scan w PCR reflex     Status: None   Collection Time: 10/18/15  5:19 PM  Result Value Ref Range Status   C Diff antigen NEGATIVE NEGATIVE Final   C Diff toxin NEGATIVE  NEGATIVE Final   C Diff interpretation No C. difficile detected.  Final  Gastrointestinal Panel by PCR , Stool     Status: None   Collection Time: 10/18/15  5:19 PM  Result Value Ref Range Status   Campylobacter species NOT DETECTED NOT DETECTED Final   Plesimonas shigelloides NOT DETECTED NOT DETECTED Final   Salmonella species NOT DETECTED NOT DETECTED Final    Yersinia enterocolitica NOT DETECTED NOT DETECTED Final   Vibrio species NOT DETECTED NOT DETECTED Final   Vibrio cholerae NOT DETECTED NOT DETECTED Final   Enteroaggregative E coli (EAEC) NOT DETECTED NOT DETECTED Final   Enteropathogenic E coli (EPEC) NOT DETECTED NOT DETECTED Final   Enterotoxigenic E coli (ETEC) NOT DETECTED NOT DETECTED Final   Shiga like toxin producing E coli (STEC) NOT DETECTED NOT DETECTED Final   E. coli O157 NOT DETECTED NOT DETECTED Final   Shigella/Enteroinvasive E coli (EIEC) NOT DETECTED NOT DETECTED Final   Cryptosporidium NOT DETECTED NOT DETECTED Final   Cyclospora cayetanensis NOT DETECTED NOT DETECTED Final   Entamoeba histolytica NOT DETECTED NOT DETECTED Final   Giardia lamblia NOT DETECTED NOT DETECTED Final   Adenovirus F40/41 NOT DETECTED NOT DETECTED Final   Astrovirus NOT DETECTED NOT DETECTED Final   Norovirus GI/GII NOT DETECTED NOT DETECTED Final   Rotavirus A NOT DETECTED NOT DETECTED Final   Sapovirus (I, II, IV, and V) NOT DETECTED NOT DETECTED Final     Time coordinating discharge: Over 30 minutes  SIGNED:   Debbe Odea, MD  [facility] 10/21/2015, 1:06 PM Pager   If 7PM-7AM, please contact night-coverage [email or web address] Password [email or web address]

## 2015-10-21 NOTE — Care Management Note (Signed)
Case Management Note  Patient Details  Name: MABEL ARNS MRN: [id number] Date of Birth: [birth year]  Subjective/Objective:                   Diarrhea and abdominal discomfort  Action/Plan: CM spoke with patient's wife via telephone. She selected [facility] for patient's HHPT, RN and HHA's. Santiago Glad at [facility] notified of the referral and discharge scheduled for today.   Expected Discharge Date:   10/21/15               Expected Discharge Plan:  [place]  In-House Referral:     Discharge planning Services  CM Consult  Post Acute Care Choice:  Home Health Choice offered to:  Spouse  DME Arranged:  N/A DME Agency:  NA  HH Arranged:  RN, PT, Nurse's Aide [place] Agency:  [place]  Status of Service:  Completed, signed off  If discussed at [place] of Stay Meetings, dates discussed:    Additional Comments:  Apolonio Schneiders, RN 10/21/2015, 3:23 PM

## 2015-10-21 NOTE — Care Management Important Message (Signed)
Important Message  Patient Details  Name: Jon Davis MRN: [id number] Date of Birth: [birth year]   Medicare Important Message Given:  Yes    Apolonio Schneiders, RN 10/21/2015, 3:22 PM

## 2015-10-22 ENCOUNTER — Emergency Department (HOSPITAL_COMMUNITY)
Admission: EM | Admit: 2015-10-22 | Discharge: 2015-10-23 | Disposition: A | Discharge status: Home / Self Care | Payer: Medicare Other | Source: Home / Self Care | Attending: Emergency Medicine | Admitting: Emergency Medicine

## 2015-10-22 DIAGNOSIS — T839XXA Unspecified complication of genitourinary prosthetic device, implant and graft, initial encounter: Secondary | ICD-10-CM

## 2015-10-22 DIAGNOSIS — E119 Type 2 diabetes mellitus without complications: Secondary | ICD-10-CM

## 2015-10-22 DIAGNOSIS — Z87891 Personal history of nicotine dependence: Secondary | ICD-10-CM

## 2015-10-22 DIAGNOSIS — Y732 Prosthetic and other implants, materials and accessory gastroenterology and urology devices associated with adverse incidents: Secondary | ICD-10-CM | POA: Insufficient documentation

## 2015-10-22 DIAGNOSIS — T82898A Other specified complication of vascular prosthetic devices, implants and grafts, initial encounter: Secondary | ICD-10-CM | POA: Diagnosis not present

## 2015-10-22 DIAGNOSIS — T83498A Other mechanical complication of other prosthetic devices, implants and grafts of genital tract, initial encounter: Secondary | ICD-10-CM | POA: Diagnosis not present

## 2015-10-22 DIAGNOSIS — Z79899 Other long term (current) drug therapy: Secondary | ICD-10-CM | POA: Insufficient documentation

## 2015-10-22 DIAGNOSIS — Z7982 Long term (current) use of aspirin: Secondary | ICD-10-CM

## 2015-10-22 DIAGNOSIS — I2510 Atherosclerotic heart disease of native coronary artery without angina pectoris: Secondary | ICD-10-CM | POA: Insufficient documentation

## 2015-10-22 DIAGNOSIS — N390 Urinary tract infection, site not specified: Secondary | ICD-10-CM | POA: Diagnosis not present

## 2015-10-22 DIAGNOSIS — Z950 Presence of cardiac pacemaker: Secondary | ICD-10-CM

## 2015-10-22 DIAGNOSIS — T83098A Other mechanical complication of other indwelling urethral catheter, initial encounter: Secondary | ICD-10-CM | POA: Insufficient documentation

## 2015-10-22 DIAGNOSIS — G309 Alzheimer's disease, unspecified: Secondary | ICD-10-CM | POA: Insufficient documentation

## 2015-10-22 DIAGNOSIS — Z7984 Long term (current) use of oral hypoglycemic drugs: Secondary | ICD-10-CM | POA: Insufficient documentation

## 2015-10-22 DIAGNOSIS — I10 Essential (primary) hypertension: Secondary | ICD-10-CM

## 2015-10-22 DIAGNOSIS — R338 Other retention of urine: Secondary | ICD-10-CM | POA: Diagnosis not present

## 2015-10-22 DIAGNOSIS — N471 Phimosis: Secondary | ICD-10-CM | POA: Diagnosis not present

## 2015-10-22 NOTE — Care Management Note (Signed)
Case Management Note  Patient Details  Name: KEELIN ALBRACHT MRN: [id number] Date of Birth: [birth year]  Subjective/Objective: Received call from [facility] rep Cheryl-stating they were active w/patient. Informed amedysis rep-patient has already been d/c if she could contact [place] to explain the issue to-She voiced understanding.                  Action/Plan:   Expected Discharge Date:                  Expected Discharge Plan:  [place]  In-House Referral:     Discharge planning Services  CM Consult  Post Acute Care Choice:  Home Health Choice offered to:  Spouse  DME Arranged:  N/A DME Agency:  NA  HH Arranged:  RN, PT, Nurse's Aide [place] Agency:  [facility]  Status of Service:  Completed, signed off  If discussed at [place] of Stay Meetings, dates discussed:    Additional Comments:  Dessa Phi, RN 10/22/2015, 11:42 AM

## 2015-10-22 NOTE — ED Provider Notes (Signed)
[facility] DEPT Provider Note   CSN: [id number] Arrival date & time: 10/22/15  2038  By signing my name below, I, Ephriam Jenkins, attest that this documentation has been prepared under the direction and in the presence of [employer].  Electronically Signed: Ephriam Jenkins, ED Scribe. 10/22/15. 9:38 PM.   History     HPI: LEVEL 5 CAVEAT SECONDARY TO DEMENTIA HPI Comments: Jon Davis is a 82 y.o. Male with a PMHx of dementia, brought in by ambulance, who presents to the Emergency Department for problems with his foley catheter today. He is here from his living facility, [place], due to problem with his foley catheter. Pt denies any pain currently but states that "I feel confused. I don't really understand why I'm here." Pt was here yesterday and dx with diverticulitis, acute renal failure and had urinary retention. Foley catheter was placed and has urology follow up this week. No abdominal pain.   The history is provided by the patient and the EMS personnel. No language interpreter was used.   Past Medical History:  Diagnosis Date  . Anemia 06/2012  . Atrial fibrillation ([place])   . BPH (benign prostatic hyperplasia)    Followed by Dr. Matilde Sprang  . Brady-tachy syndrome ([place])    04/17/2009 MDT pacer Enrhythmatr fib  . CAD (coronary artery disease)   . Dementia in Alzheimer's disease   . Depression   . Diabetes ([place])   . Diverticulosis 02/26/2010   Sigmoid colon  . Duodenitis 02/26/2010  . Elevated PSA   . Episodic VTach, pacer    Followed by Dr. Avon Gully  . Fall 03/2014   elbow fluid   . Gastritis 02/26/2010   moderate  . Hiatal hernia 02/26/2010  . Hyperlipidemia   . Hypertension   . Memory loss   . Microalbuminuria   . Urinary incontinence     Patient Active Problem List   Diagnosis Date Noted  . UTI (urinary tract infection) 10/21/2015  . Urinary retention 10/21/2015  . Sigmoid diverticulitis 10/17/2015  . Dementia 10/17/2015  . ARF (acute  renal failure) ([place]) 10/17/2015  . Diarrhea 10/17/2015  . AKI (acute kidney injury) ([place])   . Cerebral degeneration, unspecified 05/26/2013  . Restless legs syndrome (RLS) 05/26/2013  . CAD (coronary artery disease) 07/31/2012  . Pacemaker 07/31/2012  . Nonsustained ventricular tachycardia ([place]) 07/31/2012  . Aortic atherosclerosis ([place]) 07/31/2012  . Microcytic anemia 06/29/2012  . Long term (current) use of anticoagulants 04/26/2012  . THYROID NODULE 01/23/2010  . Type 2 diabetes mellitus with vascular disease ([place]) 01/23/2010  . Hyperlipidemia 01/23/2010  . ANEMIA, IRON DEFICIENCY 01/23/2010  . Memory loss 01/23/2010  . Essential hypertension 01/23/2010  . Atrial fibrillation ([place]) 01/23/2010  . CVA 01/23/2010  . ESOPHAGEAL STRICTURE 01/23/2010  . HIATAL HERNIA 01/23/2010  . DIVERTICULOSIS, COLON 01/23/2010  . HEMOCCULT POSITIVE STOOL 01/23/2010    Past Surgical History:  Procedure Laterality Date  . APPENDECTOMY  1948  . CARDIAC CATHETERIZATION  07/07/2007   mild to mod. ca+ w/narrowing of 20% ostium of second diagonal,diffuse 80% stenosis small caliber infer. branch,20% mid LAD,60-70% narrowing mid atrioventricular groove CX,mild 20-30% RCA  . CAROTID DOPPLER  10/01/2006   0-49% right bulb,prox,ECA & prox ICA,0-49% left subclavian bulb & prox ICA,right & left ICA abn. resistance  . Cataracts    . CHOLECYSTECTOMY    . NM MYOVIEW LTD  10/01/2010   No ischemia  . PERMANENT PACEMAKER INSERTION  04/17/2009   MDT Enrhythm  Home Medications    Prior to Admission medications   Medication Sig Start Date End Date Taking? Authorizing Provider  ALPRAZolam (XANAX) 0.25 MG tablet Take 0.25 mg by mouth 2 (two) times daily.     Historical Provider, MD  amLODipine (NORVASC) 5 MG tablet Take 2.5 mg by mouth daily.     Historical Provider, MD  aspirin 81 MG tablet Take 81 mg by mouth daily.    Historical Provider, MD  atorvastatin (LIPITOR) 20 MG tablet Take 20 mg by mouth every  evening.     Historical Provider, MD  cefUROXime (CEFTIN) 500 MG tablet Take 1 tablet (500 mg total) by mouth 2 (two) times daily with a meal. 10/21/15   Debbe Odea, MD  donepezil (ARICEPT) 23 MG TABS tablet Take 1 tablet (23 mg total) by mouth at bedtime. 02/21/15   Dennie Bible, NP  ferrous sulfate 325 (65 FE) MG tablet Take 325 mg by mouth 2 (two) times daily.    Historical Provider, MD  glipiZIDE (GLUCOTROL) 10 MG tablet Take 10 mg by mouth daily. 04/21/14   Historical Provider, MD  HYDROcodone-acetaminophen (NORCO/VICODIN) 5-325 MG per tablet Take 1 tablet by mouth 2 (two) times daily.    Historical Provider, MD  isosorbide mononitrate (IMDUR) 30 MG 24 hr tablet Take 30 mg by mouth daily.    Historical Provider, MD  memantine (NAMENDA) 10 MG tablet Take 10 mg by mouth 2 (two) times daily.    Historical Provider, MD  metoprolol succinate (TOPROL-XL) 25 MG 24 hr tablet Take 1 tablet (25 mg total) by mouth daily. Take with or immediately following a meal. 09/13/15   Mihai Croitoru, MD  metroNIDAZOLE (FLAGYL) 500 MG tablet Take 1 tablet (500 mg total) by mouth 3 (three) times daily. 10/21/15   Debbe Odea, MD  pantoprazole (PROTONIX) 40 MG tablet Take 40 mg by mouth daily.    Historical Provider, MD  pramipexole (MIRAPEX) 0.125 MG tablet Take 2 tablets (0.25 mg total) by mouth every evening. Two hours before bedtime 06/29/14   Dennie Bible, NP  sertraline (ZOLOFT) 50 MG tablet Take 50 mg by mouth daily.    Historical Provider, MD  tamsulosin (FLOMAX) 0.4 MG CAPS capsule Take 1 capsule (0.4 mg total) by mouth daily. 10/21/15   Debbe Odea, MD    Family History Family History  Problem Relation Age of Onset  . Alzheimer's disease Mother     Deceased  . Heart disease Father     Deceased    Social History Social History  Substance Use Topics  . Smoking status: Former Research scientist (life sciences)  . Smokeless tobacco: Former Systems developer    Quit date: 02/09/1974  . Alcohol use No    Allergies     Penicillins   Review of Systems Review of Systems  Unable to perform ROS: Dementia    Physical Exam Updated Vital Signs BP 145/66   Pulse 74   Temp 98.6 F (37 C) (Oral)   Resp 16   SpO2 96%   Physical Exam  Constitutional: He is oriented to person, place, and time. He appears well-developed and well-nourished. No distress.  HENT:  Head: Normocephalic and atraumatic.  Neck: Normal range of motion.  Pulmonary/Chest: Effort normal.  Abdominal: Soft.  Genitourinary:  Genitourinary Comments: Catheter already removed. Very minimal tenderness to bladder. Remainder of abdomen soft and non-tender.  Neurological: He is alert and oriented to person, place, and time.  Skin: Skin is warm and dry. He is not diaphoretic.  Psychiatric:  He has a normal mood and affect. Judgment normal.  Nursing note and vitals reviewed.    ED Treatments / Results  DIAGNOSTIC STUDIES: Oxygen Saturation is 96% on RA, normal by my interpretation.  COORDINATION OF CARE: 9:22 PM-Will place catheter. Discussed treatment plan with pt at bedside and pt agreed to plan.   Labs (all labs ordered are listed, but only abnormal results are displayed) Labs Reviewed - No data to display  EKG  EKG Interpretation None       Radiology No results found.  Procedures Procedures (including critical care time)  Medications Ordered in ED Medications - No data to display   Initial Impression / Assessment and Plan / ED Course  I have reviewed the triage vital signs and the nursing notes.  Pertinent labs & imaging results that were available during my care of the patient were reviewed by me and considered in my medical decision making (see chart for details).  Clinical Course     Final Clinical Impressions(s) / ED Diagnoses   Final diagnoses:  Foley catheter problem, initial encounter [facility])    Labs:  Imaging:  Consults:  Therapeutics:  Discharge Meds:   Assessment/Plan:  82 year old male  with history of dementia presents today with catheter related issues. Catheter was replaced here, draining appropriately. Patients afebrile nontoxic discharged to care facility with instructions to follow-up with urology as previously indicated.    New Prescriptions Discharge Medication List as of 10/23/2015 12:04 AM    I personally performed the services described in this documentation, which was scribed in my presence. The recorded information has been reviewed and is accurate.     Okey Regal, PA-C 10/23/15 [id number]    April Palumbo, MD 10/23/15 [id number]

## 2015-10-22 NOTE — ED Notes (Addendum)
Per EMS- pt from [facility]. Hx of dementia, catheter placed in yesterday for urinary retention/UTI. Catheter severed above balloon port.

## 2015-10-23 ENCOUNTER — Encounter: Payer: Self-pay | Admitting: Cardiovascular Disease

## 2015-10-23 ENCOUNTER — Encounter (HOSPITAL_COMMUNITY): Payer: Self-pay | Admitting: Emergency Medicine

## 2015-10-23 ENCOUNTER — Telehealth: Payer: Self-pay | Admitting: Cardiovascular Disease

## 2015-10-23 ENCOUNTER — Emergency Department (HOSPITAL_COMMUNITY)
Admission: EM | Admit: 2015-10-23 | Discharge: 2015-10-23 | Disposition: A | Discharge status: Home / Self Care | Payer: Medicare Other | Attending: Emergency Medicine | Admitting: Emergency Medicine

## 2015-10-23 DIAGNOSIS — Z950 Presence of cardiac pacemaker: Secondary | ICD-10-CM | POA: Insufficient documentation

## 2015-10-23 DIAGNOSIS — I2510 Atherosclerotic heart disease of native coronary artery without angina pectoris: Secondary | ICD-10-CM | POA: Insufficient documentation

## 2015-10-23 DIAGNOSIS — I10 Essential (primary) hypertension: Secondary | ICD-10-CM | POA: Insufficient documentation

## 2015-10-23 DIAGNOSIS — Y732 Prosthetic and other implants, materials and accessory gastroenterology and urology devices associated with adverse incidents: Secondary | ICD-10-CM | POA: Insufficient documentation

## 2015-10-23 DIAGNOSIS — R100 Acute abdomen: Secondary | ICD-10-CM | POA: Diagnosis not present

## 2015-10-23 DIAGNOSIS — T839XXA Unspecified complication of genitourinary prosthetic device, implant and graft, initial encounter: Secondary | ICD-10-CM

## 2015-10-23 DIAGNOSIS — Z7984 Long term (current) use of oral hypoglycemic drugs: Secondary | ICD-10-CM | POA: Insufficient documentation

## 2015-10-23 DIAGNOSIS — R531 Weakness: Secondary | ICD-10-CM | POA: Diagnosis not present

## 2015-10-23 DIAGNOSIS — Z79899 Other long term (current) drug therapy: Secondary | ICD-10-CM | POA: Insufficient documentation

## 2015-10-23 DIAGNOSIS — E119 Type 2 diabetes mellitus without complications: Secondary | ICD-10-CM | POA: Insufficient documentation

## 2015-10-23 DIAGNOSIS — T83098A Other mechanical complication of other indwelling urethral catheter, initial encounter: Secondary | ICD-10-CM | POA: Diagnosis not present

## 2015-10-23 DIAGNOSIS — T83198A Other mechanical complication of other urinary devices and implants, initial encounter: Secondary | ICD-10-CM | POA: Diagnosis not present

## 2015-10-23 DIAGNOSIS — G309 Alzheimer's disease, unspecified: Secondary | ICD-10-CM | POA: Insufficient documentation

## 2015-10-23 DIAGNOSIS — T83498A Other mechanical complication of other prosthetic devices, implants and grafts of genital tract, initial encounter: Secondary | ICD-10-CM | POA: Diagnosis not present

## 2015-10-23 DIAGNOSIS — Z7982 Long term (current) use of aspirin: Secondary | ICD-10-CM | POA: Insufficient documentation

## 2015-10-23 DIAGNOSIS — Z87891 Personal history of nicotine dependence: Secondary | ICD-10-CM | POA: Insufficient documentation

## 2015-10-23 NOTE — Progress Notes (Signed)
ED CM spoke with Glade Lloyd at [facility] to review pt and wife concerns with wanting options per wife permission Seth Bake to contact pt and wife today and to plan to met with them as soon as possible

## 2015-10-23 NOTE — ED Notes (Signed)
Social worker at bedside.

## 2015-10-23 NOTE — Progress Notes (Signed)
CSW received call from EDP regarding placement for patient. He states patient cut his catheter with scissors and his wife cut her toe attempting to cut her toenails.   CSW spoke with patients wife, Jon Davis regarding patient, as he was asleep and per Nurse note, patient has hx of dementia. She stated she and the patient have both been residing at [facility]. She stated she could not give an answer regarding patient going into a facility because she states she was not aware of anything regarding a facility. She stated she would like for her and the patient to be together. She stated her son, Jon Davis is Medical POA [phone] and the grandson is Financial POA. CSW informed the wife of the uncertainty of placement at this time and that the family would usually assist with placement.   CSW spoke with [name], friend of the family who states she assist with care for the patient and his wife. She stated the patient and his wife are both Jehovah's Witness and the Jehovah's Witness congregation would like for them bot the be placed at [facility] or [facility] and [place]. CSW informed her that information could be provided to the family regarding placement. She stated she had been staying with the patient and wife since 10/21/15. She stated they had both been residing at the [facility] for one year. She stated she will stay with patient and wife "until whatever happens".   CSW staffed Asst. Director of Social Work. CSW spoke with patient's wife to inform her if patient were to go into a facility, patient would have to be private pay. CSW informed her that SNF would cost $6500 to $8000 for private pay and the and ALF would cheaper than this amount. CSW informed her that nothing would be covered by Medicare. While in the room, CSW spoke with [name], CNA with [facility]. She stated she goes  to the home on Tuesday and Thursday from 10 to 3. She also noted that someone from their agency goes to the home everyday for one hour in the AM and PM to ensure they are receiving their medications.   CSW staffed with EDP to inform him that patient would have to private pay to go into a facility. CSW also staffed with ED CM regarding possible additional home health. ED CM states she will see the patient. He stated he would speak with the patient. Nurse updated as well.   Nurse stated patient was being discharged. CSW provided patient's wife with SNF and ALF resources if needed for the family.   [name] [id number] ED CSW 10/23/2015 1:03 PM

## 2015-10-23 NOTE — Telephone Encounter (Signed)
New message    Request for surgical clearance:  1. What type of surgery is being performed? TURP , circumcision  2. When is this surgery scheduled? Pending   3. Are there any medications that need to be held prior to surgery and how long? plavix    4. Name of physician performing surgery? Dr. Jeffie Pollock   5. What is your office phone and fax number? [phone] /  fax [phone]

## 2015-10-23 NOTE — Discharge Instructions (Signed)
Please follow-up with urologist is previously scheduled. Return to the emergency room immediately if any new or worsening signs or symptoms present.

## 2015-10-23 NOTE — Progress Notes (Signed)
ED CM consulted by ED SW after consulted by EDP, Regenia Skeeter for potential placement for pt  This pt accidentally cut his foley with scissors x 2 in the last 24 hours Pt's foley was replaced in [facility] ED by ED RN  Pt and he wife (who was also a pt earlier in [facility] ED #12- accidentally cut toe with scissors) seen by ED CM  Cm verified with pt and wife their choice: to return to [place] with present services of well care (margaret NT from well care is in pt room)  Wife states Dr Osborne Casco has discussed with her and pt the need to change levels of care from [place] to possible an ALF or a SNF.   [facility] services initiated by pcp and pt Joycelyn Schmid confirms pt and wife are seen by NT, and Rn for medications.  If extra services needed this can be initiated by pcp [place] Also pt and wife are accompanied by a male family friend who advocated for the son (not present and does not live locally) Pt's wife states she will updated the son  Wife gave permission for ED Cm also to contact SW at [facility] to initiate a call to them to share options for community services/living options ED CM encouraged pt to be aware of his scissor use  ED CM sent Dr Osborne Casco a message via EPIC in basket per wife permission:  Dr Osborne Casco  Mr and Mrs Lisonbee has visits to [facility] ED this am  The EDP spoke with the ED Social worker about placement for them but the pt and wife are not voicing that they are ready for placement Wanting to continue with [facility] services They were offered information on a resource called [facility] to assist with discussing options for elder living  Mrs Shehee gave me permission to notify you and she wants to have a conversation with their son and speak with staff of [facility] to get their options

## 2015-10-23 NOTE — ED Notes (Signed)
Pt and wife live alone with no help . Social worker consult in place for possible placement. Pt is obviously unable to take care of himself . Pt has Hx dementia.

## 2015-10-23 NOTE — Telephone Encounter (Signed)
Surgical clearance routed to MD Croitoru.

## 2015-10-23 NOTE — ED Provider Notes (Signed)
[facility] DEPT Provider Note   CSN: [id number] Arrival date & time: 10/23/15  [id number]     History   Chief Complaint Chief Complaint  Patient presents with  . Other    Foley Catheter Problem    HPI CRIXUS HOUT is a 82 y.o. male presenting to the ER after cutting his Foley catheter with scissors. This is the second time tonight but he has presented to the ER for this. His wife also is in the ER after she accidentally cut her toe while cutting her toenails with scissors. The wife is currently at the bedside. She states that she found his Foley catheter after he had cut it. She did not see him cut it. Patient currently has no complaints. Was recently admitted and discharged for UTI, diverticulitis, and acute renal failure. Has been acting his baseline otherwise. Patient and wife live in a retirement home.  HPI  Past Medical History:  Diagnosis Date  . Anemia 06/2012  . Atrial fibrillation ([place])   . BPH (benign prostatic hyperplasia)    Followed by Dr. Matilde Sprang  . Brady-tachy syndrome ([place])    04/17/2009 MDT pacer Enrhythmatr fib  . CAD (coronary artery disease)   . Dementia in Alzheimer's disease   . Depression   . Diabetes ([place])   . Diverticulosis 02/26/2010   Sigmoid colon  . Duodenitis 02/26/2010  . Elevated PSA   . Episodic VTach, pacer    Followed by Dr. Avon Gully  . Fall 03/2014   elbow fluid   . Gastritis 02/26/2010   moderate  . Hiatal hernia 02/26/2010  . Hyperlipidemia   . Hypertension   . Memory loss   . Microalbuminuria   . Urinary incontinence     Patient Active Problem List   Diagnosis Date Noted  . UTI (urinary tract infection) 10/21/2015  . Urinary retention 10/21/2015  . Sigmoid diverticulitis 10/17/2015  . Dementia 10/17/2015  . ARF (acute renal failure) ([place]) 10/17/2015  . Diarrhea 10/17/2015  . AKI (acute kidney injury) ([place])   . Cerebral degeneration, unspecified 05/26/2013  . Restless legs syndrome (RLS) 05/26/2013  . CAD (coronary  artery disease) 07/31/2012  . Pacemaker 07/31/2012  . Nonsustained ventricular tachycardia ([place]) 07/31/2012  . Aortic atherosclerosis ([place]) 07/31/2012  . Microcytic anemia 06/29/2012  . Long term (current) use of anticoagulants 04/26/2012  . THYROID NODULE 01/23/2010  . Type 2 diabetes mellitus with vascular disease ([place]) 01/23/2010  . Hyperlipidemia 01/23/2010  . ANEMIA, IRON DEFICIENCY 01/23/2010  . Memory loss 01/23/2010  . Essential hypertension 01/23/2010  . Atrial fibrillation ([place]) 01/23/2010  . CVA 01/23/2010  . ESOPHAGEAL STRICTURE 01/23/2010  . HIATAL HERNIA 01/23/2010  . DIVERTICULOSIS, COLON 01/23/2010  . HEMOCCULT POSITIVE STOOL 01/23/2010    Past Surgical History:  Procedure Laterality Date  . APPENDECTOMY  1948  . CARDIAC CATHETERIZATION  07/07/2007   mild to mod. ca+ w/narrowing of 20% ostium of second diagonal,diffuse 80% stenosis small caliber infer. branch,20% mid LAD,60-70% narrowing mid atrioventricular groove CX,mild 20-30% RCA  . CAROTID DOPPLER  10/01/2006   0-49% right bulb,prox,ECA & prox ICA,0-49% left subclavian bulb & prox ICA,right & left ICA abn. resistance  . Cataracts    . CHOLECYSTECTOMY    . NM MYOVIEW LTD  10/01/2010   No ischemia  . PERMANENT PACEMAKER INSERTION  04/17/2009   MDT Enrhythm       Home Medications    Prior to Admission medications   Medication Sig Start Date End Date Taking? Authorizing Provider  ALPRAZolam (  XANAX) 0.25 MG tablet Take 0.25 mg by mouth 2 (two) times daily.     Historical Provider, MD  amLODipine (NORVASC) 5 MG tablet Take 2.5 mg by mouth daily.     Historical Provider, MD  aspirin 81 MG tablet Take 81 mg by mouth daily.    Historical Provider, MD  atorvastatin (LIPITOR) 20 MG tablet Take 20 mg by mouth every evening.     Historical Provider, MD  cefUROXime (CEFTIN) 500 MG tablet Take 1 tablet (500 mg total) by mouth 2 (two) times daily with a meal. 10/21/15   Debbe Odea, MD  donepezil (ARICEPT) 23 MG TABS  tablet Take 1 tablet (23 mg total) by mouth at bedtime. 02/21/15   Dennie Bible, NP  ferrous sulfate 325 (65 FE) MG tablet Take 325 mg by mouth 2 (two) times daily.    Historical Provider, MD  glipiZIDE (GLUCOTROL) 10 MG tablet Take 10 mg by mouth daily. 04/21/14   Historical Provider, MD  HYDROcodone-acetaminophen (NORCO/VICODIN) 5-325 MG per tablet Take 1 tablet by mouth 2 (two) times daily.    Historical Provider, MD  isosorbide mononitrate (IMDUR) 30 MG 24 hr tablet Take 30 mg by mouth daily.    Historical Provider, MD  memantine (NAMENDA) 10 MG tablet Take 10 mg by mouth 2 (two) times daily.    Historical Provider, MD  metoprolol succinate (TOPROL-XL) 25 MG 24 hr tablet Take 1 tablet (25 mg total) by mouth daily. Take with or immediately following a meal. 09/13/15   Mihai Croitoru, MD  metroNIDAZOLE (FLAGYL) 500 MG tablet Take 1 tablet (500 mg total) by mouth 3 (three) times daily. 10/21/15   Debbe Odea, MD  pantoprazole (PROTONIX) 40 MG tablet Take 40 mg by mouth daily.    Historical Provider, MD  pramipexole (MIRAPEX) 0.125 MG tablet Take 2 tablets (0.25 mg total) by mouth every evening. Two hours before bedtime 06/29/14   Dennie Bible, NP  sertraline (ZOLOFT) 50 MG tablet Take 50 mg by mouth daily.    Historical Provider, MD  tamsulosin (FLOMAX) 0.4 MG CAPS capsule Take 1 capsule (0.4 mg total) by mouth daily. 10/21/15   Debbe Odea, MD    Family History Family History  Problem Relation Age of Onset  . Alzheimer's disease Mother     Deceased  . Heart disease Father     Deceased    Social History Social History  Substance Use Topics  . Smoking status: Former Research scientist (life sciences)  . Smokeless tobacco: Former Systems developer    Quit date: 02/09/1974  . Alcohol use No     Allergies   Penicillins   Review of Systems Review of Systems  Unable to perform ROS: Dementia     Physical Exam Updated Vital Signs BP 161/69 (BP Location: Right Arm)   Pulse 69   Temp 98.6 F (37 C) (Oral)    Resp 18   SpO2 99%   Physical Exam  Constitutional: He appears well-developed and well-nourished.  HENT:  Head: Normocephalic and atraumatic.  Right Ear: External ear normal.  Left Ear: External ear normal.  Nose: Nose normal.  Eyes: Right eye exhibits no discharge. Left eye exhibits no discharge.  Neck: Neck supple.  Cardiovascular: Normal rate, regular rhythm and normal heart sounds.   Pulmonary/Chest: Effort normal and breath sounds normal.  Abdominal: Soft. There is no tenderness.  Genitourinary:  Genitourinary Comments: Foley catheter in place. No obvious penile injury, redness or swelling  Musculoskeletal: He exhibits no edema.  Neurological: He is  alert. He is disoriented.  Skin: Skin is warm and dry.  Nursing note and vitals reviewed.    ED Treatments / Results  Labs (all labs ordered are listed, but only abnormal results are displayed) Labs Reviewed - No data to display  EKG  EKG Interpretation None       Radiology No results found.  Procedures Procedures (including critical care time)  Medications Ordered in ED Medications - No data to display   Initial Impression / Assessment and Plan / ED Course  I have reviewed the triage vital signs and the nursing notes.  Pertinent labs & imaging results that were available during my care of the patient were reviewed by me and considered in my medical decision making (see chart for details).  Clinical Course  Comment By Time  Foley catheter replaced prior to me seeing patient. Otherwise vitals unremarkable besides HTN. No indication for further workup. However, will consult SW given he may not be suitable to be living in house with wife only at this time. Tried to call grandson but no answer. Sherwood Gambler, MD 09/12 [phone]    SW has talked to wife/family. Will be out of pocket to do NH, family to decide. Stable for outpatient referral. As for foley he needs this due to stricture, discussed removing objects from  the house such as knives/scissors so he can't cut his foley again. Call PCP for outpatient help with care in the home.  Final Clinical Impressions(s) / ED Diagnoses   Final diagnoses:  Foley catheter problem, initial encounter [facility])    New Prescriptions Discharge Medication List as of 10/23/2015 11:39 AM       Sherwood Gambler, MD 10/23/15 1523

## 2015-10-23 NOTE — ED Notes (Signed)
Bed: WA09 Expected date:  Expected time:  Means of arrival:  Comments: 82 yo M  Catheter problem

## 2015-10-23 NOTE — Telephone Encounter (Signed)
Sent via Epic

## 2015-10-23 NOTE — ED Triage Notes (Signed)
Patient brought in by EMS after cutting catheter with scissors. Second time today.

## 2015-10-24 ENCOUNTER — Telehealth: Payer: Self-pay | Admitting: Cardiovascular Disease

## 2015-10-24 DIAGNOSIS — B079 Viral wart, unspecified: Secondary | ICD-10-CM | POA: Diagnosis not present

## 2015-10-24 DIAGNOSIS — D485 Neoplasm of uncertain behavior of skin: Secondary | ICD-10-CM | POA: Diagnosis not present

## 2015-10-24 DIAGNOSIS — L814 Other melanin hyperpigmentation: Secondary | ICD-10-CM | POA: Diagnosis not present

## 2015-10-24 DIAGNOSIS — Z85828 Personal history of other malignant neoplasm of skin: Secondary | ICD-10-CM | POA: Diagnosis not present

## 2015-10-24 DIAGNOSIS — L309 Dermatitis, unspecified: Secondary | ICD-10-CM | POA: Diagnosis not present

## 2015-10-24 DIAGNOSIS — L821 Other seborrheic keratosis: Secondary | ICD-10-CM | POA: Diagnosis not present

## 2015-10-24 DIAGNOSIS — D1801 Hemangioma of skin and subcutaneous tissue: Secondary | ICD-10-CM | POA: Diagnosis not present

## 2015-10-24 NOTE — Telephone Encounter (Signed)
Received records from [facility] for appointment on 11/13/15 with Dr Sallyanne Kuster.  Records given to [facility] (medical records) for Dr Croitoru's schedule on 11/13/15. lp

## 2015-10-25 ENCOUNTER — Other Ambulatory Visit: Payer: Self-pay | Admitting: Urology

## 2015-10-25 ENCOUNTER — Encounter (HOSPITAL_COMMUNITY): Payer: Self-pay | Admitting: Emergency Medicine

## 2015-10-25 ENCOUNTER — Emergency Department (HOSPITAL_COMMUNITY)
Admission: EM | Admit: 2015-10-25 | Discharge: 2015-10-25 | Disposition: A | Discharge status: Home / Self Care | Payer: Medicare Other | Source: Home / Self Care | Attending: Emergency Medicine | Admitting: Emergency Medicine

## 2015-10-25 DIAGNOSIS — T839XXA Unspecified complication of genitourinary prosthetic device, implant and graft, initial encounter: Secondary | ICD-10-CM

## 2015-10-25 DIAGNOSIS — Z87891 Personal history of nicotine dependence: Secondary | ICD-10-CM | POA: Insufficient documentation

## 2015-10-25 DIAGNOSIS — R339 Retention of urine, unspecified: Secondary | ICD-10-CM | POA: Diagnosis not present

## 2015-10-25 DIAGNOSIS — I10 Essential (primary) hypertension: Secondary | ICD-10-CM | POA: Insufficient documentation

## 2015-10-25 DIAGNOSIS — Z7982 Long term (current) use of aspirin: Secondary | ICD-10-CM | POA: Insufficient documentation

## 2015-10-25 DIAGNOSIS — E119 Type 2 diabetes mellitus without complications: Secondary | ICD-10-CM

## 2015-10-25 DIAGNOSIS — Y733 Surgical instruments, materials and gastroenterology and urology devices (including sutures) associated with adverse incidents: Secondary | ICD-10-CM | POA: Insufficient documentation

## 2015-10-25 DIAGNOSIS — I2510 Atherosclerotic heart disease of native coronary artery without angina pectoris: Secondary | ICD-10-CM | POA: Insufficient documentation

## 2015-10-25 DIAGNOSIS — Z79899 Other long term (current) drug therapy: Secondary | ICD-10-CM

## 2015-10-25 DIAGNOSIS — G309 Alzheimer's disease, unspecified: Secondary | ICD-10-CM

## 2015-10-25 DIAGNOSIS — T83021A Displacement of indwelling urethral catheter, initial encounter: Secondary | ICD-10-CM | POA: Insufficient documentation

## 2015-10-25 DIAGNOSIS — T83011A Breakdown (mechanical) of indwelling urethral catheter, initial encounter: Secondary | ICD-10-CM | POA: Diagnosis not present

## 2015-10-25 LAB — CBC WITH DIFFERENTIAL/PLATELET
Basophils Absolute: 0 10*3/uL (ref 0.0–0.1)
Basophils Relative: 0 %
EOS PCT: 4 %
Eosinophils Absolute: 0.4 10*3/uL (ref 0.0–0.7)
HCT: 39.5 % (ref 39.0–52.0)
HEMOGLOBIN: 14 g/dL (ref 13.0–17.0)
LYMPHS ABS: 1.7 10*3/uL (ref 0.7–4.0)
LYMPHS PCT: 16 %
MCH: 30.9 pg (ref 26.0–34.0)
MCHC: 35.4 g/dL (ref 30.0–36.0)
MCV: 87.2 fL (ref 78.0–100.0)
MONO ABS: 1.4 10*3/uL — AB (ref 0.1–1.0)
Monocytes Relative: 13 %
Neutro Abs: 6.7 10*3/uL (ref 1.7–7.7)
Neutrophils Relative %: 67 %
Platelets: 260 10*3/uL (ref 150–400)
RBC: 4.53 MIL/uL (ref 4.22–5.81)
RDW: 13.3 % (ref 11.5–15.5)
WBC: 10.2 10*3/uL (ref 4.0–10.5)

## 2015-10-25 LAB — URINE MICROSCOPIC-ADD ON

## 2015-10-25 LAB — URINALYSIS, ROUTINE W REFLEX MICROSCOPIC
Bilirubin Urine: NEGATIVE
GLUCOSE, UA: NEGATIVE mg/dL
Ketones, ur: NEGATIVE mg/dL
Nitrite: POSITIVE — AB
PH: 6 (ref 5.0–8.0)
Protein, ur: NEGATIVE mg/dL
SPECIFIC GRAVITY, URINE: 1.018 (ref 1.005–1.030)

## 2015-10-25 LAB — BASIC METABOLIC PANEL
Anion gap: 7 (ref 5–15)
BUN: 17 mg/dL (ref 6–20)
CHLORIDE: 105 mmol/L (ref 101–111)
CO2: 27 mmol/L (ref 22–32)
Calcium: 8.1 mg/dL — ABNORMAL LOW (ref 8.9–10.3)
Creatinine, Ser: 1.39 mg/dL — ABNORMAL HIGH (ref 0.61–1.24)
GFR calc Af Amer: 51 mL/min — ABNORMAL LOW (ref >60)
GFR calc non Af Amer: 44 mL/min — ABNORMAL LOW (ref >60)
GLUCOSE: 128 mg/dL — AB (ref 65–99)
POTASSIUM: 3.2 mmol/L — AB (ref 3.5–5.1)
Sodium: 139 mmol/L (ref 135–145)

## 2015-10-25 NOTE — ED Triage Notes (Signed)
Pt arrives with home health nurse. Pt pulled out foley last night and has not voided since. Hx of dementia, oriented per norm.

## 2015-10-25 NOTE — ED Provider Notes (Signed)
[facility] DEPT Provider Note   CSN: [id number] Arrival date & time: 10/25/15  1313     History   Chief Complaint Chief Complaint  Patient presents with  . Urinary Retention    HPI Jon Davis is a 82 y.o. male.  The history is provided by a friend.  Male GU Problem  Primary symptoms comment: urinary retention after pulling out foley catheter. This is a new problem. The current episode started yesterday. The problem occurs constantly. The problem has not changed since onset.Associated symptoms comments: Lack of urinary output with urethral stricture. There has been no fever. He has tried nothing for the symptoms.    Past Medical History:  Diagnosis Date  . Anemia 06/2012  . Atrial fibrillation ([place])   . BPH (benign prostatic hyperplasia)    Followed by Dr. Matilde Sprang  . Brady-tachy syndrome ([place])    04/17/2009 MDT pacer Enrhythmatr fib  . CAD (coronary artery disease)   . Dementia in Alzheimer's disease   . Depression   . Diabetes ([place])   . Diverticulosis 02/26/2010   Sigmoid colon  . Duodenitis 02/26/2010  . Elevated PSA   . Episodic VTach, pacer    Followed by Dr. Avon Gully  . Fall 03/2014   elbow fluid   . Gastritis 02/26/2010   moderate  . Hiatal hernia 02/26/2010  . Hyperlipidemia   . Hypertension   . Memory loss   . Microalbuminuria   . Urinary incontinence     Patient Active Problem List   Diagnosis Date Noted  . UTI (urinary tract infection) 10/21/2015  . Urinary retention 10/21/2015  . Sigmoid diverticulitis 10/17/2015  . Dementia 10/17/2015  . ARF (acute renal failure) ([place]) 10/17/2015  . Diarrhea 10/17/2015  . AKI (acute kidney injury) ([place])   . Cerebral degeneration, unspecified 05/26/2013  . Restless legs syndrome (RLS) 05/26/2013  . CAD (coronary artery disease) 07/31/2012  . Pacemaker 07/31/2012  . Nonsustained ventricular tachycardia ([place]) 07/31/2012  . Aortic atherosclerosis ([place]) 07/31/2012  . Microcytic anemia 06/29/2012  .  Long term (current) use of anticoagulants 04/26/2012  . THYROID NODULE 01/23/2010  . Type 2 diabetes mellitus with vascular disease ([place]) 01/23/2010  . Hyperlipidemia 01/23/2010  . ANEMIA, IRON DEFICIENCY 01/23/2010  . Memory loss 01/23/2010  . Essential hypertension 01/23/2010  . Atrial fibrillation ([place]) 01/23/2010  . CVA 01/23/2010  . ESOPHAGEAL STRICTURE 01/23/2010  . HIATAL HERNIA 01/23/2010  . DIVERTICULOSIS, COLON 01/23/2010  . HEMOCCULT POSITIVE STOOL 01/23/2010    Past Surgical History:  Procedure Laterality Date  . APPENDECTOMY  1948  . CARDIAC CATHETERIZATION  07/07/2007   mild to mod. ca+ w/narrowing of 20% ostium of second diagonal,diffuse 80% stenosis small caliber infer. branch,20% mid LAD,60-70% narrowing mid atrioventricular groove CX,mild 20-30% RCA  . CAROTID DOPPLER  10/01/2006   0-49% right bulb,prox,ECA & prox ICA,0-49% left subclavian bulb & prox ICA,right & left ICA abn. resistance  . Cataracts    . CHOLECYSTECTOMY    . NM MYOVIEW LTD  10/01/2010   No ischemia  . PERMANENT PACEMAKER INSERTION  04/17/2009   MDT Enrhythm       Home Medications    Prior to Admission medications   Medication Sig Start Date End Date Taking? Authorizing Provider  ALPRAZolam (XANAX) 0.25 MG tablet Take 0.25 mg by mouth 2 (two) times daily.    Yes Historical Provider, MD  amLODipine (NORVASC) 5 MG tablet Take 2.5 mg by mouth daily.    Yes Historical Provider, MD  aspirin 81 MG  tablet Take 81 mg by mouth daily.   Yes Historical Provider, MD  atorvastatin (LIPITOR) 20 MG tablet Take 20 mg by mouth every evening.    Yes Historical Provider, MD  cefUROXime (CEFTIN) 500 MG tablet Take 1 tablet (500 mg total) by mouth 2 (two) times daily with a meal. 10/21/15  Yes Debbe Odea, MD  donepezil (ARICEPT) 23 MG TABS tablet Take 1 tablet (23 mg total) by mouth at bedtime. 02/21/15  Yes Dennie Bible, NP  ferrous sulfate 325 (65 FE) MG tablet Take 325 mg by mouth 2 (two) times daily.    Yes Historical Provider, MD  glipiZIDE (GLUCOTROL) 10 MG tablet Take 10 mg by mouth daily. 04/21/14  Yes Historical Provider, MD  isosorbide mononitrate (IMDUR) 30 MG 24 hr tablet Take 30 mg by mouth daily.   Yes Historical Provider, MD  memantine (NAMENDA) 10 MG tablet Take 10 mg by mouth 2 (two) times daily.   Yes Historical Provider, MD  metoprolol succinate (TOPROL-XL) 25 MG 24 hr tablet Take 1 tablet (25 mg total) by mouth daily. Take with or immediately following a meal. 09/13/15  Yes Mihai Croitoru, MD  metroNIDAZOLE (FLAGYL) 500 MG tablet Take 1 tablet (500 mg total) by mouth 3 (three) times daily. Patient taking differently: Take 500 mg by mouth 3 (three) times daily. ABT Start Date 10/21/15 & End Date 10/26/15. 10/21/15  Yes Debbe Odea, MD  pantoprazole (PROTONIX) 40 MG tablet Take 40 mg by mouth daily.   Yes Historical Provider, MD  pramipexole (MIRAPEX) 0.125 MG tablet Take 2 tablets (0.25 mg total) by mouth every evening. Two hours before bedtime 06/29/14  Yes Dennie Bible, NP  sertraline (ZOLOFT) 50 MG tablet Take 50 mg by mouth daily.   Yes Historical Provider, MD  tamsulosin (FLOMAX) 0.4 MG CAPS capsule Take 1 capsule (0.4 mg total) by mouth daily. 10/21/15  Yes Debbe Odea, MD  HYDROcodone-acetaminophen (NORCO/VICODIN) 5-325 MG per tablet Take 1 tablet by mouth every 6 (six) hours as needed for moderate pain or severe pain.     Historical Provider, MD  ondansetron (ZOFRAN-ODT) 8 MG disintegrating tablet DIS 1/2 T PO Q 8 H PRF NAUSEA 10/20/15   Historical Provider, MD    Family History Family History  Problem Relation Age of Onset  . Alzheimer's disease Mother     Deceased  . Heart disease Father     Deceased    Social History Social History  Substance Use Topics  . Smoking status: Former Research scientist (life sciences)  . Smokeless tobacco: Former Systems developer    Quit date: 02/09/1974  . Alcohol use No     Allergies   Penicillins   Review of Systems Review of Systems  All other systems  reviewed and are negative.    Physical Exam Updated Vital Signs BP (!) 121/54 (BP Location: Right Arm)   Pulse 64   Temp 98.4 F (36.9 C) (Oral)   Resp 19   SpO2 95%   Physical Exam  Constitutional: He appears well-developed and well-nourished. No distress.  HENT:  Head: Normocephalic and atraumatic.  Eyes: Conjunctivae are normal.  Neck: Neck supple. No tracheal deviation present.  Cardiovascular: Normal rate and regular rhythm.   Pulmonary/Chest: Effort normal. No respiratory distress.  Abdominal: Soft. He exhibits no distension. There is tenderness (mild) in the suprapubic area. There is no rebound, no guarding and no CVA tenderness.  Genitourinary: Testes normal. Right testis shows no tenderness. Left testis shows no tenderness. Phimosis present. No discharge found.  Neurological: He is alert. He is disoriented (at baseline per available family friend).  Skin: Skin is warm and dry.  Psychiatric: He has a normal mood and affect.  Vitals reviewed.    ED Treatments / Results  Labs (all labs ordered are listed, but only abnormal results are displayed) Labs Reviewed  CBC WITH DIFFERENTIAL/PLATELET - Abnormal; Notable for the following:       Result Value   Monocytes Absolute 1.4 (*)    All other components within normal limits  BASIC METABOLIC PANEL - Abnormal; Notable for the following:    Potassium 3.2 (*)    Glucose, Bld 128 (*)    Creatinine, Ser 1.39 (*)    Calcium 8.1 (*)    GFR calc non Af Amer 44 (*)    GFR calc Af Amer 51 (*)    All other components within normal limits  URINALYSIS, ROUTINE W REFLEX MICROSCOPIC (NOT AT [facility]) - Abnormal; Notable for the following:    Color, Urine AMBER (*)    APPearance CLOUDY (*)    Hgb urine dipstick SMALL (*)    Nitrite POSITIVE (*)    Leukocytes, UA SMALL (*)    All other components within normal limits  URINE MICROSCOPIC-ADD ON - Abnormal; Notable for the following:    Squamous Epithelial / LPF 0-5 (*)    Bacteria,  UA FEW (*)    All other components within normal limits    EKG  EKG Interpretation None       Radiology No results found.  Procedures Procedures (including critical care time)  Procedure note: Foley catheter placement I was called to bedside for difficult Foley placement in patient with urethral stricture. Area was cleaned with Betadine and 12 French Foley catheter introduced with return of urine under sterile technique. Balloon inflated and appropriately placed. Patient without any signs of complication.  Medications Ordered in ED Medications - No data to display   Initial Impression / Assessment and Plan / ED Course  I have reviewed the triage vital signs and the nursing notes.  Pertinent labs & imaging results that were available during my care of the patient were reviewed by me and considered in my medical decision making (see chart for details).  Clinical Course    82 y.o. male presents after pulling out his foley catheter at home. It was difficult to place according to family friend who helps to provide history. They do not know previous placed size. Pt is already on a cephalosporin pending OP surgical consultation for urethral stricture. Staff unable to pass 16 Fr foley, I was able to pass a 12 Fr foley myself. Labs and UA reassuring, nitrites persistent but no evidence of ongoing or resistant infection with no pyuria. Pt is to f/u as scheduled with urology and return to ED with any further urinary catheter complications.   Final Clinical Impressions(s) / ED Diagnoses   Final diagnoses:  Urinary retention  Foley catheter problem, initial encounter [facility])    New Prescriptions New Prescriptions   No medications on file     Leo Grosser, MD 10/25/15 2100

## 2015-10-25 NOTE — Progress Notes (Signed)
Surgery on 11/20/2015 .  Preop on 11/15/2015.  Need orders in EPIc.  Thank You!

## 2015-10-26 DIAGNOSIS — R339 Retention of urine, unspecified: Secondary | ICD-10-CM | POA: Diagnosis not present

## 2015-10-27 ENCOUNTER — Encounter (HOSPITAL_COMMUNITY): Payer: Self-pay | Admitting: Emergency Medicine

## 2015-10-27 ENCOUNTER — Emergency Department (HOSPITAL_COMMUNITY)
Admission: EM | Admit: 2015-10-27 | Discharge: 2015-10-27 | Discharge status: Absent without leave | Payer: Medicare Other

## 2015-10-27 ENCOUNTER — Inpatient Hospital Stay (HOSPITAL_COMMUNITY): Payer: Medicare Other

## 2015-10-27 ENCOUNTER — Inpatient Hospital Stay (HOSPITAL_COMMUNITY)
Admission: EM | Admit: 2015-10-27 | Discharge: 2015-10-31 | DRG: 713 | Disposition: A | Discharge status: Home Health | Payer: Medicare Other | Attending: Internal Medicine | Admitting: Internal Medicine

## 2015-10-27 DIAGNOSIS — R338 Other retention of urine: Secondary | ICD-10-CM | POA: Diagnosis present

## 2015-10-27 DIAGNOSIS — Z9049 Acquired absence of other specified parts of digestive tract: Secondary | ICD-10-CM

## 2015-10-27 DIAGNOSIS — E1169 Type 2 diabetes mellitus with other specified complication: Secondary | ICD-10-CM | POA: Diagnosis not present

## 2015-10-27 DIAGNOSIS — I482 Chronic atrial fibrillation: Secondary | ICD-10-CM | POA: Diagnosis not present

## 2015-10-27 DIAGNOSIS — I10 Essential (primary) hypertension: Secondary | ICD-10-CM | POA: Diagnosis not present

## 2015-10-27 DIAGNOSIS — F0390 Unspecified dementia without behavioral disturbance: Secondary | ICD-10-CM | POA: Diagnosis present

## 2015-10-27 DIAGNOSIS — Z8249 Family history of ischemic heart disease and other diseases of the circulatory system: Secondary | ICD-10-CM | POA: Diagnosis not present

## 2015-10-27 DIAGNOSIS — C61 Malignant neoplasm of prostate: Secondary | ICD-10-CM | POA: Diagnosis not present

## 2015-10-27 DIAGNOSIS — N183 Chronic kidney disease, stage 3 (moderate): Secondary | ICD-10-CM | POA: Diagnosis present

## 2015-10-27 DIAGNOSIS — E1159 Type 2 diabetes mellitus with other circulatory complications: Secondary | ICD-10-CM | POA: Diagnosis present

## 2015-10-27 DIAGNOSIS — N320 Bladder-neck obstruction: Secondary | ICD-10-CM | POA: Diagnosis not present

## 2015-10-27 DIAGNOSIS — Z66 Do not resuscitate: Secondary | ICD-10-CM | POA: Diagnosis present

## 2015-10-27 DIAGNOSIS — N179 Acute kidney failure, unspecified: Secondary | ICD-10-CM | POA: Diagnosis not present

## 2015-10-27 DIAGNOSIS — R262 Difficulty in walking, not elsewhere classified: Secondary | ICD-10-CM

## 2015-10-27 DIAGNOSIS — G309 Alzheimer's disease, unspecified: Secondary | ICD-10-CM | POA: Diagnosis not present

## 2015-10-27 DIAGNOSIS — I4891 Unspecified atrial fibrillation: Secondary | ICD-10-CM | POA: Diagnosis present

## 2015-10-27 DIAGNOSIS — M6281 Muscle weakness (generalized): Secondary | ICD-10-CM

## 2015-10-27 DIAGNOSIS — Z7982 Long term (current) use of aspirin: Secondary | ICD-10-CM | POA: Diagnosis not present

## 2015-10-27 DIAGNOSIS — Z87891 Personal history of nicotine dependence: Secondary | ICD-10-CM | POA: Diagnosis not present

## 2015-10-27 DIAGNOSIS — E785 Hyperlipidemia, unspecified: Secondary | ICD-10-CM | POA: Diagnosis present

## 2015-10-27 DIAGNOSIS — N471 Phimosis: Secondary | ICD-10-CM | POA: Diagnosis present

## 2015-10-27 DIAGNOSIS — E119 Type 2 diabetes mellitus without complications: Secondary | ICD-10-CM | POA: Diagnosis not present

## 2015-10-27 DIAGNOSIS — E1122 Type 2 diabetes mellitus with diabetic chronic kidney disease: Secondary | ICD-10-CM | POA: Diagnosis present

## 2015-10-27 DIAGNOSIS — F0280 Dementia in other diseases classified elsewhere without behavioral disturbance: Secondary | ICD-10-CM | POA: Diagnosis not present

## 2015-10-27 DIAGNOSIS — I2510 Atherosclerotic heart disease of native coronary artery without angina pectoris: Secondary | ICD-10-CM | POA: Diagnosis not present

## 2015-10-27 DIAGNOSIS — E876 Hypokalemia: Secondary | ICD-10-CM | POA: Diagnosis present

## 2015-10-27 DIAGNOSIS — K219 Gastro-esophageal reflux disease without esophagitis: Secondary | ICD-10-CM | POA: Diagnosis present

## 2015-10-27 DIAGNOSIS — I129 Hypertensive chronic kidney disease with stage 1 through stage 4 chronic kidney disease, or unspecified chronic kidney disease: Secondary | ICD-10-CM | POA: Diagnosis present

## 2015-10-27 DIAGNOSIS — Z531 Procedure and treatment not carried out because of patient's decision for reasons of belief and group pressure: Secondary | ICD-10-CM | POA: Diagnosis present

## 2015-10-27 DIAGNOSIS — Z820 Family history of epilepsy and other diseases of the nervous system: Secondary | ICD-10-CM

## 2015-10-27 DIAGNOSIS — Z950 Presence of cardiac pacemaker: Secondary | ICD-10-CM

## 2015-10-27 DIAGNOSIS — R079 Chest pain, unspecified: Secondary | ICD-10-CM | POA: Diagnosis not present

## 2015-10-27 DIAGNOSIS — Z23 Encounter for immunization: Secondary | ICD-10-CM | POA: Diagnosis not present

## 2015-10-27 DIAGNOSIS — Z79899 Other long term (current) drug therapy: Secondary | ICD-10-CM

## 2015-10-27 DIAGNOSIS — R339 Retention of urine, unspecified: Secondary | ICD-10-CM | POA: Diagnosis not present

## 2015-10-27 DIAGNOSIS — IMO0001 Reserved for inherently not codable concepts without codable children: Secondary | ICD-10-CM

## 2015-10-27 DIAGNOSIS — Z7984 Long term (current) use of oral hypoglycemic drugs: Secondary | ICD-10-CM

## 2015-10-27 DIAGNOSIS — F329 Major depressive disorder, single episode, unspecified: Secondary | ICD-10-CM | POA: Diagnosis present

## 2015-10-27 LAB — BASIC METABOLIC PANEL
ANION GAP: 9 (ref 5–15)
BUN: 13 mg/dL (ref 6–20)
CHLORIDE: 106 mmol/L (ref 101–111)
CO2: 25 mmol/L (ref 22–32)
Calcium: 8.3 mg/dL — ABNORMAL LOW (ref 8.9–10.3)
Creatinine, Ser: 1.31 mg/dL — ABNORMAL HIGH (ref 0.61–1.24)
GFR calc Af Amer: 55 mL/min — ABNORMAL LOW (ref >60)
GFR calc non Af Amer: 47 mL/min — ABNORMAL LOW (ref >60)
GLUCOSE: 170 mg/dL — AB (ref 65–99)
POTASSIUM: 3.2 mmol/L — AB (ref 3.5–5.1)
Sodium: 140 mmol/L (ref 135–145)

## 2015-10-27 LAB — CBC
HEMATOCRIT: 39.9 % (ref 39.0–52.0)
HEMOGLOBIN: 13.6 g/dL (ref 13.0–17.0)
MCH: 30.5 pg (ref 26.0–34.0)
MCHC: 34.1 g/dL (ref 30.0–36.0)
MCV: 89.5 fL (ref 78.0–100.0)
Platelets: 327 10*3/uL (ref 150–400)
RBC: 4.46 MIL/uL (ref 4.22–5.81)
RDW: 13.6 % (ref 11.5–15.5)
WBC: 9.3 10*3/uL (ref 4.0–10.5)

## 2015-10-27 LAB — GLUCOSE, CAPILLARY
GLUCOSE-CAPILLARY: 145 mg/dL — AB (ref 65–99)
GLUCOSE-CAPILLARY: 171 mg/dL — AB (ref 65–99)

## 2015-10-27 LAB — TYPE AND SCREEN
ABO/RH(D): O NEG
Antibody Screen: NEGATIVE

## 2015-10-27 LAB — APTT: aPTT: 23 seconds — ABNORMAL LOW (ref 24–36)

## 2015-10-27 LAB — TROPONIN I

## 2015-10-27 LAB — ABO/RH: ABO/RH(D): O NEG

## 2015-10-27 LAB — PROTIME-INR
INR: 1.07
Prothrombin Time: 13.9 seconds (ref 11.4–15.2)

## 2015-10-27 MED ORDER — ACETAMINOPHEN 650 MG RE SUPP: 650.0000 mg | Freq: Four times a day (QID) | RECTAL | Status: DC | PRN

## 2015-10-27 MED ORDER — POTASSIUM CHLORIDE 20 MEQ/15ML (10%) PO SOLN
40.0000 meq | Freq: Once | ORAL | Status: AC
  Administered 2015-10-27: 40 meq via ORAL
  Filled 2015-10-27: qty 30

## 2015-10-27 MED ORDER — SODIUM CHLORIDE 0.9% FLUSH
3.0000 mL | Freq: Two times a day (BID) | INTRAVENOUS | Status: DC
  Administered 2015-10-27 – 2015-10-30 (×6): 3 mL via INTRAVENOUS

## 2015-10-27 MED ORDER — CHLORHEXIDINE GLUCONATE CLOTH 2 % EX PADS
6.0000 | MEDICATED_PAD | Freq: Every day | CUTANEOUS | Status: DC
  Administered 2015-10-27 – 2015-10-30 (×2): 6 via TOPICAL

## 2015-10-27 MED ORDER — PANTOPRAZOLE SODIUM 40 MG PO TBEC
40.0000 mg | DELAYED_RELEASE_TABLET | Freq: Every day | ORAL | Status: DC
  Administered 2015-10-27 – 2015-10-31 (×4): 40 mg via ORAL
  Filled 2015-10-27 (×4): qty 1

## 2015-10-27 MED ORDER — INSULIN ASPART 100 UNIT/ML ~~LOC~~ SOLN
0.0000 [IU] | Freq: Three times a day (TID) | SUBCUTANEOUS | Status: DC
  Administered 2015-10-27 – 2015-10-28 (×2): 1 [IU] via SUBCUTANEOUS
  Administered 2015-10-29: 2 [IU] via SUBCUTANEOUS
  Administered 2015-10-29 (×2): 1 [IU] via SUBCUTANEOUS
  Administered 2015-10-30: 3 [IU] via SUBCUTANEOUS
  Administered 2015-10-30 – 2015-10-31 (×2): 1 [IU] via SUBCUTANEOUS
  Administered 2015-10-31: 2 [IU] via SUBCUTANEOUS

## 2015-10-27 MED ORDER — SERTRALINE HCL 50 MG PO TABS
50.0000 mg | ORAL_TABLET | Freq: Every day | ORAL | Status: DC
  Administered 2015-10-29 – 2015-10-31 (×3): 50 mg via ORAL
  Filled 2015-10-27 (×3): qty 1

## 2015-10-27 MED ORDER — MEMANTINE HCL 10 MG PO TABS
10.0000 mg | ORAL_TABLET | Freq: Two times a day (BID) | ORAL | Status: DC
  Administered 2015-10-27 – 2015-10-31 (×7): 10 mg via ORAL
  Filled 2015-10-27 (×7): qty 1

## 2015-10-27 MED ORDER — SILVER SULFADIAZINE 1 % EX CREA: 1.0000 "application " | TOPICAL_CREAM | Freq: Every day | CUTANEOUS | Status: DC

## 2015-10-27 MED ORDER — ACETAMINOPHEN 325 MG PO TABS: 650.0000 mg | ORAL_TABLET | Freq: Four times a day (QID) | ORAL | Status: DC | PRN

## 2015-10-27 MED ORDER — ALPRAZOLAM 0.25 MG PO TABS
0.2500 mg | ORAL_TABLET | Freq: Two times a day (BID) | ORAL | Status: DC
  Administered 2015-10-27 – 2015-10-31 (×7): 0.25 mg via ORAL
  Filled 2015-10-27 (×7): qty 1

## 2015-10-27 MED ORDER — TAMSULOSIN HCL 0.4 MG PO CAPS
0.4000 mg | ORAL_CAPSULE | Freq: Every day | ORAL | Status: DC
  Administered 2015-10-27: 0.4 mg via ORAL
  Filled 2015-10-27: qty 1

## 2015-10-27 MED ORDER — INFLUENZA VAC SPLIT QUAD 0.5 ML IM SUSY
0.5000 mL | PREFILLED_SYRINGE | INTRAMUSCULAR | Status: DC
  Filled 2015-10-27 (×2): qty 0.5

## 2015-10-27 MED ORDER — PRAMIPEXOLE DIHYDROCHLORIDE 0.25 MG PO TABS
0.2500 mg | ORAL_TABLET | Freq: Every evening | ORAL | Status: DC
  Administered 2015-10-27 – 2015-10-31 (×5): 0.25 mg via ORAL
  Filled 2015-10-27 (×5): qty 1

## 2015-10-27 MED ORDER — SODIUM CHLORIDE 0.9 % IV SOLN
INTRAVENOUS | Status: DC
  Administered 2015-10-27 – 2015-10-28 (×2): via INTRAVENOUS

## 2015-10-27 MED ORDER — MUPIROCIN 2 % EX OINT
1.0000 "application " | TOPICAL_OINTMENT | Freq: Two times a day (BID) | CUTANEOUS | Status: DC
  Administered 2015-10-27 – 2015-10-31 (×7): 1 via NASAL
  Filled 2015-10-27: qty 22

## 2015-10-27 MED ORDER — ISOSORBIDE MONONITRATE ER 30 MG PO TB24
30.0000 mg | ORAL_TABLET | Freq: Every day | ORAL | Status: DC
  Administered 2015-10-27 – 2015-10-31 (×4): 30 mg via ORAL
  Filled 2015-10-27 (×4): qty 1

## 2015-10-27 MED ORDER — AMLODIPINE BESYLATE 5 MG PO TABS
2.5000 mg | ORAL_TABLET | Freq: Every day | ORAL | Status: DC
  Administered 2015-10-27 – 2015-10-31 (×4): 2.5 mg via ORAL
  Filled 2015-10-27 (×4): qty 1

## 2015-10-27 MED ORDER — METOPROLOL SUCCINATE ER 25 MG PO TB24
25.0000 mg | ORAL_TABLET | Freq: Every day | ORAL | Status: DC
Start: 2015-10-27 — End: 2015-10-31
  Administered 2015-10-27 – 2015-10-31 (×4): 25 mg via ORAL
  Filled 2015-10-27 (×4): qty 1

## 2015-10-27 MED ORDER — ATORVASTATIN CALCIUM 10 MG PO TABS
20.0000 mg | ORAL_TABLET | Freq: Every evening | ORAL | Status: DC
  Administered 2015-10-27 – 2015-10-31 (×5): 20 mg via ORAL
  Filled 2015-10-27 (×2): qty 2
  Filled 2015-10-27: qty 1
  Filled 2015-10-27 (×2): qty 2
  Filled 2015-10-27: qty 1
  Filled 2015-10-27: qty 2

## 2015-10-27 MED ORDER — FERROUS SULFATE 325 (65 FE) MG PO TABS
325.0000 mg | ORAL_TABLET | Freq: Two times a day (BID) | ORAL | Status: DC
  Administered 2015-10-28 – 2015-10-31 (×7): 325 mg via ORAL
  Filled 2015-10-27 (×7): qty 1

## 2015-10-27 MED ORDER — INSULIN ASPART 100 UNIT/ML ~~LOC~~ SOLN: 0.0000 [IU] | Freq: Every day | SUBCUTANEOUS | Status: DC

## 2015-10-27 MED ORDER — HYDROCODONE-ACETAMINOPHEN 5-325 MG PO TABS
1.0000 | ORAL_TABLET | Freq: Four times a day (QID) | ORAL | Status: DC | PRN
  Administered 2015-10-28 – 2015-10-29 (×2): 1 via ORAL
  Filled 2015-10-27 (×2): qty 1

## 2015-10-27 MED ORDER — ONDANSETRON 4 MG PO TBDP: 4.0000 mg | ORAL_TABLET | Freq: Three times a day (TID) | ORAL | Status: DC | PRN

## 2015-10-27 MED ORDER — DONEPEZIL HCL 23 MG PO TABS
23.0000 mg | ORAL_TABLET | Freq: Every day | ORAL | Status: DC
  Administered 2015-10-27 – 2015-10-30 (×4): 23 mg via ORAL
  Filled 2015-10-27 (×5): qty 1

## 2015-10-27 NOTE — ED Notes (Signed)
Bed: [id number] Expected date:  Expected time:  Means of arrival:  Comments: Hold for [place]

## 2015-10-27 NOTE — H&P (Signed)
History and Physical    Jon Davis [id number] DOB: 1927-01-07 DOA: 10/27/2015  Referring MD/NP/PA:   PCP: Haywood Pao, MD   Patient coming from:  The patient is coming from home.  At baseline, pt is dependent for most of ADL.   Chief Complaint: Urinary retention and Foley catheter issues.   HPI: Jon Davis is a 81 y.o. male with medical history significant of dementia, A. fib not on anticoagulants due to dementia, hypertension, hyperlipidemia, diabetes mellitus, GERD, depression, CKD-III, BPH, CAD, s/p of pacemaker placement, who presents with urinary retention and Foley catheter issues.   Patient has dementia and is unable to provide accurate medical history, therefore, most of the history is obtained by discussing the case with ED physician and per his friend.  Pt has hx of  urinary retention and need Foley catheter placement, but due to dementia, he keeps pulling out his Foley catheter for 6 times. Patient continues to remove Foley catheter with scissors and fingernail clippers. He's been seen numerous times in the ED for similar presentation. He presents with urinary retention today. He has mild suprapubic pain, but no symptoms of UTI per his friend. Patient does not seem to have chest pain, cough, nausea, vomiting, abdominal pain. He has some mild loose stool, but no obvious diarrhea per his friend. Patient moves extremities normally.  ED Course: lab tests (CCB, BMP and UA are pending), temperature normal, no tachycardia, no tachypnea. Patient is admitted to telemetry bed as inpatient for possible TURP procedure per urology, Dr. Louis Meckel.   Review of Systems: Could not be reviewed accurately due to dementia  Allergy:  Allergies  Allergen Reactions  . Penicillins Rash    Has patient had a PCN reaction causing immediate rash, facial/tongue/throat swelling, SOB or lightheadedness with hypotension: No  Has patient had a PCN reaction causing severe rash involving mucus  membranes or skin necrosis: unknown Has patient had a PCN reaction that required hospitalization: No  Has patient had a PCN reaction occurring within the last 10 years: No  If all of the above answers are "NO", then may proceed with Cephalosporin use.    Past Medical History:  Diagnosis Date  . Anemia 06/2012  . Atrial fibrillation ([place])   . BPH (benign prostatic hyperplasia)    Followed by Dr. Matilde Sprang  . Brady-tachy syndrome ([place])    04/17/2009 MDT pacer Enrhythmatr fib  . CAD (coronary artery disease)   . Dementia in Alzheimer's disease   . Depression   . Diabetes ([place])   . Diverticulosis 02/26/2010   Sigmoid colon  . Duodenitis 02/26/2010  . Elevated PSA   . Episodic VTach, pacer    Followed by Dr. Avon Gully  . Fall 03/2014   elbow fluid   . Gastritis 02/26/2010   moderate  . Hiatal hernia 02/26/2010  . Hyperlipidemia   . Hypertension   . Memory loss   . Microalbuminuria   . Urinary incontinence     Past Surgical History:  Procedure Laterality Date  . APPENDECTOMY  1948  . CARDIAC CATHETERIZATION  07/07/2007   mild to mod. ca+ w/narrowing of 20% ostium of second diagonal,diffuse 80% stenosis small caliber infer. branch,20% mid LAD,60-70% narrowing mid atrioventricular groove CX,mild 20-30% RCA  . CAROTID DOPPLER  10/01/2006   0-49% right bulb,prox,ECA & prox ICA,0-49% left subclavian bulb & prox ICA,right & left ICA abn. resistance  . Cataracts    . CHOLECYSTECTOMY    . NM MYOVIEW LTD  10/01/2010   No  ischemia  . PERMANENT PACEMAKER INSERTION  04/17/2009   MDT Enrhythm    Social History:  reports that he has quit smoking. He quit smokeless tobacco use about 41 years ago. He reports that he does not drink alcohol or use drugs.  Family History:  Family History  Problem Relation Age of Onset  . Alzheimer's disease Mother     Deceased  . Heart disease Father     Deceased     Prior to Admission medications   Medication Sig Start Date End Date Taking? Authorizing  Provider  ALPRAZolam (XANAX) 0.25 MG tablet Take 0.25 mg by mouth 2 (two) times daily.     Historical Provider, MD  amLODipine (NORVASC) 5 MG tablet Take 2.5 mg by mouth daily.     Historical Provider, MD  aspirin 81 MG tablet Take 81 mg by mouth daily.    Historical Provider, MD  atorvastatin (LIPITOR) 20 MG tablet Take 20 mg by mouth every evening.     Historical Provider, MD  cefUROXime (CEFTIN) 500 MG tablet Take 1 tablet (500 mg total) by mouth 2 (two) times daily with a meal. 10/21/15   Debbe Odea, MD  donepezil (ARICEPT) 23 MG TABS tablet Take 1 tablet (23 mg total) by mouth at bedtime. 02/21/15   Dennie Bible, NP  ferrous sulfate 325 (65 FE) MG tablet Take 325 mg by mouth 2 (two) times daily.    Historical Provider, MD  glipiZIDE (GLUCOTROL) 10 MG tablet Take 10 mg by mouth daily. 04/21/14   Historical Provider, MD  HYDROcodone-acetaminophen (NORCO/VICODIN) 5-325 MG per tablet Take 1 tablet by mouth every 6 (six) hours as needed for moderate pain or severe pain.     Historical Provider, MD  isosorbide mononitrate (IMDUR) 30 MG 24 hr tablet Take 30 mg by mouth daily.    Historical Provider, MD  memantine (NAMENDA) 10 MG tablet Take 10 mg by mouth 2 (two) times daily.    Historical Provider, MD  metoprolol succinate (TOPROL-XL) 25 MG 24 hr tablet Take 1 tablet (25 mg total) by mouth daily. Take with or immediately following a meal. 09/13/15   Mihai Croitoru, MD  metroNIDAZOLE (FLAGYL) 500 MG tablet Take 1 tablet (500 mg total) by mouth 3 (three) times daily. Patient taking differently: Take 500 mg by mouth 3 (three) times daily. ABT Start Date 10/21/15 & End Date 10/26/15. 10/21/15   Debbe Odea, MD  ondansetron (ZOFRAN-ODT) 8 MG disintegrating tablet DIS 1/2 T PO Q 8 H PRF NAUSEA 10/20/15   Historical Provider, MD  pantoprazole (PROTONIX) 40 MG tablet Take 40 mg by mouth daily.    Historical Provider, MD  pramipexole (MIRAPEX) 0.125 MG tablet Take 2 tablets (0.25 mg total) by mouth every  evening. Two hours before bedtime 06/29/14   Dennie Bible, NP  sertraline (ZOLOFT) 50 MG tablet Take 50 mg by mouth daily.    Historical Provider, MD  SSD 1 % cream Apply 1 application topically daily. APPLY 1 APPLICATION TO AFFECTED AREA ONCE DAILY FOR 14 DAYS 10/24/15   Historical Provider, MD  tamsulosin (FLOMAX) 0.4 MG CAPS capsule Take 1 capsule (0.4 mg total) by mouth daily. 10/21/15   Debbe Odea, MD    Physical Exam: Vitals:   10/27/15 1411  BP: 144/73  Pulse: 68  Resp: 18  Temp: 98.3 F (36.8 C)  TempSrc: Oral  SpO2: 97%   General: Not in acute distress HEENT:       Eyes: PERRL, EOMI, no scleral icterus.  ENT: No discharge from the ears and nose, no pharynx injection, no tonsillar enlargement.        Neck: No JVD, no bruit, no mass felt. Heme: No neck lymph node enlargement. Cardiac: S1/S2, RRR, No murmurs, No gallops or rubs. Respiratory:  No rales, wheezing, rhonchi or rubs. GI: Soft, suprapubic area is full with mild tenderness. no rebound pain, no organomegaly, BS present. GU: No hematuria Ext: No pitting leg edema bilaterally. 2+DP/PT pulse bilaterally. Musculoskeletal: No joint deformities, No joint redness or warmth, no limitation of ROM in spin. Skin: No rashes.  Neuro: Alert, oriented X3, cranial nerves II-XII grossly intact, moves all extremities normally. Psych: Cannot be reviewed accurately due to dementia  Labs on Admission: I have personally reviewed following labs and imaging studies  CBC:  Recent Labs Lab 10/25/15 1533  WBC 10.2  NEUTROABS 6.7  HGB 14.0  HCT 39.5  MCV 87.2  PLT 123456   Basic Metabolic Panel:  Recent Labs Lab 10/21/15 0543 10/25/15 1533  NA 140 139  K 3.5 3.2*  CL 107 105  CO2 23 27  GLUCOSE 121* 128*  BUN 14 17  CREATININE 1.16 1.39*  CALCIUM 8.2* 8.1*   GFR: Estimated Creatinine Clearance: 41.1 mL/min (by C-G formula based on SCr of 1.39 mg/dL (H)). Liver Function Tests: No results for input(s): AST,  ALT, ALKPHOS, BILITOT, PROT, ALBUMIN in the last 168 hours. No results for input(s): LIPASE, AMYLASE in the last 168 hours. No results for input(s): AMMONIA in the last 168 hours. Coagulation Profile: No results for input(s): INR, PROTIME in the last 168 hours. Cardiac Enzymes: No results for input(s): CKTOTAL, CKMB, CKMBINDEX, TROPONINI in the last 168 hours. BNP (last 3 results) No results for input(s): PROBNP in the last 8760 hours. HbA1C: No results for input(s): HGBA1C in the last 72 hours. CBG:  Recent Labs Lab 10/20/15 1628 10/20/15 2157 10/21/15 0754 10/21/15 1207  GLUCAP 109* 130* 115* 140*   Lipid Profile: No results for input(s): CHOL, HDL, LDLCALC, TRIG, CHOLHDL, LDLDIRECT in the last 72 hours. Thyroid Function Tests: No results for input(s): TSH, T4TOTAL, FREET4, T3FREE, THYROIDAB in the last 72 hours. Anemia Panel: No results for input(s): VITAMINB12, FOLATE, FERRITIN, TIBC, IRON, RETICCTPCT in the last 72 hours. Urine analysis:    Component Value Date/Time   COLORURINE AMBER (A) 10/25/2015 1459   APPEARANCEUR CLOUDY (A) 10/25/2015 1459   LABSPEC 1.018 10/25/2015 1459   PHURINE 6.0 10/25/2015 1459   GLUCOSEU NEGATIVE 10/25/2015 1459   HGBUR SMALL (A) 10/25/2015 1459   BILIRUBINUR NEGATIVE 10/25/2015 1459   BILIRUBINUR small 08/19/2012 1241   KETONESUR NEGATIVE 10/25/2015 1459   PROTEINUR NEGATIVE 10/25/2015 1459   UROBILINOGEN 0.2 08/19/2012 1241   UROBILINOGEN 0.2 03/20/2009 0645   NITRITE POSITIVE (A) 10/25/2015 1459   LEUKOCYTESUR SMALL (A) 10/25/2015 1459   Sepsis Labs: @LABRCNTIP (procalcitonin:4,lacticidven:4) ) Recent Results (from the past 240 hour(s))  Urine culture     Status: Abnormal   Collection Time: 10/17/15  6:19 PM  Result Value Ref Range Status   Specimen Description URINE, RANDOM  Final   Special Requests NONE  Final   Culture >=100,000 COLONIES/mL ESCHERICHIA COLI (A)  Final   Report Status 10/20/2015 FINAL  Final   Organism ID,  Bacteria ESCHERICHIA COLI (A)  Final      Susceptibility   Escherichia coli - MIC*    AMPICILLIN 4 SENSITIVE Sensitive     CEFAZOLIN <=4 SENSITIVE Sensitive     CEFTRIAXONE <=1 SENSITIVE Sensitive  CIPROFLOXACIN <=0.25 SENSITIVE Sensitive     GENTAMICIN <=1 SENSITIVE Sensitive     IMIPENEM <=0.25 SENSITIVE Sensitive     NITROFURANTOIN <=16 SENSITIVE Sensitive     TRIMETH/SULFA <=20 SENSITIVE Sensitive     AMPICILLIN/SULBACTAM <=2 SENSITIVE Sensitive     PIP/TAZO <=4 SENSITIVE Sensitive     Extended ESBL NEGATIVE Sensitive     * >=100,000 COLONIES/mL ESCHERICHIA COLI  MRSA PCR Screening     Status: Abnormal   Collection Time: 10/17/15 11:15 PM  Result Value Ref Range Status   MRSA by PCR POSITIVE (A) NEGATIVE Final    Comment:        The GeneXpert MRSA Assay (FDA approved for NASAL specimens only), is one component of a comprehensive MRSA colonization surveillance program. It is not intended to diagnose MRSA infection nor to guide or monitor treatment for MRSA infections. RESULT CALLED TO, READ BACK BY AND VERIFIED WITH: R.CORCORAN,RN 0238 10/18/15 WS[facility]A   C difficile quick scan w PCR reflex     Status: None   Collection Time: 10/18/15  5:19 PM  Result Value Ref Range Status   C Diff antigen NEGATIVE NEGATIVE Final   C Diff toxin NEGATIVE NEGATIVE Final   C Diff interpretation No C. difficile detected.  Final  Gastrointestinal Panel by PCR , Stool     Status: None   Collection Time: 10/18/15  5:19 PM  Result Value Ref Range Status   Campylobacter species NOT DETECTED NOT DETECTED Final   Plesimonas shigelloides NOT DETECTED NOT DETECTED Final   Salmonella species NOT DETECTED NOT DETECTED Final   Yersinia enterocolitica NOT DETECTED NOT DETECTED Final   Vibrio species NOT DETECTED NOT DETECTED Final   Vibrio cholerae NOT DETECTED NOT DETECTED Final   Enteroaggregative E coli (EAEC) NOT DETECTED NOT DETECTED Final   Enteropathogenic E coli (EPEC) NOT DETECTED NOT  DETECTED Final   Enterotoxigenic E coli (ETEC) NOT DETECTED NOT DETECTED Final   Shiga like toxin producing E coli (STEC) NOT DETECTED NOT DETECTED Final   E. coli O157 NOT DETECTED NOT DETECTED Final   Shigella/Enteroinvasive E coli (EIEC) NOT DETECTED NOT DETECTED Final   Cryptosporidium NOT DETECTED NOT DETECTED Final   Cyclospora cayetanensis NOT DETECTED NOT DETECTED Final   Entamoeba histolytica NOT DETECTED NOT DETECTED Final   Giardia lamblia NOT DETECTED NOT DETECTED Final   Adenovirus F40/41 NOT DETECTED NOT DETECTED Final   Astrovirus NOT DETECTED NOT DETECTED Final   Norovirus GI/GII NOT DETECTED NOT DETECTED Final   Rotavirus A NOT DETECTED NOT DETECTED Final   Sapovirus (I, II, IV, and V) NOT DETECTED NOT DETECTED Final     Radiological Exams on Admission: No results found.   EKG:  Not done in ED, will get one.   Assessment/Plan Principal Problem:   Urinary retention Active Problems:   Type 2 diabetes mellitus with vascular disease ([place])   Hyperlipidemia   Essential hypertension   Atrial fibrillation (HCC)   CAD (coronary artery disease)   Dementia   Hypokalemia   Urinary retention: Due to dementia, patient keeps putting out his Foley catheter repeatedly. Urology was consulted Urology, Dr. Louis Meckel, patient in needs TURP procedure tomorrow. -will admit to tele bed as inpt -insert Foley cath -NPO after MN -INR/PTT/type & screen -f/u Dr. Carlton Adam recommendation -F/u UA  DM-II: Last A1c 6.2 on 07/05/07, well controled. Patient is taking glipizide at home -SSI -Check A1c  HLD: Last LDL was 63 on 07/06/07 -Continue home medications: Lipitor  Atrial Fibrillation:  CHA2DS2-VASc Score is 6, needs oral anticoagulation. Patient is not on at home possibly due to dementia and high risk of fall. Heart rate is well controlled. -Continue metoprolol  HTN: -continue amlodipine, metoprolol  GERD: -Protonix  CAD (coronary artery disease): No CP -Dr. Louis Meckel  requested to hold aspirin and the Plavix -Continue metoprolol and Lipitor  Dementia: Continue Namenda and donepezil  Hypokalemia: K=3.2  on admission. - Repleted - Check Mg level   DVT ppx: SCD Code Status: Full code Family Communication:  Yes, patient's  lady friend, Oretha Ellis [phone]  at bed side Disposition Plan:  Anticipate discharge back to previous home environment Consults called:  Urology, Dr. Louis Meckel Admission status:  Inpatient/tele     Date of Service 10/27/2015    Ivor Costa [facility] Pager [phone]  If 7PM-7AM, please contact night-coverage [email or web address] Password [facility] 10/27/2015, 3:57 PM

## 2015-10-27 NOTE — H&P (Signed)
82 year old male with a history of Alzheimer's disease and advanced dementia who recently was admitted to the hospital for urinary retention and a urinary tract infection.  He was discharged approximately 1 week ago with a Foley catheter.  Since being discharged the patient has had no less than 5 trips back to the emergency department for removing his Foley catheter on his own and inability to void.  The patient's family is unable to care for him and monitor him closely enough to prevent this from happening.  There are no fevers or chills.  He denies any dysuria or gross hematuria.  His most recent Foley catheter removal was yesterday evening.  He has not voided since.  In the emergency department his PVR was 835.  The patient lives in independent living facility with his wife who is also disabled.  He is brought to the emergency room by his family friend.  No current facility-administered medications on file prior to encounter.    Current Outpatient Prescriptions on File Prior to Encounter  Medication Sig Dispense Refill  . ALPRAZolam (XANAX) 0.25 MG tablet Take 0.25 mg by mouth 2 (two) times daily.     [name] amLODipine (NORVASC) 5 MG tablet Take 2.5 mg by mouth daily.     [name] aspirin 81 MG tablet Take 81 mg by mouth daily.    [name] atorvastatin (LIPITOR) 20 MG tablet Take 20 mg by mouth every evening.     . cefUROXime (CEFTIN) 500 MG tablet Take 1 tablet (500 mg total) by mouth 2 (two) times daily with a meal. 10 tablet 0  . donepezil (ARICEPT) 23 MG TABS tablet Take 1 tablet (23 mg total) by mouth at bedtime. 30 tablet 0  . ferrous sulfate 325 (65 FE) MG tablet Take 325 mg by mouth 2 (two) times daily.    [name] glipiZIDE (GLUCOTROL) 10 MG tablet Take 10 mg by mouth daily.  5  . HYDROcodone-acetaminophen (NORCO/VICODIN) 5-325 MG per tablet Take 1 tablet by mouth every 6 (six) hours as needed for moderate pain or severe pain.     . isosorbide mononitrate (IMDUR) 30 MG 24 hr tablet Take 30 mg by mouth daily.     . memantine (NAMENDA) 10 MG tablet Take 10 mg by mouth 2 (two) times daily.    . metoprolol succinate (TOPROL-XL) 25 MG 24 hr tablet Take 1 tablet (25 mg total) by mouth daily. Take with or immediately following a meal. 30 tablet 3  . metroNIDAZOLE (FLAGYL) 500 MG tablet Take 1 tablet (500 mg total) by mouth 3 (three) times daily. (Patient taking differently: Take 500 mg by mouth 3 (three) times daily. ABT Start Date 10/21/15 & End Date 10/26/15.) 15 tablet 0  . ondansetron (ZOFRAN-ODT) 8 MG disintegrating tablet DIS 1/2 T PO Q 8 H PRF NAUSEA  0  . pantoprazole (PROTONIX) 40 MG tablet Take 40 mg by mouth daily.    . pramipexole (MIRAPEX) 0.125 MG tablet Take 2 tablets (0.25 mg total) by mouth every evening. Two hours before bedtime 90 tablet 3  . sertraline (ZOLOFT) 50 MG tablet Take 50 mg by mouth daily.    . tamsulosin (FLOMAX) 0.4 MG CAPS capsule Take 1 capsule (0.4 mg total) by mouth daily. 30 capsule 0   Past Medical History:  Diagnosis Date  . Anemia 06/2012  . Atrial fibrillation ([place])   . BPH (benign prostatic hyperplasia)    Followed by Dr. Matilde Sprang  . Brady-tachy syndrome ([place])    04/17/2009 MDT pacer Enrhythmatr  fib  . CAD (coronary artery disease)   . Dementia in Alzheimer's disease   . Depression   . Diabetes ([place])   . Diverticulosis 02/26/2010   Sigmoid colon  . Duodenitis 02/26/2010  . Elevated PSA   . Episodic VTach, pacer    Followed by Dr. Avon Gully  . Fall 03/2014   elbow fluid   . Gastritis 02/26/2010   moderate  . Hiatal hernia 02/26/2010  . Hyperlipidemia   . Hypertension   . Memory loss   . Microalbuminuria   . Urinary incontinence    Past Surgical History:  Procedure Laterality Date  . APPENDECTOMY  1948  . CARDIAC CATHETERIZATION  07/07/2007   mild to mod. ca+ w/narrowing of 20% ostium of second diagonal,diffuse 80% stenosis small caliber infer. branch,20% mid LAD,60-70% narrowing mid atrioventricular groove CX,mild 20-30% RCA  . CAROTID DOPPLER   10/01/2006   0-49% right bulb,prox,ECA & prox ICA,0-49% left subclavian bulb & prox ICA,right & left ICA abn. resistance  . Cataracts    . CHOLECYSTECTOMY    . NM MYOVIEW LTD  10/01/2010   No ischemia  . PERMANENT PACEMAKER INSERTION  04/17/2009   MDT Enrhythm   PE: Mucosal membranes are moist Head is normocephalic, hearing normal nonlabored breathing Patient's extremities are well-perfused, no peripheral edema Her abdomen is flat There is no CVA tenderness There are no skin lesions or rashes  Penis demonstrates severe phimosis with small opening Mood is appropriate, affect is bright No appreciable lymphadenopathy Gait is normal, no gross neurological deficits   Recent Labs  10/25/15 1533  WBC 10.2  HGB 14.0  HCT 39.5    Recent Labs  10/25/15 1533  NA 139  K 3.2*  CL 105  CO2 27  GLUCOSE 128*  BUN 17  CREATININE 1.39*  CALCIUM 8.1*   No results for input(s): LABPT, INR in the last 72 hours. No results for input(s): PSA in the last 72 hours. No results for input(s): LABURIN in the last 72 hours. Results for orders placed or performed during the hospital encounter of 10/17/15  Urine culture     Status: Abnormal   Collection Time: 10/17/15  6:19 PM  Result Value Ref Range Status   Specimen Description URINE, RANDOM  Final   Special Requests NONE  Final   Culture >=100,000 COLONIES/mL ESCHERICHIA COLI (A)  Final   Report Status 10/20/2015 FINAL  Final   Organism ID, Bacteria ESCHERICHIA COLI (A)  Final      Susceptibility   Escherichia coli - MIC*    AMPICILLIN 4 SENSITIVE Sensitive     CEFAZOLIN <=4 SENSITIVE Sensitive     CEFTRIAXONE <=1 SENSITIVE Sensitive     CIPROFLOXACIN <=0.25 SENSITIVE Sensitive     GENTAMICIN <=1 SENSITIVE Sensitive     IMIPENEM <=0.25 SENSITIVE Sensitive     NITROFURANTOIN <=16 SENSITIVE Sensitive     TRIMETH/SULFA <=20 SENSITIVE Sensitive     AMPICILLIN/SULBACTAM <=2 SENSITIVE Sensitive     PIP/TAZO <=4 SENSITIVE Sensitive      Extended ESBL NEGATIVE Sensitive     * >=100,000 COLONIES/mL ESCHERICHIA COLI  MRSA PCR Screening     Status: Abnormal   Collection Time: 10/17/15 11:15 PM  Result Value Ref Range Status   MRSA by PCR POSITIVE (A) NEGATIVE Final    Comment:        The GeneXpert MRSA Assay (FDA approved for NASAL specimens only), is one component of a comprehensive MRSA colonization surveillance program. It is not intended to diagnose  MRSA infection nor to guide or monitor treatment for MRSA infections. RESULT CALLED TO, READ BACK BY AND VERIFIED WITH: R.CORCORAN,RN 0238 10/18/15 [facility]   C difficile quick scan w PCR reflex     Status: None   Collection Time: 10/18/15  5:19 PM  Result Value Ref Range Status   C Diff antigen NEGATIVE NEGATIVE Final   C Diff toxin NEGATIVE NEGATIVE Final   C Diff interpretation No C. difficile detected.  Final  Gastrointestinal Panel by PCR , Stool     Status: None   Collection Time: 10/18/15  5:19 PM  Result Value Ref Range Status   Campylobacter species NOT DETECTED NOT DETECTED Final   Plesimonas shigelloides NOT DETECTED NOT DETECTED Final   Salmonella species NOT DETECTED NOT DETECTED Final   Yersinia enterocolitica NOT DETECTED NOT DETECTED Final   Vibrio species NOT DETECTED NOT DETECTED Final   Vibrio cholerae NOT DETECTED NOT DETECTED Final   Enteroaggregative E coli (EAEC) NOT DETECTED NOT DETECTED Final   Enteropathogenic E coli (EPEC) NOT DETECTED NOT DETECTED Final   Enterotoxigenic E coli (ETEC) NOT DETECTED NOT DETECTED Final   Shiga like toxin producing E coli (STEC) NOT DETECTED NOT DETECTED Final   E. coli O157 NOT DETECTED NOT DETECTED Final   Shigella/Enteroinvasive E coli (EIEC) NOT DETECTED NOT DETECTED Final   Cryptosporidium NOT DETECTED NOT DETECTED Final   Cyclospora cayetanensis NOT DETECTED NOT DETECTED Final   Entamoeba histolytica NOT DETECTED NOT DETECTED Final   Giardia lamblia NOT DETECTED NOT DETECTED Final   Adenovirus F40/41  NOT DETECTED NOT DETECTED Final   Astrovirus NOT DETECTED NOT DETECTED Final   Norovirus GI/GII NOT DETECTED NOT DETECTED Final   Rotavirus A NOT DETECTED NOT DETECTED Final   Sapovirus (I, II, IV, and V) NOT DETECTED NOT DETECTED Final    No images available  Imp/REc: : urinary retention secondary to bladder outlet obstruction from an enlarged prostate.  The patient has been previously scheduled to have a TURP and a circumcision by Dr. Jeffie Pollock.  However, his clinical situation dictates that this be done significantly sooner because of his dementia and Continued efforts to dismantle his Foley catheter and remove it on his own without a full understanding of the repercussions.  He has been cleared by cardiology to undergo the surgery.  He is no longer taking anti-coagulation medications except for a baby aspirin.  As such, I recommended admission to the hospital service and planned for a TURP and circumcision tomorrow morning.  A Foley catheter should be placed in the emergency department given his post void residual of 835.  However if he gets removed in the middle of the night it should remain out until we are able to perform his surgery which will be scheduled at approximately 9:30 tomorrow a.m.

## 2015-10-27 NOTE — ED Triage Notes (Signed)
Per caregiver, patient cut tubing to foley last night and later removed the rest of catheter-has scheduled appointment for surgery tomorrow to have urostomy placed-

## 2015-10-27 NOTE — ED Notes (Signed)
Bed: [facility]PT1 Expected date:  Expected time:  Means of arrival:  Comments: 

## 2015-10-27 NOTE — ED Notes (Signed)
16:10 PT CAN GO TO FLOOR. 

## 2015-10-27 NOTE — ED Provider Notes (Signed)
[facility] DEPT Provider Note   CSN: [id number] Arrival date & time: 10/27/15  1401     History   Chief Complaint Chief Complaint  Patient presents with  . foley dislodged    HPI Jon Davis is a 82 y.o. male.  HPI low 5 caveat due to dementia  61 YOM with history of Dementia presents today with urinary retention and Foley catheter issues. Patient continues to remove Foley catheter with scissors and fingernail clippers. He's been seen numerous times in the ED for similar presentation. Patient was admitted to the hospital and discharged on 10/21/2015 secondary to diverticulitis with acute renal failure in urinary tract infection. Patient provides no significant information.    Past Medical History:  Diagnosis Date  . Anemia 06/2012  . Atrial fibrillation ([place])   . BPH (benign prostatic hyperplasia)    Followed by Dr. Matilde Sprang  . Brady-tachy syndrome ([place])    04/17/2009 MDT pacer Enrhythmatr fib  . CAD (coronary artery disease)   . Dementia in Alzheimer's disease   . Depression   . Diabetes ([place])   . Diverticulosis 02/26/2010   Sigmoid colon  . Duodenitis 02/26/2010  . Elevated PSA   . Episodic VTach, pacer    Followed by Dr. Avon Gully  . Fall 03/2014   elbow fluid   . Gastritis 02/26/2010   moderate  . Hiatal hernia 02/26/2010  . Hyperlipidemia   . Hypertension   . Memory loss   . Microalbuminuria   . Urinary incontinence     Patient Active Problem List   Diagnosis Date Noted  . Hypokalemia 10/27/2015  . UTI (urinary tract infection) 10/21/2015  . Urinary retention 10/21/2015  . Sigmoid diverticulitis 10/17/2015  . Dementia 10/17/2015  . ARF (acute renal failure) ([place]) 10/17/2015  . Diarrhea 10/17/2015  . AKI (acute kidney injury) ([place])   . Cerebral degeneration, unspecified 05/26/2013  . Restless legs syndrome (RLS) 05/26/2013  . CAD (coronary artery disease) 07/31/2012  . Pacemaker 07/31/2012  . Nonsustained ventricular tachycardia ([place])  07/31/2012  . Aortic atherosclerosis ([place]) 07/31/2012  . Microcytic anemia 06/29/2012  . Long term (current) use of anticoagulants 04/26/2012  . THYROID NODULE 01/23/2010  . Type 2 diabetes mellitus with vascular disease ([place]) 01/23/2010  . Hyperlipidemia 01/23/2010  . ANEMIA, IRON DEFICIENCY 01/23/2010  . Memory loss 01/23/2010  . Essential hypertension 01/23/2010  . Atrial fibrillation ([place]) 01/23/2010  . CVA 01/23/2010  . ESOPHAGEAL STRICTURE 01/23/2010  . HIATAL HERNIA 01/23/2010  . DIVERTICULOSIS, COLON 01/23/2010  . HEMOCCULT POSITIVE STOOL 01/23/2010    Past Surgical History:  Procedure Laterality Date  . APPENDECTOMY  1948  . CARDIAC CATHETERIZATION  07/07/2007   mild to mod. ca+ w/narrowing of 20% ostium of second diagonal,diffuse 80% stenosis small caliber infer. branch,20% mid LAD,60-70% narrowing mid atrioventricular groove CX,mild 20-30% RCA  . CAROTID DOPPLER  10/01/2006   0-49% right bulb,prox,ECA & prox ICA,0-49% left subclavian bulb & prox ICA,right & left ICA abn. resistance  . Cataracts    . CHOLECYSTECTOMY    . NM MYOVIEW LTD  10/01/2010   No ischemia  . PERMANENT PACEMAKER INSERTION  04/17/2009   MDT Enrhythm     Home Medications    Prior to Admission medications   Medication Sig Start Date End Date Taking? Authorizing Provider  ALPRAZolam (XANAX) 0.25 MG tablet Take 0.25 mg by mouth 2 (two) times daily.     Historical Provider, MD  amLODipine (NORVASC) 5 MG tablet Take 2.5 mg by mouth daily.  Historical Provider, MD  aspirin 81 MG tablet Take 81 mg by mouth daily.    Historical Provider, MD  atorvastatin (LIPITOR) 20 MG tablet Take 20 mg by mouth every evening.     Historical Provider, MD  cefUROXime (CEFTIN) 500 MG tablet Take 1 tablet (500 mg total) by mouth 2 (two) times daily with a meal. 10/21/15   Debbe Odea, MD  donepezil (ARICEPT) 23 MG TABS tablet Take 1 tablet (23 mg total) by mouth at bedtime. 02/21/15   Dennie Bible, NP  ferrous  sulfate 325 (65 FE) MG tablet Take 325 mg by mouth 2 (two) times daily.    Historical Provider, MD  glipiZIDE (GLUCOTROL) 10 MG tablet Take 10 mg by mouth daily. 04/21/14   Historical Provider, MD  HYDROcodone-acetaminophen (NORCO/VICODIN) 5-325 MG per tablet Take 1 tablet by mouth every 6 (six) hours as needed for moderate pain or severe pain.     Historical Provider, MD  isosorbide mononitrate (IMDUR) 30 MG 24 hr tablet Take 30 mg by mouth daily.    Historical Provider, MD  memantine (NAMENDA) 10 MG tablet Take 10 mg by mouth 2 (two) times daily.    Historical Provider, MD  metoprolol succinate (TOPROL-XL) 25 MG 24 hr tablet Take 1 tablet (25 mg total) by mouth daily. Take with or immediately following a meal. 09/13/15   Mihai Croitoru, MD  metroNIDAZOLE (FLAGYL) 500 MG tablet Take 1 tablet (500 mg total) by mouth 3 (three) times daily. Patient taking differently: Take 500 mg by mouth 3 (three) times daily. ABT Start Date 10/21/15 & End Date 10/26/15. 10/21/15   Debbe Odea, MD  ondansetron (ZOFRAN-ODT) 8 MG disintegrating tablet DIS 1/2 T PO Q 8 H PRF NAUSEA 10/20/15   Historical Provider, MD  pantoprazole (PROTONIX) 40 MG tablet Take 40 mg by mouth daily.    Historical Provider, MD  pramipexole (MIRAPEX) 0.125 MG tablet Take 2 tablets (0.25 mg total) by mouth every evening. Two hours before bedtime 06/29/14   Dennie Bible, NP  sertraline (ZOLOFT) 50 MG tablet Take 50 mg by mouth daily.    Historical Provider, MD  SSD 1 % cream Apply 1 application topically daily. APPLY 1 APPLICATION TO AFFECTED AREA ONCE DAILY FOR 14 DAYS 10/24/15   Historical Provider, MD  tamsulosin (FLOMAX) 0.4 MG CAPS capsule Take 1 capsule (0.4 mg total) by mouth daily. 10/21/15   Debbe Odea, MD    Family History Family History  Problem Relation Age of Onset  . Alzheimer's disease Mother     Deceased  . Heart disease Father     Deceased    Social History Social History  Substance Use Topics  . Smoking status:  Former Research scientist (life sciences)  . Smokeless tobacco: Former Systems developer    Quit date: 02/09/1974  . Alcohol use No     Allergies   Penicillins   Review of Systems Review of Systems  All other systems reviewed and are negative.   Physical Exam Updated Vital Signs BP 144/73 (BP Location: Left Arm)   Pulse 68   Temp 98.3 F (36.8 C) (Oral)   Resp 18   SpO2 97%   Physical Exam  Constitutional: He appears well-developed and well-nourished.  HENT:  Head: Normocephalic and atraumatic.  Eyes: Conjunctivae are normal. Pupils are equal, round, and reactive to light. Right eye exhibits no discharge. Left eye exhibits no discharge. No scleral icterus.  Neck: Normal range of motion. No JVD present. No tracheal deviation present.  Pulmonary/Chest: Effort  normal. No stridor.  Abdominal: Soft. He exhibits no distension. There is no tenderness.  Neurological: He is alert. Coordination normal.  Psychiatric: He has a normal mood and affect. His behavior is normal. Judgment and thought content normal.  Nursing note and vitals reviewed.    ED Treatments / Results  Labs (all labs ordered are listed, but only abnormal results are displayed) Labs Reviewed  BASIC METABOLIC PANEL - Abnormal; Notable for the following:       Result Value   Potassium 3.2 (*)    Glucose, Bld 170 (*)    Creatinine, Ser 1.31 (*)    Calcium 8.3 (*)    GFR calc non Af Amer 47 (*)    GFR calc Af Amer 55 (*)    All other components within normal limits  CBC  URINALYSIS, ROUTINE W REFLEX MICROSCOPIC (NOT AT [facility])  PROTIME-INR  APTT  TYPE AND SCREEN    EKG  EKG Interpretation None       Radiology No results found.  Procedures Procedures (including critical care time)  Medications Ordered in ED Medications  silver sulfADIAZINE (SILVADENE) 1 % cream 1 application (not administered)  ondansetron (ZOFRAN-ODT) disintegrating tablet 4 mg (not administered)  tamsulosin (FLOMAX) capsule 0.4 mg (not administered)  metoprolol  succinate (TOPROL-XL) 24 hr tablet 25 mg (not administered)  memantine (NAMENDA) tablet 10 mg (not administered)  donepezil (ARICEPT) tablet 23 mg (not administered)  pramipexole (MIRAPEX) tablet 0.25 mg (not administered)  ALPRAZolam (XANAX) tablet 0.25 mg (not administered)  atorvastatin (LIPITOR) tablet 20 mg (not administered)  ferrous sulfate tablet 325 mg (not administered)  HYDROcodone-acetaminophen (NORCO/VICODIN) 5-325 MG per tablet 1 tablet (not administered)  isosorbide mononitrate (IMDUR) 24 hr tablet 30 mg (not administered)  sertraline (ZOLOFT) tablet 50 mg (not administered)  pantoprazole (PROTONIX) EC tablet 40 mg (not administered)  amLODipine (NORVASC) tablet 2.5 mg (not administered)  potassium chloride 20 MEQ/15ML (10%) solution 40 mEq (not administered)  sodium chloride flush (NS) 0.9 % injection 3 mL (not administered)  acetaminophen (TYLENOL) tablet 650 mg (not administered)    Or  acetaminophen (TYLENOL) suppository 650 mg (not administered)  insulin aspart (novoLOG) injection 0-9 Units (not administered)  insulin aspart (novoLOG) injection 0-5 Units (not administered)     Initial Impression / Assessment and Plan / ED Course  I have reviewed the triage vital signs and the nursing notes.  Pertinent labs & imaging results that were available during my care of the patient were reviewed by me and considered in my medical decision making (see chart for details).  Clinical Course      Final Clinical Impressions(s) / ED Diagnoses   Final diagnoses:  Urinary retention   Labs:  Imaging:  Consults:  Therapeutics:  Discharge Meds:   Assessment/Plan:  82 year old male presents today with urinary retention. He continues to pull out his catheter, concerned as patient with a history of acute renal failure, urinary tract infection. Dr. Louis Meckel personally evaluated the patient here in the ED and recommended hospital admission with anticipated TURP tomorrow. Dr.  Louis Meckel spoke directly with Dr. Blaine Hamper, Dr. new was consult at who agreed to hospital admission. Patient afebrile nontoxic here in the ED.     New Prescriptions New Prescriptions   No medications on file     Okey Regal, PA-C 10/27/15 [place], DO 10/31/15 1510

## 2015-10-28 ENCOUNTER — Inpatient Hospital Stay (HOSPITAL_COMMUNITY): Payer: Medicare Other | Admitting: Anesthesiology

## 2015-10-28 ENCOUNTER — Encounter (HOSPITAL_COMMUNITY)
Admission: EM | Disposition: A | Discharge status: Home Health | Payer: Self-pay | Source: Home / Self Care | Attending: Internal Medicine

## 2015-10-28 ENCOUNTER — Encounter (HOSPITAL_COMMUNITY): Payer: Self-pay | Admitting: Anesthesiology

## 2015-10-28 DIAGNOSIS — Z531 Procedure and treatment not carried out because of patient's decision for reasons of belief and group pressure: Secondary | ICD-10-CM

## 2015-10-28 DIAGNOSIS — R339 Retention of urine, unspecified: Secondary | ICD-10-CM

## 2015-10-28 HISTORY — PX: CIRCUMCISION: SHX1350

## 2015-10-28 HISTORY — PX: TRANSURETHRAL RESECTION OF PROSTATE: SHX73

## 2015-10-28 LAB — TROPONIN I: Troponin I: <0.03 ng/mL (ref <0.03)

## 2015-10-28 LAB — BASIC METABOLIC PANEL WITH GFR
Anion gap: 6 (ref 5–15)
BUN: 13 mg/dL (ref 6–20)
CO2: 28 mmol/L (ref 22–32)
Calcium: 8.1 mg/dL — ABNORMAL LOW (ref 8.9–10.3)
Chloride: 106 mmol/L (ref 101–111)
Creatinine, Ser: 1.32 mg/dL — ABNORMAL HIGH (ref 0.61–1.24)
GFR calc Af Amer: 54 mL/min — ABNORMAL LOW
GFR calc non Af Amer: 47 mL/min — ABNORMAL LOW
Glucose, Bld: 178 mg/dL — ABNORMAL HIGH (ref 65–99)
Potassium: 3.5 mmol/L (ref 3.5–5.1)
Sodium: 140 mmol/L (ref 135–145)

## 2015-10-28 LAB — HEMOGLOBIN A1C
HEMOGLOBIN A1C: 7.1 % — AB (ref 4.8–5.6)
Mean Plasma Glucose: 157 mg/dL

## 2015-10-28 LAB — BASIC METABOLIC PANEL
Anion gap: 6 (ref 5–15)
BUN: 14 mg/dL (ref 6–20)
CALCIUM: 8.2 mg/dL — AB (ref 8.9–10.3)
CHLORIDE: 106 mmol/L (ref 101–111)
CO2: 30 mmol/L (ref 22–32)
CREATININE: 1.39 mg/dL — AB (ref 0.61–1.24)
GFR calc non Af Amer: 44 mL/min — ABNORMAL LOW (ref >60)
GFR, EST AFRICAN AMERICAN: 51 mL/min — AB (ref >60)
Glucose, Bld: 143 mg/dL — ABNORMAL HIGH (ref 65–99)
Potassium: 3.1 mmol/L — ABNORMAL LOW (ref 3.5–5.1)
Sodium: 142 mmol/L (ref 135–145)

## 2015-10-28 LAB — GLUCOSE, CAPILLARY
GLUCOSE-CAPILLARY: 176 mg/dL — AB (ref 65–99)
GLUCOSE-CAPILLARY: 138 mg/dL — AB (ref 65–99)
Glucose-Capillary: 162 mg/dL — ABNORMAL HIGH (ref 65–99)
Glucose-Capillary: 143 mg/dL — ABNORMAL HIGH (ref 65–99)

## 2015-10-28 LAB — CBC
HCT: 37.4 % — ABNORMAL LOW (ref 39.0–52.0)
HCT: 40.5 % (ref 39.0–52.0)
Hemoglobin: 12.6 g/dL — ABNORMAL LOW (ref 13.0–17.0)
Hemoglobin: 13.5 g/dL (ref 13.0–17.0)
MCH: 30.4 pg (ref 26.0–34.0)
MCH: 30.2 pg (ref 26.0–34.0)
MCHC: 33.7 g/dL (ref 30.0–36.0)
MCHC: 33.3 g/dL (ref 30.0–36.0)
MCV: 90.1 fL (ref 78.0–100.0)
MCV: 90.6 fL (ref 78.0–100.0)
PLATELETS: 298 10*3/uL (ref 150–400)
Platelets: 320 K/uL (ref 150–400)
RBC: 4.15 MIL/uL — AB (ref 4.22–5.81)
RBC: 4.47 MIL/uL (ref 4.22–5.81)
RDW: 13.7 % (ref 11.5–15.5)
RDW: 13.5 % (ref 11.5–15.5)
WBC: 9.6 10*3/uL (ref 4.0–10.5)
WBC: 10.1 K/uL (ref 4.0–10.5)

## 2015-10-28 LAB — MAGNESIUM: Magnesium: 1.6 mg/dL — ABNORMAL LOW (ref 1.7–2.4)

## 2015-10-28 SURGERY — CIRCUMCISION, ADULT
Anesthesia: General | Site: Penis

## 2015-10-28 MED ORDER — MAGNESIUM SULFATE 2 GM/50ML IV SOLN
2.0000 g | Freq: Once | INTRAVENOUS | Status: AC
  Administered 2015-10-28: 2 g via INTRAVENOUS
  Filled 2015-10-28: qty 50

## 2015-10-28 MED ORDER — POTASSIUM CHLORIDE CRYS ER 20 MEQ PO TBCR
40.0000 meq | EXTENDED_RELEASE_TABLET | Freq: Once | ORAL | Status: AC
  Administered 2015-10-28: 40 meq via ORAL
  Filled 2015-10-28: qty 2

## 2015-10-28 MED ORDER — CIPROFLOXACIN IN D5W 400 MG/200ML IV SOLN
400.0000 mg | INTRAVENOUS | Status: AC
  Administered 2015-10-28: 400 mg via INTRAVENOUS

## 2015-10-28 MED ORDER — ROCURONIUM BROMIDE 100 MG/10ML IV SOLN
INTRAVENOUS | Status: DC | PRN
  Administered 2015-10-28: 10 mg via INTRAVENOUS
  Administered 2015-10-28: 50 mg via INTRAVENOUS

## 2015-10-28 MED ORDER — CIPROFLOXACIN IN D5W 400 MG/200ML IV SOLN
INTRAVENOUS | Status: AC
  Filled 2015-10-28: qty 200

## 2015-10-28 MED ORDER — BACITRACIN ZINC 500 UNIT/GM EX OINT
TOPICAL_OINTMENT | CUTANEOUS | Status: AC
  Filled 2015-10-28: qty 28.35

## 2015-10-28 MED ORDER — BUPIVACAINE HCL (PF) 0.5 % IJ SOLN
INTRAMUSCULAR | Status: DC | PRN
  Administered 2015-10-28: 20 mL

## 2015-10-28 MED ORDER — BUPIVACAINE HCL (PF) 0.5 % IJ SOLN
INTRAMUSCULAR | Status: AC
  Filled 2015-10-28: qty 30

## 2015-10-28 MED ORDER — MEPERIDINE HCL 25 MG/ML IJ SOLN: 6.2500 mg | INTRAMUSCULAR | Status: DC | PRN

## 2015-10-28 MED ORDER — FENTANYL CITRATE (PF) 100 MCG/2ML IJ SOLN: 25.0000 ug | INTRAMUSCULAR | Status: DC | PRN

## 2015-10-28 MED ORDER — 0.9 % SODIUM CHLORIDE (POUR BTL) OPTIME
TOPICAL | Status: DC | PRN
  Administered 2015-10-28: 1000 mL
  Administered 2015-10-28: 21000 mL

## 2015-10-28 MED ORDER — ESMOLOL HCL 100 MG/10ML IV SOLN
INTRAVENOUS | Status: DC | PRN
  Administered 2015-10-28 (×5): 20 mg via INTRAVENOUS

## 2015-10-28 MED ORDER — LIDOCAINE HCL 2 % EX GEL
CUTANEOUS | Status: AC
  Filled 2015-10-28: qty 5

## 2015-10-28 MED ORDER — BELLADONNA ALKALOIDS-OPIUM 16.2-60 MG RE SUPP
RECTAL | Status: DC | PRN
  Administered 2015-10-28: 1 via RECTAL

## 2015-10-28 MED ORDER — SUGAMMADEX SODIUM 200 MG/2ML IV SOLN
INTRAVENOUS | Status: DC | PRN
  Administered 2015-10-28: 200 mg via INTRAVENOUS

## 2015-10-28 MED ORDER — FENTANYL CITRATE (PF) 100 MCG/2ML IJ SOLN
INTRAMUSCULAR | Status: AC
  Filled 2015-10-28: qty 2

## 2015-10-28 MED ORDER — METOCLOPRAMIDE HCL 5 MG/ML IJ SOLN: 10.0000 mg | Freq: Once | INTRAMUSCULAR | Status: DC | PRN

## 2015-10-28 MED ORDER — ESMOLOL HCL 100 MG/10ML IV SOLN
INTRAVENOUS | Status: AC
  Filled 2015-10-28: qty 10

## 2015-10-28 MED ORDER — LACTATED RINGERS IV SOLN
INTRAVENOUS | Status: DC | PRN
  Administered 2015-10-28: 09:00:00 via INTRAVENOUS

## 2015-10-28 MED ORDER — ONDANSETRON HCL 4 MG/2ML IJ SOLN
INTRAMUSCULAR | Status: DC | PRN
  Administered 2015-10-28: 4 mg via INTRAVENOUS

## 2015-10-28 MED ORDER — ONDANSETRON HCL 4 MG/2ML IJ SOLN
INTRAMUSCULAR | Status: AC
  Filled 2015-10-28: qty 2

## 2015-10-28 MED ORDER — PROPOFOL 10 MG/ML IV BOLUS
INTRAVENOUS | Status: DC | PRN
  Administered 2015-10-28: 90 mg via INTRAVENOUS

## 2015-10-28 MED ORDER — SUGAMMADEX SODIUM 200 MG/2ML IV SOLN
INTRAVENOUS | Status: AC
  Filled 2015-10-28: qty 2

## 2015-10-28 MED ORDER — LIDOCAINE HCL (CARDIAC) 20 MG/ML IV SOLN
INTRAVENOUS | Status: DC | PRN
  Administered 2015-10-28: 50 mg via INTRAVENOUS

## 2015-10-28 MED ORDER — SODIUM CHLORIDE 0.9 % IR SOLN
3000.0000 mL | Status: DC
  Administered 2015-10-28: 3000 mL

## 2015-10-28 MED ORDER — BELLADONNA ALKALOIDS-OPIUM 16.2-60 MG RE SUPP
RECTAL | Status: AC
  Filled 2015-10-28: qty 1

## 2015-10-28 MED ORDER — FENTANYL CITRATE (PF) 100 MCG/2ML IJ SOLN
INTRAMUSCULAR | Status: DC | PRN
  Administered 2015-10-28 (×2): 50 ug via INTRAVENOUS

## 2015-10-28 MED ORDER — LIDOCAINE HCL 1 % IJ SOLN
INTRAMUSCULAR | Status: AC
  Filled 2015-10-28: qty 80

## 2015-10-28 SURGICAL SUPPLY — 45 items
BAG URINE DRAINAGE (UROLOGICAL SUPPLIES) ×3 IMPLANT
BAG URO CATCHER STRL LF (MISCELLANEOUS) ×3 IMPLANT
BANDAGE COBAN STERILE 2 (GAUZE/BANDAGES/DRESSINGS) ×3 IMPLANT
BLADE SURG 15 STRL LF DISP TIS (BLADE) ×1 IMPLANT
BLADE SURG 15 STRL SS (BLADE) ×2
BNDG CONFORM 2 STRL LF (GAUZE/BANDAGES/DRESSINGS) ×3 IMPLANT
CATH FOLEY 2WAY SLVR 30CC 24FR (CATHETERS) IMPLANT
CATH FOLEY 3WAY 30CC 22FR (CATHETERS) ×3 IMPLANT
CATH FOLEY 3WAY 30CC 24FR (CATHETERS)
CATH URTH STD 24FR FL 3W 2 (CATHETERS) IMPLANT
COUNTER NEEDLE 20 DBL MAG RED (NEEDLE) ×3 IMPLANT
COVER SURGICAL LIGHT HANDLE (MISCELLANEOUS) ×3 IMPLANT
DECANTER SPIKE VIAL GLASS SM (MISCELLANEOUS) IMPLANT
DRAPE LAPAROTOMY T 102X78X121 (DRAPES) ×3 IMPLANT
ELECT NEEDLE TIP 2.8 STRL (NEEDLE) ×6 IMPLANT
ELECT PENCIL ROCKER SW 15FT (MISCELLANEOUS) ×6 IMPLANT
ELECT REM PT RETURN 9FT ADLT (ELECTROSURGICAL) ×3
ELECTRODE REM PT RTRN 9FT ADLT (ELECTROSURGICAL) ×1 IMPLANT
EVACUATOR ELLICK (MISCELLANEOUS) ×3 IMPLANT
EVACUATOR MICROVAS BLADDER (UROLOGICAL SUPPLIES) ×3 IMPLANT
GAUZE SPONGE 4X4 12PLY STRL (GAUZE/BANDAGES/DRESSINGS) ×3 IMPLANT
GAUZE SPONGE 4X4 16PLY XRAY LF (GAUZE/BANDAGES/DRESSINGS) ×6 IMPLANT
GLOVE BIO SURGEON STRL SZ7.5 (GLOVE) ×3 IMPLANT
GLOVE BIOGEL M STRL SZ7.5 (GLOVE) ×3 IMPLANT
GOWN STRL REUS W/ TWL XL LVL3 (GOWN DISPOSABLE) ×1 IMPLANT
GOWN STRL REUS W/TWL LRG LVL3 (GOWN DISPOSABLE) ×6 IMPLANT
GOWN STRL REUS W/TWL XL LVL3 (GOWN DISPOSABLE) ×5 IMPLANT
HOLDER FOLEY CATH W/STRAP (MISCELLANEOUS) ×3 IMPLANT
KIT BASIN OR (CUSTOM PROCEDURE TRAY) ×3 IMPLANT
LOOP CUT BIPOLAR 24F LRG (ELECTROSURGICAL) ×3 IMPLANT
MANIFOLD NEPTUNE II (INSTRUMENTS) ×3 IMPLANT
NEEDLE HYPO 25X1 1.5 SAFETY (NEEDLE) ×6 IMPLANT
NS IRRIG 1000ML POUR BTL (IV SOLUTION) ×3 IMPLANT
PACK BASIC VI WITH GOWN DISP (CUSTOM PROCEDURE TRAY) ×3 IMPLANT
PACK CYSTO (CUSTOM PROCEDURE TRAY) ×3 IMPLANT
SET ASPIRATION TUBING (TUBING) ×3 IMPLANT
SUT CHROMIC 4 0 P 3 18 (SUTURE) ×3 IMPLANT
SUT CHROMIC 4 0 PS 2 18 (SUTURE) IMPLANT
SYR 30ML LL (SYRINGE) ×3 IMPLANT
SYR CONTROL 10ML LL (SYRINGE) ×3 IMPLANT
SYRINGE IRR TOOMEY STRL 70CC (SYRINGE) ×3 IMPLANT
TOWEL OR 17X26 10 PK STRL BLUE (TOWEL DISPOSABLE) ×3 IMPLANT
TUBING CONNECTING 10 (TUBING) ×2 IMPLANT
TUBING CONNECTING 10' (TUBING) ×1
WATER STERILE IRR 1500ML POUR (IV SOLUTION) IMPLANT

## 2015-10-28 NOTE — Anesthesia Procedure Notes (Signed)
Procedure Name: Intubation Date/Time: 10/28/2015 10:01 AM Performed by: Takiera Mayo, Virgel Gess Pre-anesthesia Checklist: Patient identified, Emergency Drugs available, Suction available, Patient being monitored and Timeout performed Patient Re-evaluated:Patient Re-evaluated prior to inductionOxygen Delivery Method: Circle system utilized Preoxygenation: Pre-oxygenation with 100% oxygen Intubation Type: IV induction Ventilation: Mask ventilation without difficulty Laryngoscope Size: Mac and 4 Grade View: Grade II Tube type: Oral Tube size: 7.5 mm Number of attempts: 1 Airway Equipment and Method: Stylet Placement Confirmation: ETT inserted through vocal cords under direct vision,  positive ETCO2,  CO2 detector and breath sounds checked- equal and bilateral Secured at: 22 cm Tube secured with: Tape Dental Injury: Teeth and Oropharynx as per pre-operative assessment

## 2015-10-28 NOTE — Progress Notes (Signed)
Pt c/o chest pain, unable to provide description, on call provider Dr. Maudie Mercury was made aware and order CT of the chest, EKG, and cardiac enzymes. Pt returned for CT scan and denied any further discomfort, DR. Osei-Bonsei  phoned to check on status of patient, and stated to continue to monitor patient and cardiac enz

## 2015-10-28 NOTE — Progress Notes (Signed)
Patient is status post dorsal slit procedure for his phimosis and TURP.  Postop care for his dorsal slit is to apply bacitracin 3 times daily to the area.  The patient will remain on continuous bladder irrigation overnight. We will likely leave his Foley catheter and an extra day and have a voiding trial for him on Tuesday morning.  The patient should be evaluated for skilled nursing facilities if necessary.

## 2015-10-28 NOTE — Op Note (Signed)
Preoperative diagnosis:  1. Bladder outlet obstruction 2. Urinary retention 3. Phimosis  Postoperative diagnosis:  1. same   Procedure:  1. Dorsal Slit 2. Transurethral resection of prostate  Surgeon: Ardis Hughs, MD   Anesthesia: General   Complications: None   Intraoperative findings: The patient was noted to have tight adhesive/fibrotic foreskin. There is also adhesions of the foreskin to the glans penis. Cystoscopy demonstrated  a normal appearing bladder mucosa without tumor/stones/or abnormalities.  UOs were orthopic.  Prostate demonstrated a large median lobe with lateral lobe obstruction.  EBL: 150cc   Specimens: Prostate chips  Indication: Jon Davis is a 82 y.o. patient with bladder outlet obstruction/urinary retention/obstructive voiding symptoms. In addition, the patient had severe phimosis making catheterization difficult. The patient had been in and out of the emergency room numerous times over the past week because of difficulty with his Foley catheter including removing it several times on his own. He has dementia and little insight into his problem. Prior to that he was admitted to the hospital with a urinary tract infection and urinary retention. Given the difficulty associated with this catheter and his urinary retention and we opted to proceed to the operating room over the weekend on a urgent basis for the above procedures.  After reviewing the management options for treatment, he elected to proceed with the above surgical procedure(s). We have discussed the potential benefits and risks of the procedure, side effects of the proposed treatment, the likelihood of the patient achieving the goals of the procedure, and any potential problems that might occur during the procedure or recuperation. Informed consent has been obtained.  Description of procedure:  The patient was taken to the operating room and general anesthesia was induced. The patient was placed  in the dorsal lithotomy position, prepped and draped in the usual sterile fashion, and preoperative antibiotics were administered. A preoperative time-out was performed.    A penile block was then performed with local anesthetic. The foreskin was then opened enough to allow me to insert the straight clamp and at the 12:00 position and clamped down foreskin. I then used Metzenbaum scissors and cut down the ischemic tract. I repeated this 3 times for approximately a 3 cm incision dorsally. I then marked the corners with a 3-0 chromic began at the 12:00 position sewing down to the edges with a baseball stitch to reapproximate the foreskin. A laceration was made within the foreskin during the initial opening up of the foreskin which I also reapproximated with a 3-0 chromic. Hemostasis was noted to be quite good. At this point I used large amount of Betadine to clean the foreskin in the glans prior to performing cystoscopy.   I then gently passed the 21 French 30 cystoscope into the patient's urethra and up into the bladder under visual guidance. A 360 cystoscopic evaluation was then performed with the above findings.  I then removed the 21 French cystoscopic sheath and replacement with a 26 French resectoscope sheath was passed and using the visual obturator under direct vision. An exchange the obturator for the resectoscope itself and the loop cautery. I then proceeded to create grooves within the prostate at the 7:00 position extending the defect from the bladder neck at 7:00 down to the prostate apex just lateral to the verumontanum. I took this groove down to the capsule. I then performed a similar maneuver on the patient's left side at the 5:00 position taking the prostate down from the bladder neck to the apex  and down the prostatic capsule. At this point I proceeded to resect the patient's right lateral lobe in a systematic fashion moving from the 7:00 position up to approximately 11. The resection was  taken down to the prostate capsule. Hemostasis was achieved on this side prior to moving to the patient's left lateral lobe. A similar sequence was performed taking the prostate down from the 5:00 position to approximately the 1:00 position to the level of the prostatic capsule. I then completed the resection of the posterior wall as well as the area between 5:00 and 7:00 along the bladder neck. I was careful not to undermine the bladder neck or resect the inferior. I then did resect the area that was protruding into the prostatic urethra anteriorly.   Once I was satisfied that the prostate had been adequately resected I evacuated the prostate chips using a Toomey syringe. I then reintroduced the resectoscope and ensured adequate hemostasis. I then left the bladder full and removed the resectoscope entirely. Exam under anesthesia demonstrated a normal sized prostate with no nodules.  I then passed a 11 [place] three-way Foley catheter into the patient's bladder is R the patient on continuous bladder irrigation. On a relatively slow drip the reflux is noted to be clear.  I then cleaned the glans and the dorsal slit thoroughly with normal saline and then applied bacitracin to the incision sites. I placed a B&O suppository into the patient's rectum.  The patient was subsequent x-ray returned to PACU in stable condition.  Ardis Hughs, MD

## 2015-10-28 NOTE — Anesthesia Preprocedure Evaluation (Signed)
Anesthesia Evaluation  Patient identified by MRN, date of birth, ID band Patient awake    Reviewed: Allergy & Precautions, NPO status , Patient's Chart, lab work & pertinent test results  Airway Mallampati: II  TM Distance: >3 FB Neck ROM: Full    Dental no notable dental hx.    Pulmonary former smoker,    Pulmonary exam normal breath sounds clear to auscultation       Cardiovascular hypertension, Pt. on medications and Pt. on home beta blockers + CAD  Normal cardiovascular exam+ dysrhythmias Atrial Fibrillation and Ventricular Tachycardia + pacemaker  Rhythm:Regular Rate:Normal     Neuro/Psych Dementia Non compliance CVA    GI/Hepatic negative GI ROS, Neg liver ROS,   Endo/Other  diabetes, Type 2  Renal/GU negative Renal ROS  negative genitourinary   Musculoskeletal negative musculoskeletal ROS (+)   Abdominal   Peds negative pediatric ROS (+)  Hematology negative hematology ROS (+)   Anesthesia Other Findings   Reproductive/Obstetrics negative OB ROS                             Anesthesia Physical Anesthesia Plan  ASA: III  Anesthesia Plan: General   Post-op Pain Management:    Induction: Intravenous  Airway Management Planned: Oral ETT  Additional Equipment:   Intra-op Plan:   Post-operative Plan: Extubation in OR and Possible Post-op intubation/ventilation  Informed Consent: I have reviewed the patients History and Physical, chart, labs and discussed the procedure including the risks, benefits and alternatives for the proposed anesthesia with the patient or authorized representative who has indicated his/her understanding and acceptance.   Dental advisory given  Plan Discussed with: CRNA  Anesthesia Plan Comments: (High risk for anesthetic complications. No interventions will further mitigate risks.)        Anesthesia Quick Evaluation

## 2015-10-28 NOTE — Anesthesia Postprocedure Evaluation (Signed)
Anesthesia Post Note  Patient: Jon Davis  Procedure(s) Performed: Procedure(s) (LRB): CIRCUMCISION ADULT, DORSAL SLIT (N/A) TRANSURETHRAL RESECTION OF THE PROSTATE (TURP) (N/A)  Patient location during evaluation: PACU Anesthesia Type: General Level of consciousness: awake and alert Pain management: pain level controlled Vital Signs Assessment: post-procedure vital signs reviewed and stable Respiratory status: spontaneous breathing, nonlabored ventilation, respiratory function stable and patient connected to nasal cannula oxygen Cardiovascular status: blood pressure returned to baseline and stable Postop Assessment: no signs of nausea or vomiting Anesthetic complications: no    Last Vitals:  Vitals:   10/28/15 1245 10/28/15 1307  BP: (!) 157/68 (!) 148/71  Pulse: 64 68  Resp: 12 15  Temp: 36.8 C 37 C    Last Pain:  Vitals:   10/28/15 2006  TempSrc:   PainSc: 7     LLE Motor Response: Purposeful movement;Tremors (10/28/15 2009)   RLE Motor Response: Purposeful movement;Tremors (10/28/15 2009)        [name]

## 2015-10-28 NOTE — Interval H&P Note (Signed)
History and Physical Interval Note: Ok to proceed with dorsal slit and TURP.  R/B discussed with patient and his wife.  10/28/2015 9:27 AM  Jon Davis  has presented today for surgery, with the diagnosis of Urinary Retention  The various methods of treatment have been discussed with the patient and family. After consideration of risks, benefits and other options for treatment, the patient has consented to  Procedure(s): CIRCUMCISION ADULT, DORSAL SLIT (N/A) TRANSURETHRAL RESECTION OF THE PROSTATE (TURP) (N/A) as a surgical intervention .  The patient's history has been reviewed, patient examined, no change in status, stable for surgery.  I have reviewed the patient's chart and labs.  Questions were answered to the patient's satisfaction.     Louis Meckel W

## 2015-10-28 NOTE — Progress Notes (Signed)
PT Cancellation Note  Patient Details Name: Jon Davis MRN: [id number] DOB: 1926/09/26   Cancelled Treatment:    Reason Eval/Treat Not Completed: Patient at procedure or test/unavailable will check back at another time   Marcelino Freestone PT [id number]  10/28/2015, 9:00 AM

## 2015-10-28 NOTE — Progress Notes (Signed)
OT Cancellation Note  Patient Details Name: Jon Davis MRN: [id number] DOB: [birth year]   Cancelled Treatment:     Reason Eval/Treat Not Completed: Patient at procedure or test/unavailable; will check back at another time  Teana Lindahl A 10/28/2015, 10:10 AM

## 2015-10-28 NOTE — Transfer of Care (Signed)
Immediate Anesthesia Transfer of Care Note  Patient: Jon Davis  Procedure(s) Performed: Procedure(s): CIRCUMCISION ADULT, DORSAL SLIT (N/A) TRANSURETHRAL RESECTION OF THE PROSTATE (TURP) (N/A)  Patient Location: PACU  Anesthesia Type:General  Level of Consciousness:  sedated, patient cooperative and responds to stimulation  Airway & Oxygen Therapy:Patient Spontanous Breathing and Patient connected to face mask oxgen  Post-op Assessment:  Report given to PACU RN and Post -op Vital signs reviewed and stable  Post vital signs:  Reviewed and stable  Last Vitals:  Vitals:   10/28/15 0452 10/28/15 0832  BP: 135/62 (!) 153/73  Pulse: (!) 59 60  Resp: 20 (!) 22  Temp: 36.9 C 0000000 C    Complications: No apparent anesthesia complications

## 2015-10-28 NOTE — Progress Notes (Signed)
Patient ID: Jon Davis, male   DOB: 07-27-1926, 82 y.o.   MRN: [id number]    PROGRESS NOTE    JIAYI KOBERSTEIN  [id number] DOB: [birth year] DOA: 10/27/2015  PCP: Haywood Pao, MD    Brief Narrative:  82 y.o. male with medical history significant of dementia, A. fib not on anticoagulants due to dementia, hypertension, hyperlipidemia, diabetes mellitus, GERD, depression, CKD-III, BPH, CAD, s/p of pacemaker placement, who presented with urinary retention and Foley catheter issues.  Assessment & Plan:  Urinary retention : urinary retention secondary to bladder outlet obstruction from an enlarged prostate.   - now status post dorsal slit procedure for his phimosis and TURP - Postop care for his dorsal slit is to apply bacitracin 3 times daily to the area. - per urology, patient will remain on continuous bladder irrigation overnight, likely leave Foley catheter and an extra day and have a voiding trial on Tuesday morning.  DM-II - Last A1c 6.2 on 07/05/07, well controled. Patient is taking glipizide at home -SSI - A1C pending   HLD - Last LDL was 63 on 07/06/07 - Continue home medications: Lipitor  Atrial Fibrillation - CHA2DS2-VASc Score is 6, needs oral anticoagulation. Patient is not on at home possibly due to dementia and high risk of fall. Heart rate is well controlled. - Continue metoprolol  HTN, essential  - continue amlodipine, metoprolol  GERD: - continue Protonix  CAD (coronary artery disease) - Continue metoprolol and Lipitor  Dementia: - Continue Namenda and donepezil  Hypokalemia - with hypomagnesemia - supplement both electrolytes and repeat BMP and Mg in AM   DVT prophylaxis: SCD's Code Status: DNR Family Communication:  Disposition Plan: To be determined   Consultants:   Urology   Procedures:   TURP 9/17  Antimicrobials:   Cipro 9/16 -->   Subjective: No events overnight.   Objective: Vitals:   10/27/15 2121  10/27/15 2122 10/28/15 0452 10/28/15 0832  BP:  (!) 148/63 135/62 (!) 153/73  Pulse: 64  (!) 59 60  Resp: 20  20 (!) 22  Temp: 99.6 F (37.6 C)  98.5 F (36.9 C) 98.9 F (37.2 C)  TempSrc: Oral  Oral Oral  SpO2:   95%   Weight:      Height:        Intake/Output Summary (Last 24 hours) at 10/28/15 1023 Last data filed at 10/28/15 0451  Gross per 24 hour  Intake              670 ml  Output             1651 ml  Net             -981 ml   Filed Weights   10/27/15 1700  Weight: 89.9 kg (198 lb 3.1 oz)    Examination:  General exam: Appears calm and comfortable  Respiratory system: Clear to auscultation. Respiratory effort normal. Cardiovascular system: S1 & S2 heard, RRR. No JVD, murmurs, rubs, gallops or clicks. Gastrointestinal system: Abdomen is nondistended, soft and nontender. No organomegaly or masses felt. Normal bowel sounds heard.   Data Reviewed: I have personally reviewed following labs and imaging studies  CBC:  Recent Labs Lab 10/25/15 1533 10/27/15 1509 10/28/15 0425  WBC 10.2 9.3 9.6  NEUTROABS 6.7  --   --   HGB 14.0 13.6 12.6*  HCT 39.5 39.9 37.4*  MCV 87.2 89.5 90.1  PLT 260 327 [id number]   Basic Metabolic Panel:  Recent Labs Lab  10/25/15 1533 10/27/15 1509 10/28/15 0425  NA 139 140 142  K 3.2* 3.2* 3.1*  CL 105 106 106  CO2 27 25 30   GLUCOSE 128* 170* 143*  BUN 17 13 14   CREATININE 1.39* 1.31* 1.39*  CALCIUM 8.1* 8.3* 8.2*  MG  --   --  1.6*   Coagulation Profile:  Recent Labs Lab 10/27/15 1637  INR 1.07   Cardiac Enzymes:  Recent Labs Lab 10/27/15 2143 10/28/15 0425  TROPONINI <0.03 <0.03   CBG:  Recent Labs Lab 10/21/15 1207 10/27/15 1725 10/27/15 2120 10/28/15 0736  GLUCAP 140* 145* 171* 162*   Urine analysis:    Component Value Date/Time   COLORURINE AMBER (A) 10/25/2015 1459   APPEARANCEUR CLOUDY (A) 10/25/2015 1459   LABSPEC 1.018 10/25/2015 1459   PHURINE 6.0 10/25/2015 1459   GLUCOSEU NEGATIVE 10/25/2015  1459   HGBUR SMALL (A) 10/25/2015 1459   BILIRUBINUR NEGATIVE 10/25/2015 1459   BILIRUBINUR small 08/19/2012 1241   KETONESUR NEGATIVE 10/25/2015 1459   PROTEINUR NEGATIVE 10/25/2015 1459   UROBILINOGEN 0.2 08/19/2012 1241   UROBILINOGEN 0.2 03/20/2009 0645   NITRITE POSITIVE (A) 10/25/2015 1459   LEUKOCYTESUR SMALL (A) 10/25/2015 1459   Recent Results (from the past 240 hour(s))  C difficile quick scan w PCR reflex     Status: None   Collection Time: 10/18/15  5:19 PM  Result Value Ref Range Status   C Diff antigen NEGATIVE NEGATIVE Final   C Diff toxin NEGATIVE NEGATIVE Final   C Diff interpretation No C. difficile detected.  Final  Gastrointestinal Panel by PCR , Stool     Status: None   Collection Time: 10/18/15  5:19 PM  Result Value Ref Range Status   Campylobacter species NOT DETECTED NOT DETECTED Final   Plesimonas shigelloides NOT DETECTED NOT DETECTED Final   Salmonella species NOT DETECTED NOT DETECTED Final   Yersinia enterocolitica NOT DETECTED NOT DETECTED Final   Vibrio species NOT DETECTED NOT DETECTED Final   Vibrio cholerae NOT DETECTED NOT DETECTED Final   Enteroaggregative E coli (EAEC) NOT DETECTED NOT DETECTED Final   Enteropathogenic E coli (EPEC) NOT DETECTED NOT DETECTED Final   Enterotoxigenic E coli (ETEC) NOT DETECTED NOT DETECTED Final   Shiga like toxin producing E coli (STEC) NOT DETECTED NOT DETECTED Final   E. coli O157 NOT DETECTED NOT DETECTED Final   Shigella/Enteroinvasive E coli (EIEC) NOT DETECTED NOT DETECTED Final   Cryptosporidium NOT DETECTED NOT DETECTED Final   Cyclospora cayetanensis NOT DETECTED NOT DETECTED Final   Entamoeba histolytica NOT DETECTED NOT DETECTED Final   Giardia lamblia NOT DETECTED NOT DETECTED Final   Adenovirus F40/41 NOT DETECTED NOT DETECTED Final   Astrovirus NOT DETECTED NOT DETECTED Final   Norovirus GI/GII NOT DETECTED NOT DETECTED Final   Rotavirus A NOT DETECTED NOT DETECTED Final   Sapovirus (I, II,  IV, and V) NOT DETECTED NOT DETECTED Final      Radiology Studies: Dg Chest 2 View  Result Date: 10/27/2015 CLINICAL DATA:  Chest pain. EXAM: CHEST  2 VIEW COMPARISON:  10/17/2015 FINDINGS: Dual lead left-sided pacemaker remains in place. There is stable cardiomegaly. Improved lung aeration from prior. Previous left lung base and right midlung zone opacities have resolved. Minimal streaky residual atelectasis or scarring in the left lung base. Mild vascular congestion persists but is improved. No pleural fluid or pneumothorax. No acute osseous abnormality. IMPRESSION: Improved lung aeration from prior with have resolved left basilar and right midlung zone  opacities. Stable cardiomegaly.  Improved vascular congestion. Electronically Signed   By: Jeb Levering M.D.   On: 10/27/2015 23:11      Scheduled Meds: . [MAR Hold] ALPRAZolam  0.25 mg Oral BID  . [MAR Hold] amLODipine  2.5 mg Oral Daily  . [MAR Hold] atorvastatin  20 mg Oral QPM  . [MAR Hold] Chlorhexidine Gluconate Cloth  6 each Topical Q0600  . [MAR Hold] donepezil  23 mg Oral QHS  . [MAR Hold] ferrous sulfate  325 mg Oral BID WC  . [MAR Hold] Influenza vac split quadrivalent PF  0.5 mL Intramuscular Tomorrow-1000  . [MAR Hold] insulin aspart  0-5 Units Subcutaneous QHS  . [MAR Hold] insulin aspart  0-9 Units Subcutaneous TID WC  . [MAR Hold] isosorbide mononitrate  30 mg Oral Daily  . [MAR Hold] memantine  10 mg Oral BID  . [MAR Hold] metoprolol succinate  25 mg Oral Daily  . [MAR Hold] mupirocin ointment  1 application Nasal BID  . [MAR Hold] pantoprazole  40 mg Oral Daily  . [MAR Hold] pramipexole  0.25 mg Oral QPM  . [MAR Hold] sertraline  50 mg Oral Daily  . [MAR Hold] sodium chloride flush  3 mL Intravenous Q12H  . [MAR Hold] tamsulosin  0.4 mg Oral Daily   Continuous Infusions: . sodium chloride 75 mL/hr at 10/27/15 1955     LOS: 1 day   Time spent: 20 minutes   Faye Ramsay, MD [facility] Pager [phone]  If 7PM-7AM, please contact night-coverage [email or web address] Password [facility] 10/28/2015, 10:23 AM

## 2015-10-29 ENCOUNTER — Encounter (HOSPITAL_COMMUNITY): Payer: Self-pay | Admitting: Urology

## 2015-10-29 LAB — GLUCOSE, CAPILLARY
GLUCOSE-CAPILLARY: 130 mg/dL — AB (ref 65–99)
GLUCOSE-CAPILLARY: 129 mg/dL — AB (ref 65–99)
GLUCOSE-CAPILLARY: 155 mg/dL — AB (ref 65–99)
Glucose-Capillary: 150 mg/dL — ABNORMAL HIGH (ref 65–99)

## 2015-10-29 LAB — CBC
HEMATOCRIT: 40.4 % (ref 39.0–52.0)
Hemoglobin: 13.5 g/dL (ref 13.0–17.0)
MCH: 30.4 pg (ref 26.0–34.0)
MCHC: 33.4 g/dL (ref 30.0–36.0)
MCV: 91 fL (ref 78.0–100.0)
Platelets: 309 10*3/uL (ref 150–400)
RBC: 4.44 MIL/uL (ref 4.22–5.81)
RDW: 13.6 % (ref 11.5–15.5)
WBC: 11.9 10*3/uL — AB (ref 4.0–10.5)

## 2015-10-29 LAB — BASIC METABOLIC PANEL
Anion gap: 7 (ref 5–15)
BUN: 12 mg/dL (ref 6–20)
CALCIUM: 8.2 mg/dL — AB (ref 8.9–10.3)
CO2: 27 mmol/L (ref 22–32)
CREATININE: 1.17 mg/dL (ref 0.61–1.24)
Chloride: 106 mmol/L (ref 101–111)
GFR calc non Af Amer: 54 mL/min — ABNORMAL LOW (ref >60)
GLUCOSE: 137 mg/dL — AB (ref 65–99)
Potassium: 3.9 mmol/L (ref 3.5–5.1)
Sodium: 140 mmol/L (ref 135–145)

## 2015-10-29 MED ORDER — BACITRACIN ZINC 500 UNIT/GM EX OINT
TOPICAL_OINTMENT | Freq: Three times a day (TID) | CUTANEOUS | Status: DC
  Administered 2015-10-29 – 2015-10-31 (×8): 15.7778 via TOPICAL
  Filled 2015-10-29: qty 0.9

## 2015-10-29 MED ORDER — ACETAMINOPHEN 500 MG PO TABS
1000.0000 mg | ORAL_TABLET | Freq: Four times a day (QID) | ORAL | Status: DC | PRN
Start: 2015-10-29 — End: 2015-10-31

## 2015-10-29 MED ORDER — IBUPROFEN 200 MG PO TABS
400.0000 mg | ORAL_TABLET | ORAL | Status: DC | PRN
  Administered 2015-10-29: 400 mg via ORAL
  Filled 2015-10-29: qty 2

## 2015-10-29 MED ORDER — TRAMADOL HCL 50 MG PO TABS: 50.0000 mg | ORAL_TABLET | Freq: Four times a day (QID) | ORAL | Status: DC | PRN

## 2015-10-29 NOTE — Evaluation (Signed)
   Occupational Therapy Evaluation Patient Details Name: Jon Davis MRN: [id number] DOB: Jul 17, 1926 Today's Date: 16-Nov-2015    History of Present Illness 82 yo male admitted with urinar retention. S/P TURP 9/17. Hx of dementia, A fib, HTN, DM, CKD, CAD, pacemaker   Clinical Impression   Pt admitted with urinary retention. Pt currently with functional limitations due to the deficits listed below (see OT Problem List).  Pt will benefit from skilled OT to increase their safety and independence with ADL and functional mobility for ADL to facilitate discharge to venue listed below.      Follow Up Recommendations  SNF;Supervision/Assistance - 24 hour;Home health OT    Equipment Recommendations  None recommended by OT       Precautions / Restrictions Precautions Precautions: Fall Restrictions Weight Bearing Restrictions: No              ADL Overall ADL's : Needs assistance/impaired     Grooming: Minimal assistance;Sitting   Upper Body Bathing: Minimal assitance;Sitting   Lower Body Bathing: Maximal assistance;Sit to/from stand;Cueing for safety;Cueing for sequencing           Toilet Transfer: Minimal assistance;Moderate assistance Toilet Transfer Details (indicate cue type and reason): sit to stand transition only                           Pertinent Vitals/Pain Pain Assessment: No/denies pain        Extremity/Trunk Assessment         Cervical / Trunk Assessment Cervical / Trunk Assessment: Normal   Communication Communication Communication: No difficulties   Cognition Arousal/Alertness: Awake/alert Behavior During Therapy: WFL for tasks assessed/performed Overall Cognitive Status: No family/caregiver present to determine baseline cognitive functioning (pt not sure how many kids he has or how long married)                     General Comments    Pt fatigued and ask to rest.            Home Living Family/patient expects to  be discharged to::  (no family present-pt will likely need rehab)                                        Prior Functioning/Environment Level of Independence: Independent (per pt he was I with ADL activity)                 OT Problem List: Decreased strength;Decreased activity tolerance   OT Treatment/Interventions: Self-care/ADL training;DME and/or AE instruction;Patient/family education;Therapeutic activities    OT Goals(Current goals can be found in the care plan section) Acute Rehab OT Goals Patient Stated Goal: get back home with wife OT Goal Formulation: With patient Time For Goal Achievement: 11/12/15 Potential to Achieve Goals: Good  OT Frequency: Min 2X/week   Barriers to D/C:               End of Session Nurse Communication: Mobility status  Activity Tolerance: Patient limited by fatigue Patient left: in bed;with call bell/phone within reach;with bed alarm set   Time: [id number] OT Time Calculation (min): 16 min Charges:  OT General Charges $OT Visit: 1 Procedure OT Evaluation $OT Eval Moderate Complexity: 1 Procedure G-Codes:    Betsy Pries 2015-11-16, 3:44 PM

## 2015-10-29 NOTE — Evaluation (Signed)
Physical Therapy Evaluation Patient Details Name: SYLAR FOXX MRN: [id number] DOB: 1926/04/17 Today's Date: 10/29/2015   History of Present Illness  82 yo male admitted with urinar retention. S/P TURP 9/17. Hx of dementia, A fib, HTN, DM, CKD, CAD, pacemaker  Clinical Impression  On eval, pt required Min assist for mobility. He walked ~125 feet with a RW. Pt is generally weak and unsteady.No family present during session. Recommend SNF vs HHPT, depending on family's ability to provide current level of care. Will follow and progress activity as tolerated.     Follow Up Recommendations SNF vs Home health PT;Supervision/Assistance - 24 hour (depending on family's ability to provide current level of care)    Equipment Recommendations  Rolling walker with 5" wheels (possibly)    Recommendations for Other Services       Precautions / Restrictions Precautions Precautions: Fall Restrictions Weight Bearing Restrictions: No      Mobility  Bed Mobility Overal bed mobility: Needs Assistance Bed Mobility: Supine to Sit;Sit to Supine     Supine to sit: Min assist Sit to supine: Min guard   General bed mobility comments: Assist for trunk. Increased time.  Transfers Overall transfer level: Needs assistance Equipment used: Rolling walker (2 wheeled) Transfers: Sit to/from [facility] Sit to Stand: Min assist Stand pivot transfers: Min assist       General transfer comment: Assist to rise,stabilize, control descent. Stand pivot, bed to bsc.  Ambulation/Gait Ambulation/Gait assistance: Min assist Ambulation Distance (Feet): 125 Feet Assistive device: Rolling walker (2 wheeled) Gait Pattern/deviations: Step-through pattern     General Gait Details: Assist to stabilize pt and maneuver safely with walker. Unsure if pt used walker at baseline. Pt tolerated distance well.   Stairs            Wheelchair Mobility    Modified Rankin (Stroke Patients  Only)       Balance                                             Pertinent Vitals/Pain Pain Assessment: Faces Faces Pain Scale: Hurts little more Pain Location: groin area Pain Intervention(s): Monitored during session    Home Living                   Additional Comments: unsure of PLOF-no family present during session. Pt did not provide concrete answers to questions.    Prior Function           Comments: unsure of PLOF-no family present during session. Pt did not provide concrete answers to questions.     Hand Dominance        Extremity/Trunk Assessment   Upper Extremity Assessment: Generalized weakness           Lower Extremity Assessment: Generalized weakness      Cervical / Trunk Assessment: Normal  Communication   Communication: No difficulties  Cognition Arousal/Alertness: Awake/alert Behavior During Therapy: WFL for tasks assessed/performed Overall Cognitive Status: History of cognitive impairments - at baseline                      General Comments      Exercises     Assessment/Plan    PT Assessment Patient needs continued PT services  PT Problem List Decreased strength;Decreased mobility;Decreased activity tolerance;Decreased balance;Decreased cognition;Pain;Decreased knowledge of use of DME  PT Treatment Interventions DME instruction;Gait training;Functional mobility training;Therapeutic activities;Therapeutic exercise;Balance training;Patient/family education    PT Goals (Current goals can be found in the Care Plan section)  Acute Rehab PT Goals Patient Stated Goal: none stated PT Goal Formulation: With patient Time For Goal Achievement: 11/12/15 Potential to Achieve Goals: Good    Frequency Min 3X/week   Barriers to discharge        Co-evaluation               End of Session Equipment Utilized During Treatment: Gait belt Activity Tolerance: Patient tolerated treatment  well Patient left: in bed;with call bell/phone within reach;with bed alarm set           Time: [id number] PT Time Calculation (min) (ACUTE ONLY): 15 min   Charges:   PT Evaluation $PT Eval Low Complexity: 1 Procedure     PT G Codes:        Weston Anna, MPT Pager: [phone]

## 2015-10-29 NOTE — Progress Notes (Signed)
Patient ID: Jon Davis, male   DOB: February 17, 1926, 82 y.o.   MRN: [id number]    PROGRESS NOTE    Jon Davis  [id number] DOB: April 23, 1926 DOA: 10/27/2015  PCP: Haywood Pao, MD    Brief Narrative:  82 y.o. male with medical history significant of dementia, A. fib not on anticoagulants due to dementia, hypertension, hyperlipidemia, diabetes mellitus, GERD, depression, CKD-III, BPH, CAD, s/p of pacemaker placement, who presented with urinary retention and Foley catheter issues.  Assessment & Plan:  Urinary retention, acute kidney injury  : urinary retention secondary to bladder outlet obstruction from an enlarged prostate.   - now status post dorsal slit procedure for his phimosis and TURP, post op day #1 - Postop care for his dorsal slit is to apply bacitracin 3 times daily to the area. - per urology, leave catheter in one extra day and then give voiding trial tomorrow - Cr is now WNL  DM-II - Last A1c 6.2 on 07/05/07, well controled. Patient is taking glipizide at home - SSI - A1C pending   HLD - Last LDL was 63 on 07/06/07 - Continue home medications: Lipitor  Atrial Fibrillation, rate controlled  - CHA2DS2-VASc Score is 6, needs oral anticoagulation.  - Patient is not on at home possibly due to dementia and high risk of fall - Continue metoprolol  HTN, essential  - continue amlodipine, metoprolol  GERD: - continue Protonix  CAD (coronary artery disease) - Continue metoprolol and Lipitor  Dementia: - Continue Namenda and donepezil  Hypokalemia - with hypomagnesemia - supplemented   DVT prophylaxis: SCD's Code Status: DNR Family Communication:  Disposition Plan: Home vs SNF  Consultants:   Urology   Procedures:   TURP 9/17  Antimicrobials:   Cipro 9/16 -->   Subjective: No events overnight.   Objective: Vitals:   10/28/15 1307 10/28/15 2200 10/29/15 0437 10/29/15 1430  BP: (!) 148/71 (!) 150/69 (!) 153/61 (!) 150/60    Pulse: 68 65 66 67  Resp: 15 16 18 18   Temp: 98.6 F (37 C) 99.9 F (37.7 C) 98 F (36.7 C) 98.2 F (36.8 C)  TempSrc:  Oral Oral Oral  SpO2: 98% 96% 96% 97%  Weight:      Height:        Intake/Output Summary (Last 24 hours) at 10/29/15 1820 Last data filed at 10/29/15 1300  Gross per 24 hour  Intake             5555 ml  Output             5025 ml  Net              530 ml   Filed Weights   10/27/15 1700  Weight: 89.9 kg (198 lb 3.1 oz)    Examination:  General exam: Appears calm and comfortable  Respiratory system: Clear to auscultation. Respiratory effort normal. Cardiovascular system: S1 & S2 heard, RRR. No JVD, murmurs, rubs, gallops or clicks. Gastrointestinal system: Abdomen is nondistended, soft and nontender. No organomegaly or masses felt. Normal bowel sounds heard.   Data Reviewed: I have personally reviewed following labs and imaging studies  CBC:  Recent Labs Lab 10/25/15 1533 10/27/15 1509 10/28/15 0425 10/28/15 1255 10/29/15 0336  WBC 10.2 9.3 9.6 10.1 11.9*  NEUTROABS 6.7  --   --   --   --   HGB 14.0 13.6 12.6* 13.5 13.5  HCT 39.5 39.9 37.4* 40.5 40.4  MCV 87.2 89.5 90.1 90.6 91.0  PLT 260 327 298 320 [id number]   Basic Metabolic Panel:  Recent Labs Lab 10/25/15 1533 10/27/15 1509 10/28/15 0425 10/28/15 1255 10/29/15 0336  NA 139 140 142 140 140  K 3.2* 3.2* 3.1* 3.5 3.9  CL 105 106 106 106 106  CO2 27 25 30 28 27   GLUCOSE 128* 170* 143* 178* 137*  BUN 17 13 14 13 12   CREATININE 1.39* 1.31* 1.39* 1.32* 1.17  CALCIUM 8.1* 8.3* 8.2* 8.1* 8.2*  MG  --   --  1.6*  --   --    Coagulation Profile:  Recent Labs Lab 10/27/15 1637  INR 1.07   Cardiac Enzymes:  Recent Labs Lab 10/27/15 2143 10/28/15 0425 10/28/15 1255  TROPONINI <0.03 <0.03 <0.03   CBG:  Recent Labs Lab 10/28/15 1631 10/28/15 2157 10/29/15 0750 10/29/15 1124 10/29/15 1648  GLUCAP 143* 138* 130* 129* 155*   Urine analysis:    Component Value Date/Time    COLORURINE AMBER (A) 10/25/2015 1459   APPEARANCEUR CLOUDY (A) 10/25/2015 1459   LABSPEC 1.018 10/25/2015 1459   PHURINE 6.0 10/25/2015 1459   GLUCOSEU NEGATIVE 10/25/2015 1459   HGBUR SMALL (A) 10/25/2015 1459   BILIRUBINUR NEGATIVE 10/25/2015 1459   BILIRUBINUR small 08/19/2012 1241   KETONESUR NEGATIVE 10/25/2015 1459   PROTEINUR NEGATIVE 10/25/2015 1459   UROBILINOGEN 0.2 08/19/2012 1241   UROBILINOGEN 0.2 03/20/2009 0645   NITRITE POSITIVE (A) 10/25/2015 1459   LEUKOCYTESUR SMALL (A) 10/25/2015 1459   Radiology Studies: Dg Chest 2 View  Result Date: 10/27/2015 CLINICAL DATA:  Chest pain. EXAM: CHEST  2 VIEW COMPARISON:  10/17/2015 FINDINGS: Dual lead left-sided pacemaker remains in place. There is stable cardiomegaly. Improved lung aeration from prior. Previous left lung base and right midlung zone opacities have resolved. Minimal streaky residual atelectasis or scarring in the left lung base. Mild vascular congestion persists but is improved. No pleural fluid or pneumothorax. No acute osseous abnormality. IMPRESSION: Improved lung aeration from prior with have resolved left basilar and right midlung zone opacities. Stable cardiomegaly.  Improved vascular congestion. Electronically Signed   By: Jeb Levering M.D.   On: 10/27/2015 23:11      Scheduled Meds: . ALPRAZolam  0.25 mg Oral BID  . amLODipine  2.5 mg Oral Daily  . atorvastatin  20 mg Oral QPM  . bacitracin   Topical TID  . Chlorhexidine Gluconate Cloth  6 each Topical Q0600  . donepezil  23 mg Oral QHS  . ferrous sulfate  325 mg Oral BID WC  . Influenza vac split quadrivalent PF  0.5 mL Intramuscular Tomorrow-1000  . insulin aspart  0-5 Units Subcutaneous QHS  . insulin aspart  0-9 Units Subcutaneous TID WC  . isosorbide mononitrate  30 mg Oral Daily  . memantine  10 mg Oral BID  . metoprolol succinate  25 mg Oral Daily  . mupirocin ointment  1 application Nasal BID  . pantoprazole  40 mg Oral Daily  .  pramipexole  0.25 mg Oral QPM  . sertraline  50 mg Oral Daily  . sodium chloride flush  3 mL Intravenous Q12H   Continuous Infusions: . sodium chloride irrigation       LOS: 2 days   Time spent: 20 minutes   Jon Ramsay, MD [facility] Pager [phone]  If 7PM-7AM, please contact night-coverage [email or web address] Password [email or web address] 10/29/2015, 6:20 PM

## 2015-10-29 NOTE — Progress Notes (Signed)
Urology Inpatient Progress Report  Urinary retention [R33.9]  Procedure(s): CIRCUMCISION ADULT, DORSAL SLIT TRANSURETHRAL RESECTION OF THE PROSTATE (TURP)  1 Day Post-Op   Intv/Subj: No acute events overnight. Patient is without complaint.  Principal Problem:   Urinary retention Active Problems:   Type 2 diabetes mellitus with vascular disease (HCC)   Hyperlipidemia   Essential hypertension   Atrial fibrillation (HCC)   CAD (coronary artery disease)   Dementia   Hypokalemia   Refusal of blood transfusions as patient is Jehovah's Witness  Current Facility-Administered Medications  Medication Dose Route Frequency Provider Last Rate Last Dose  . acetaminophen (TYLENOL) tablet 1,000 mg  1,000 mg Oral Q6H PRN Ardis Hughs, MD      . ALPRAZolam Duanne Moron) tablet 0.25 mg  0.25 mg Oral BID Ivor Costa, MD   0.25 mg at 10/28/15 2213  . amLODipine (NORVASC) tablet 2.5 mg  2.5 mg Oral Daily Ivor Costa, MD   2.5 mg at 10/27/15 1957  . atorvastatin (LIPITOR) tablet 20 mg  20 mg Oral QPM Ivor Costa, MD   20 mg at 10/28/15 1913  . bacitracin ointment   Topical TID Ardis Hughs, MD      . Chlorhexidine Gluconate Cloth 2 % PADS 6 each  6 each Topical Q0600 Ardis Hughs, MD   6 each at 10/27/15 2136  . donepezil (ARICEPT) tablet 23 mg  23 mg Oral QHS Ivor Costa, MD   23 mg at 10/28/15 2213  . ferrous sulfate tablet 325 mg  325 mg Oral BID WC Ivor Costa, MD   325 mg at 10/28/15 1913  . ibuprofen (ADVIL,MOTRIN) tablet 400 mg  400 mg Oral Q4H PRN Ardis Hughs, MD      . Influenza vac split quadrivalent PF (FLUARIX) injection 0.5 mL  0.5 mL Intramuscular Tomorrow-1000 Ardis Hughs, MD      . insulin aspart (novoLOG) injection 0-5 Units  0-5 Units Subcutaneous QHS Ivor Costa, MD      . insulin aspart (novoLOG) injection 0-9 Units  0-9 Units Subcutaneous TID WC Ivor Costa, MD   1 Units at 10/28/15 1714  . isosorbide mononitrate (IMDUR) 24 hr tablet 30 mg  30 mg Oral Daily Ivor Costa, MD   30 mg at 10/27/15 1957  . memantine (NAMENDA) tablet 10 mg  10 mg Oral BID Ivor Costa, MD   10 mg at 10/28/15 2213  . metoprolol succinate (TOPROL-XL) 24 hr tablet 25 mg  25 mg Oral Daily Ivor Costa, MD   25 mg at 10/27/15 1956  . mupirocin ointment (BACTROBAN) 2 % 1 application  1 application Nasal BID Ardis Hughs, MD   1 application at [id number]  . ondansetron (ZOFRAN-ODT) disintegrating tablet 4 mg  4 mg Oral Q8H PRN Ivor Costa, MD      . pantoprazole (PROTONIX) EC tablet 40 mg  40 mg Oral Daily Ivor Costa, MD   40 mg at 10/27/15 1958  . pramipexole (MIRAPEX) tablet 0.25 mg  0.25 mg Oral QPM Ivor Costa, MD   0.25 mg at 10/28/15 2217  . sertraline (ZOLOFT) tablet 50 mg  50 mg Oral Daily Ivor Costa, MD      . sodium chloride flush (NS) 0.9 % injection 3 mL  3 mL Intravenous Q12H Ivor Costa, MD   3 mL at 10/28/15 2217  . sodium chloride irrigation 0.9 % 3,000 mL  3,000 mL Irrigation Continuous Ardis Hughs, MD   3,000 mL at 10/28/15 2012  .  traMADol (ULTRAM) tablet 50 mg  50 mg Oral Q6H PRN Ardis Hughs, MD         Objective: Vital: Vitals:   10/28/15 1245 10/28/15 1307 10/28/15 2200 10/29/15 0437  BP: (!) 157/68 (!) 148/71 (!) 150/69 (!) 153/61  Pulse: 64 68 65 66  Resp: 12 15 16 18   Temp: 98.3 F (36.8 C) 98.6 F (37 C) 99.9 F (37.7 C) 98 F (36.7 C)  TempSrc:   Oral Oral  SpO2: 99% 98% 96% 96%  Weight:      Height:       I/Os: I/O last 3 completed shifts: In: 8578.8 [P.O.:240; I.V.:2638.8; MR:[id number]; IV Piggyback:50] Out: EZ:932298; Blood:200]  Physical Exam:  General: Patient is in no apparent distress Lungs: Normal respiratory effort, chest expands symmetrically. Penile incisions are c/d/i, mild edema Foley draining clear urine with CBI off  Lab Results:  Recent Labs  10/28/15 0425 10/28/15 1255 10/29/15 0336  WBC 9.6 10.1 11.9*  HGB 12.6* 13.5 13.5  HCT 37.4* 40.5 40.4    Recent Labs  10/28/15 0425 10/28/15 1255  10/29/15 0336  NA 142 140 140  K 3.1* 3.5 3.9  CL 106 106 106  CO2 30 28 27   GLUCOSE 143* 178* 137*  BUN 14 13 12   CREATININE 1.39* 1.32* 1.17  CALCIUM 8.2* 8.1* 8.2*    Recent Labs  10/27/15 1637  INR 1.07   No results for input(s): LABURIN in the last 72 hours. Results for orders placed or performed during the hospital encounter of 10/17/15  Urine culture     Status: Abnormal   Collection Time: 10/17/15  6:19 PM  Result Value Ref Range Status   Specimen Description URINE, RANDOM  Final   Special Requests NONE  Final   Culture >=100,000 COLONIES/mL ESCHERICHIA COLI (A)  Final   Report Status 10/20/2015 FINAL  Final   Organism ID, Bacteria ESCHERICHIA COLI (A)  Final      Susceptibility   Escherichia coli - MIC*    AMPICILLIN 4 SENSITIVE Sensitive     CEFAZOLIN <=4 SENSITIVE Sensitive     CEFTRIAXONE <=1 SENSITIVE Sensitive     CIPROFLOXACIN <=0.25 SENSITIVE Sensitive     GENTAMICIN <=1 SENSITIVE Sensitive     IMIPENEM <=0.25 SENSITIVE Sensitive     NITROFURANTOIN <=16 SENSITIVE Sensitive     TRIMETH/SULFA <=20 SENSITIVE Sensitive     AMPICILLIN/SULBACTAM <=2 SENSITIVE Sensitive     PIP/TAZO <=4 SENSITIVE Sensitive     Extended ESBL NEGATIVE Sensitive     * >=100,000 COLONIES/mL ESCHERICHIA COLI  MRSA PCR Screening     Status: Abnormal   Collection Time: 10/17/15 11:15 PM  Result Value Ref Range Status   MRSA by PCR POSITIVE (A) NEGATIVE Final    Comment:        The GeneXpert MRSA Assay (FDA approved for NASAL specimens only), is one component of a comprehensive MRSA colonization surveillance program. It is not intended to diagnose MRSA infection nor to guide or monitor treatment for MRSA infections. RESULT CALLED TO, READ BACK BY AND VERIFIED WITH: R.CORCORAN,RN 0238 10/18/15 [facility]   C difficile quick scan w PCR reflex     Status: None   Collection Time: 10/18/15  5:19 PM  Result Value Ref Range Status   C Diff antigen NEGATIVE NEGATIVE Final   C Diff  toxin NEGATIVE NEGATIVE Final   C Diff interpretation No C. difficile detected.  Final  Gastrointestinal Panel by PCR , Stool  Status: None   Collection Time: 10/18/15  5:19 PM  Result Value Ref Range Status   Campylobacter species NOT DETECTED NOT DETECTED Final   Plesimonas shigelloides NOT DETECTED NOT DETECTED Final   Salmonella species NOT DETECTED NOT DETECTED Final   Yersinia enterocolitica NOT DETECTED NOT DETECTED Final   Vibrio species NOT DETECTED NOT DETECTED Final   Vibrio cholerae NOT DETECTED NOT DETECTED Final   Enteroaggregative E coli (EAEC) NOT DETECTED NOT DETECTED Final   Enteropathogenic E coli (EPEC) NOT DETECTED NOT DETECTED Final   Enterotoxigenic E coli (ETEC) NOT DETECTED NOT DETECTED Final   Shiga like toxin producing E coli (STEC) NOT DETECTED NOT DETECTED Final   E. coli O157 NOT DETECTED NOT DETECTED Final   Shigella/Enteroinvasive E coli (EIEC) NOT DETECTED NOT DETECTED Final   Cryptosporidium NOT DETECTED NOT DETECTED Final   Cyclospora cayetanensis NOT DETECTED NOT DETECTED Final   Entamoeba histolytica NOT DETECTED NOT DETECTED Final   Giardia lamblia NOT DETECTED NOT DETECTED Final   Adenovirus F40/41 NOT DETECTED NOT DETECTED Final   Astrovirus NOT DETECTED NOT DETECTED Final   Norovirus GI/GII NOT DETECTED NOT DETECTED Final   Rotavirus A NOT DETECTED NOT DETECTED Final   Sapovirus (I, II, IV, and V) NOT DETECTED NOT DETECTED Final    Studies/Results: Dg Chest 2 View  Result Date: 10/27/2015 CLINICAL DATA:  Chest pain. EXAM: CHEST  2 VIEW COMPARISON:  10/17/2015 FINDINGS: Dual lead left-sided pacemaker remains in place. There is stable cardiomegaly. Improved lung aeration from prior. Previous left lung base and right midlung zone opacities have resolved. Minimal streaky residual atelectasis or scarring in the left lung base. Mild vascular congestion persists but is improved. No pleural fluid or pneumothorax. No acute osseous abnormality.  IMPRESSION: Improved lung aeration from prior with have resolved left basilar and right midlung zone opacities. Stable cardiomegaly.  Improved vascular congestion. Electronically Signed   By: Jeb Levering M.D.   On: 10/27/2015 23:11    Assessment: Procedure(s): CIRCUMCISION ADULT, DORSAL SLIT TRANSURETHRAL RESECTION OF THE PROSTATE (TURP), 1 Day Post-Op  doing well.  Plan: Will leave catheter in one extra day and then give voiding trial tomorrow. Assess for skilled nursing vs. Home health Bacitracin to incisions TID   Louis Meckel, MD Urology 10/29/2015, 8:05 AM

## 2015-10-30 ENCOUNTER — Telehealth: Payer: Self-pay | Admitting: Cardiovascular Disease

## 2015-10-30 ENCOUNTER — Telehealth: Payer: Self-pay

## 2015-10-30 ENCOUNTER — Ambulatory Visit: Payer: Medicare Other | Admitting: Neurology

## 2015-10-30 LAB — CBC
HEMATOCRIT: 36.3 % — AB (ref 39.0–52.0)
HEMOGLOBIN: 12.3 g/dL — AB (ref 13.0–17.0)
MCH: 30.1 pg (ref 26.0–34.0)
MCHC: 33.9 g/dL (ref 30.0–36.0)
MCV: 88.8 fL (ref 78.0–100.0)
Platelets: 279 10*3/uL (ref 150–400)
RBC: 4.09 MIL/uL — ABNORMAL LOW (ref 4.22–5.81)
RDW: 13.3 % (ref 11.5–15.5)
WBC: 10.7 10*3/uL — AB (ref 4.0–10.5)

## 2015-10-30 LAB — GLUCOSE, CAPILLARY
GLUCOSE-CAPILLARY: 131 mg/dL — AB (ref 65–99)
GLUCOSE-CAPILLARY: 249 mg/dL — AB (ref 65–99)
GLUCOSE-CAPILLARY: 90 mg/dL (ref 65–99)
GLUCOSE-CAPILLARY: 139 mg/dL — AB (ref 65–99)

## 2015-10-30 LAB — BASIC METABOLIC PANEL
ANION GAP: 5 (ref 5–15)
BUN: 13 mg/dL (ref 6–20)
CALCIUM: 8.3 mg/dL — AB (ref 8.9–10.3)
CHLORIDE: 106 mmol/L (ref 101–111)
CO2: 29 mmol/L (ref 22–32)
Creatinine, Ser: 1.16 mg/dL (ref 0.61–1.24)
GFR calc Af Amer: >60 mL/min (ref >60)
GFR calc non Af Amer: 55 mL/min — ABNORMAL LOW (ref >60)
GLUCOSE: 137 mg/dL — AB (ref 65–99)
POTASSIUM: 3.6 mmol/L (ref 3.5–5.1)
Sodium: 140 mmol/L (ref 135–145)

## 2015-10-30 LAB — MAGNESIUM: Magnesium: 1.7 mg/dL (ref 1.7–2.4)

## 2015-10-30 NOTE — Clinical Social Work Note (Signed)
Clinical Social Work Assessment  Patient Details  Name: Jon Davis MRN: [id number] Date of Birth: 07/14/1926  Date of referral:  10/30/15               Reason for consult:  Facility Placement, Discharge Planning                Permission sought to share information with:  Family Supports, Customer service manager, Case Optician, dispensing granted to share information::  Yes, Verbal Permission Granted  Name::      Cytogeneticist )  Agency::   (N/A )  Relationship::   (Spouse )  Contact Information:   [phone])  Housing/Transportation Living arrangements for the past 2 months:  Single Family Home Source of Information:  Spouse Patient Interpreter Needed:  None Criminal Activity/Legal Involvement Pertinent to Current Situation/Hospitalization:  No - Comment as needed Significant Relationships:  Spouse, Other Family Members Lives with:  Spouse Do you feel safe going back to the place where you live?  Yes Need for family participation in patient care:  Yes (Comment)  Care giving concerns: Patient admitted from home with wife. Wife denies care giving concerns at this time.    Social Worker assessment / plan:  MSW spoke with patient's wife, Jon Davis in reference to post-acute placement for SNF. MSW introduced MSW role and SNF process. Pt's wife reported that currently patient is service [facility] with [facility] and plans to resume care once patient returns home. Wife is not interested in SNF placement at this time and stated she believes patient will be more comfortable and progress faster if he is at home. Wife stated that patient is NEVER left alone and has someone with him 24 hours per day. Pt's wife also reported that a close friend of the family is with the couple often and able to assist with care as needed. No further concerns reported at this time. MSW will sign off for now as social work intervention is no longer needed. Please consult [place] again if new  need arises.  MSW to notify RNCM.   Employment status:  Retired Forensic scientist:  [employer] PT Recommendations:  [place], Home with [place] / Referral to community resources:   (none )  Patient/Family's Response to care:  Patient disoriented x2. [name] declined SNF placement and reported patient will return home with 24/7 care. Wife supportive and involved in care. Pt's wife appreciated social work intervention.   Patient/Family's Understanding of and Emotional Response to Diagnosis, Current Treatment, and Prognosis:  Wife knowledgeable of medical interventions.   Emotional Assessment Appearance:  Appears stated age Attitude/Demeanor/Rapport:  Unable to Assess Affect (typically observed):  Unable to Assess Orientation:  Oriented to Self, Oriented to Place Alcohol / Substance use:  Not Applicable Psych involvement (Current and /or in the community):  No (Comment)  Discharge Needs  Concerns to be addressed:  Denies Needs/Concerns at this time Readmission within the last 30 days:  No Current discharge risk:  None Barriers to Discharge:  Continued Medical Work up   [employer], MSW [phone] 10/30/2015 9:55 AM

## 2015-10-30 NOTE — Telephone Encounter (Signed)
Received records from [facility] for appointment on 11/13/15 with Dr Sallyanne Kuster.  Records given to [facility] (medical records) for Dr Croitoru's schedule on 11/13/15. lp

## 2015-10-30 NOTE — Progress Notes (Signed)
Patient ID: Jon Davis, male   DOB: 06/16/1926, 82 y.o.   MRN: [id number]    PROGRESS NOTE    Jon Davis  [id number] DOB: Feb 15, 1926 DOA: 10/27/2015  PCP: Haywood Pao, MD    Brief Narrative:  82 y.o. male with medical history significant of dementia, A. fib not on anticoagulants due to dementia, hypertension, hyperlipidemia, diabetes mellitus, GERD, depression, CKD-III, BPH, CAD, s/p of pacemaker placement, who presented with urinary retention and Foley catheter issues.  Assessment & Plan:  Urinary retention, acute kidney injury  : urinary retention secondary to bladder outlet obstruction from an enlarged prostate.   - now status post dorsal slit procedure for his phimosis and TURP, post op day #2 - Postop care for his dorsal slit is to apply bacitracin 3 times daily to the area. - per urology, plan for voiding trial today  - Cr is now WNL  DM-II - Last A1c 6.2 on 07/05/07, well controled. Patient is taking glipizide at home - SSI while inpatient, can resume Glipizide on discharge  - A1C 77.1  HLD - Last LDL was 63 on 07/06/07 - Continue home medications: Lipitor  Atrial Fibrillation, rate controlled  - CHA2DS2-VASc Score is 6, needs oral anticoagulation.  - Patient is not on at home possibly due to dementia and high risk of fall - Continue metoprolol  HTN, essential  - continue amlodipine, metoprolol  GERD: - continue Protonix  CAD (coronary artery disease) - Continue metoprolol and Lipitor  Dementia: - Continue Namenda and donepezil - stable and at baseline   Hypokalemia - with hypomagnesemia - supplemented and WNL this AM   DVT prophylaxis: SCD's Code Status: DNR Family Communication:  Disposition Plan: Home in AM if urology team clears, family declined offer for SNF  Consultants:   Urology   Procedures:   TURP 9/17  Antimicrobials:   Cipro 9/16 --> 9/18   Subjective: No events overnight.   Objective: Vitals:   10/29/15 1430 10/29/15 2139 10/30/15 0608 10/30/15 1448  BP: (!) 150/60 (!) 147/62 (!) 119/49 133/62  Pulse: 67 61 65 63  Resp: 18 16 16 19   Temp: 98.2 F (36.8 C) 99.1 F (37.3 C) 98.1 F (36.7 C) 98.1 F (36.7 C)  TempSrc: Oral Oral Oral Oral  SpO2: 97% 98% 96% 99%  Weight:      Height:        Intake/Output Summary (Last 24 hours) at 10/30/15 1634 Last data filed at 10/30/15 1448  Gross per 24 hour  Intake              600 ml  Output             1250 ml  Net             -650 ml   Filed Weights   10/27/15 1700  Weight: 89.9 kg (198 lb 3.1 oz)    Examination:  General exam: Appears calm and comfortable  Respiratory system: Clear to auscultation. Respiratory effort normal. Cardiovascular system: S1 & S2 heard, RRR. No JVD, murmurs, rubs, gallops or clicks. Gastrointestinal system: Abdomen is nondistended, soft and nontender. No organomegaly or masses felt. Normal bowel sounds heard.   Data Reviewed: I have personally reviewed following labs and imaging studies  CBC:  Recent Labs Lab 10/25/15 1533 10/27/15 1509 10/28/15 0425 10/28/15 1255 10/29/15 0336 10/30/15 0400  WBC 10.2 9.3 9.6 10.1 11.9* 10.7*  NEUTROABS 6.7  --   --   --   --   --  HGB 14.0 13.6 12.6* 13.5 13.5 12.3*  HCT 39.5 39.9 37.4* 40.5 40.4 36.3*  MCV 87.2 89.5 90.1 90.6 91.0 88.8  PLT 260 327 298 320 309 [id number]   Basic Metabolic Panel:  Recent Labs Lab 10/27/15 1509 10/28/15 0425 10/28/15 1255 10/29/15 0336 10/30/15 0400  NA 140 142 140 140 140  K 3.2* 3.1* 3.5 3.9 3.6  CL 106 106 106 106 106  CO2 25 30 28 27 29   GLUCOSE 170* 143* 178* 137* 137*  BUN 13 14 13 12 13   CREATININE 1.31* 1.39* 1.32* 1.17 1.16  CALCIUM 8.3* 8.2* 8.1* 8.2* 8.3*  MG  --  1.6*  --   --  1.7   Coagulation Profile:  Recent Labs Lab 10/27/15 1637  INR 1.07   Cardiac Enzymes:  Recent Labs Lab 10/27/15 2143 10/28/15 0425 10/28/15 1255  TROPONINI <0.03 <0.03 <0.03   CBG:  Recent Labs Lab  10/29/15 1124 10/29/15 1648 10/29/15 2136 10/30/15 0739 10/30/15 1139  GLUCAP 129* 155* 150* 131* 249*   Urine analysis:    Component Value Date/Time   COLORURINE AMBER (A) 10/25/2015 1459   APPEARANCEUR CLOUDY (A) 10/25/2015 1459   LABSPEC 1.018 10/25/2015 1459   PHURINE 6.0 10/25/2015 1459   GLUCOSEU NEGATIVE 10/25/2015 1459   HGBUR SMALL (A) 10/25/2015 1459   BILIRUBINUR NEGATIVE 10/25/2015 1459   BILIRUBINUR small 08/19/2012 1241   KETONESUR NEGATIVE 10/25/2015 1459   PROTEINUR NEGATIVE 10/25/2015 1459   UROBILINOGEN 0.2 08/19/2012 1241   UROBILINOGEN 0.2 03/20/2009 0645   NITRITE POSITIVE (A) 10/25/2015 1459   LEUKOCYTESUR SMALL (A) 10/25/2015 1459   Radiology Studies: No results found.    Scheduled Meds: . ALPRAZolam  0.25 mg Oral BID  . amLODipine  2.5 mg Oral Daily  . atorvastatin  20 mg Oral QPM  . bacitracin   Topical TID  . Chlorhexidine Gluconate Cloth  6 each Topical Q0600  . donepezil  23 mg Oral QHS  . ferrous sulfate  325 mg Oral BID WC  . Influenza vac split quadrivalent PF  0.5 mL Intramuscular Tomorrow-1000  . insulin aspart  0-5 Units Subcutaneous QHS  . insulin aspart  0-9 Units Subcutaneous TID WC  . isosorbide mononitrate  30 mg Oral Daily  . memantine  10 mg Oral BID  . metoprolol succinate  25 mg Oral Daily  . mupirocin ointment  1 application Nasal BID  . pantoprazole  40 mg Oral Daily  . pramipexole  0.25 mg Oral QPM  . sertraline  50 mg Oral Daily  . sodium chloride flush  3 mL Intravenous Q12H   Continuous Infusions: . sodium chloride irrigation       LOS: 3 days   Time spent: 20 minutes   Faye Ramsay, MD [facility] Pager [phone]  If 7PM-7AM, please contact night-coverage [email or web address] Password [facility] 10/30/2015, 4:34 PM

## 2015-10-30 NOTE — Progress Notes (Signed)
Physical Therapy Treatment Patient Details Name: Jon Davis MRN: [id number] DOB: 10/10/1926 Today's Date: 10/30/2015    History of Present Illness 82 yo male admitted with urinar retention. S/P TURP 9/17. Hx of dementia, A fib, HTN, DM, CKD, CAD, pacemaker    PT Comments    Pt was agreeable to ambulating a short distance on today. He complained of feeling fatigued. No family present during session.   Follow Up Recommendations  Home health PT;Supervision/Assistance - 24 hour (family declined SNF)     Equipment Recommendations       Recommendations for Other Services       Precautions / Restrictions Precautions Precautions: Fall Restrictions Weight Bearing Restrictions: No    Mobility  Bed Mobility Overal bed mobility: Needs Assistance Bed Mobility: Supine to Sit     Supine to sit: Min assist     General bed mobility comments: Assist for trunk. Increased time.  Transfers Overall transfer level: Needs assistance Equipment used: Rolling walker (2 wheeled) Transfers: Sit to/from Stand Sit to Stand: Min assist         General transfer comment: Assist to rise,stabilize, control descent.   Ambulation/Gait Ambulation/Gait assistance: Min assist Ambulation Distance (Feet): 100 Feet (15'x1, 100'x1) Assistive device: Rolling walker (2 wheeled) Gait Pattern/deviations: Step-through pattern;Trunk flexed     General Gait Details: Assist to stabilize pt and maneuver safely with walker. Unsure if pt used walker at baseline. Pt tolerated distance well.    Stairs            Wheelchair Mobility    Modified Rankin (Stroke Patients Only)       Balance                                    Cognition Arousal/Alertness: Awake/alert Behavior During Therapy: WFL for tasks assessed/performed Overall Cognitive Status: History of cognitive impairments - at baseline                      Exercises      General Comments         Pertinent Vitals/Pain Pain Assessment: No/denies pain    Home Living                      Prior Function            PT Goals (current goals can now be found in the care plan section) Progress towards PT goals: Progressing toward goals    Frequency    Min 3X/week      PT Plan Current plan remains appropriate    Co-evaluation             End of Session Equipment Utilized During Treatment: Gait belt Activity Tolerance: Patient tolerated treatment well Patient left: in chair;with call bell/phone within reach;with chair alarm set     Time: [id number] PT Time Calculation (min) (ACUTE ONLY): 16 min  Charges:  $Gait Training: 8-22 mins                    G Codes:      Weston Anna, MPT Pager: [phone]

## 2015-10-30 NOTE — Telephone Encounter (Signed)
Pt did not show for their appt with Dr. Dohmeier today.  

## 2015-10-30 NOTE — Progress Notes (Signed)
Pt is active with [facility] with HHRN will need resumption of care orders at discharge and call [facility], RN with [facility] [phone].

## 2015-10-31 ENCOUNTER — Encounter: Payer: Self-pay | Admitting: Neurology

## 2015-10-31 LAB — CBC
HEMATOCRIT: 40.2 % (ref 39.0–52.0)
Hemoglobin: 13.9 g/dL (ref 13.0–17.0)
MCH: 31.5 pg (ref 26.0–34.0)
MCHC: 34.6 g/dL (ref 30.0–36.0)
MCV: 91.2 fL (ref 78.0–100.0)
PLATELETS: 279 10*3/uL (ref 150–400)
RBC: 4.41 MIL/uL (ref 4.22–5.81)
RDW: 13.6 % (ref 11.5–15.5)
WBC: 12.3 10*3/uL — ABNORMAL HIGH (ref 4.0–10.5)

## 2015-10-31 LAB — BASIC METABOLIC PANEL
ANION GAP: 9 (ref 5–15)
BUN: 13 mg/dL (ref 6–20)
CALCIUM: 8.3 mg/dL — AB (ref 8.9–10.3)
CO2: 27 mmol/L (ref 22–32)
Chloride: 102 mmol/L (ref 101–111)
Creatinine, Ser: 1.18 mg/dL (ref 0.61–1.24)
GFR calc Af Amer: >60 mL/min (ref >60)
GFR, EST NON AFRICAN AMERICAN: 54 mL/min — AB (ref >60)
GLUCOSE: 121 mg/dL — AB (ref 65–99)
POTASSIUM: 3.6 mmol/L (ref 3.5–5.1)
Sodium: 138 mmol/L (ref 135–145)

## 2015-10-31 LAB — GLUCOSE, CAPILLARY
GLUCOSE-CAPILLARY: 143 mg/dL — AB (ref 65–99)
Glucose-Capillary: 150 mg/dL — ABNORMAL HIGH (ref 65–99)
Glucose-Capillary: 114 mg/dL — ABNORMAL HIGH (ref 65–99)

## 2015-10-31 MED ORDER — BACITRACIN ZINC 500 UNIT/GM EX OINT: TOPICAL_OINTMENT | Freq: Three times a day (TID) | CUTANEOUS | 0 refills | Status: DC

## 2015-10-31 NOTE — Progress Notes (Signed)
Occupational Therapy Treatment Patient Details Name: Jon Davis MRN: [id number] DOB: 08/05/1926 Today's Date: 10/31/2015    History of present illness 82 yo male admitted with urinar retention. S/P TURP 9/17. Hx of dementia, A fib, HTN, DM, CKD, CAD, pacemaker      Follow Up Recommendations  Home health OT    Equipment Recommendations  None recommended by OT       Precautions / Restrictions Precautions Precautions: Fall Restrictions Weight Bearing Restrictions: No       Mobility Bed Mobility Overal bed mobility: Needs Assistance Bed Mobility: Supine to Sit     Supine to sit: Mod assist     General bed mobility comments: Assist for trunk. Increased time.  Transfers Overall transfer level: Needs assistance Equipment used: Rolling walker (2 wheeled)   Sit to Stand: Min assist         General transfer comment: Assist to rise,stabilize, control descent.         ADL Overall ADL's : Needs assistance/impaired     Grooming: Set up;Sitting                   Toilet Transfer: Moderate assistance;BSC;Cueing for safety;Cueing for sequencing   Toileting- Clothing Manipulation and Hygiene: Moderate assistance;Sit to/from stand;Cueing for compensatory techniques;Cueing for sequencing                          Cognition   Behavior During Therapy: [facility] for tasks assessed/performed Overall Cognitive Status: History of cognitive impairments - at baseline                                    Pertinent Vitals/ Pain       Pain Assessment: No/denies pain            Progress Toward Goals  OT Goals(current goals can now be found in the care plan section)  Progress towards OT goals: Progressing toward goals     Plan Discharge plan remains appropriate       End of Session Equipment Utilized During Treatment: Gait belt   Activity Tolerance Patient tolerated treatment well   Patient Left in chair   Nurse Communication Mobility  status        Time: [id number] OT Time Calculation (min): 11 min  Charges: OT General Charges $OT Visit: 1 Procedure OT Treatments $Self Care/Home Management : 8-22 mins  Jon Davis D 10/31/2015, 10:19 AM

## 2015-10-31 NOTE — Care Management Important Message (Addendum)
Important Message  Patient Details IM Letter given to Cookie/Case Manager to present to Patient Name: Jon Davis MRN: [id number] Date of Birth: 26-Aug-1926   Medicare Important Message Given:  Yes    [name] 10/31/2015, 9:21 AMImportant Message  Patient Details  Name: Jon Davis MRN: [id number] Date of Birth: 1926-05-06   Medicare Important Message Given:  Yes    [name] 10/31/2015, 9:21 AM

## 2015-10-31 NOTE — Progress Notes (Signed)
Spoke with pt's wife RE: discharge and instructions, verbalized understanding, friend of family to pick up, unable to obtain signature on d/c forms as pt unable to sign

## 2015-10-31 NOTE — Discharge Summary (Addendum)
Physician Discharge Summary  Jon Davis [id number] DOB: Nov 15, 1926 DOA: 10/27/2015  PCP: Haywood Pao, MD  Admit date: 10/27/2015 Discharge date: 10/31/2015  Recommendations for Outpatient Follow-up:  1. Pt will need to follow up with PCP in 1-2 weeks post discharge 2. Please obtain BMP to evaluate electrolytes and kidney function 3. Please also check CBC to evaluate Hg and Hct levels 4. Please note that foley cath was removed prior to discharge  Discharge Diagnoses:  Principal Problem:   Urinary retention Active Problems:   Type 2 diabetes mellitus with vascular disease ([place])   Hyperlipidemia   Essential hypertension   Atrial fibrillation ([place])  Discharge Condition: Stable  Diet recommendation: Heart healthy diet discussed in details   Brief Narrative:  82 y.o.malewith medical history significant of dementia, A. fib not on anticoagulants due to dementia, hypertension, hyperlipidemia, diabetes mellitus, GERD, depression, CKD-III, BPH, CAD, s/p of pacemaker placement, who presented with urinary retention and Foley catheter issues.  Assessment & Plan:  Urinary retention, acute kidney injury  : urinary retention secondary to bladder outlet obstruction - please note that pathology notable for adenocarcinoma of the prostate so this is likely the main cause of the outlet obstruction  - now status post dorsal slit procedure for his phimosis and TURP, post op day #3 - Postop care for his dorsal slit is to apply bacitracin 3 times daily to the area. - per urology, trial of voiding today, pt did well, will keep foley out and plan for outpatient follow up with Dr. Louis Meckel   DM-II - Last A1c 6.2 on 07/05/07, well controled. Patient is taking glipizide at home - SSI while inpatient, can resume Glipizide on discharge  - A1C 7.1  HLD - Last LDL was 63 on 07/06/07 - Continue home medications: Lipitor  Atrial Fibrillation, rate controlled  - CHA2DS2-VASc Scoreis 6,  needs oral anticoagulation.  - Patient is not on at home due to dementia and high risk of fall - Continue metoprolol  HTN, essential  - continue amlodipine, metoprolol  GERD: - continue Protonix  CAD (coronary artery disease) - Continue metoprolol and Lipitor  Dementia: - Continue Namenda and donepezil - stable and at baseline   Hypokalemia - with hypomagnesemia - supplemented   DVT prophylaxis: SCD's Code Status: DNR Family Communication: wife Inez Catalina over the phone  Disposition Plan: Home   Consultants:   Urology   Procedures:   TURP 9/17  Antimicrobials:   Cipro 9/16 --> 9/18  Procedures/Studies: Ct Abdomen Pelvis Wo Contrast  Result Date: 10/20/2015 CLINICAL DATA:  History of sigmoid diverticulitis on previous CT scan. Patient has previous history of diarrhea and lower abdominal pain. EXAM: CT ABDOMEN AND PELVIS WITHOUT CONTRAST TECHNIQUE: Multidetector CT imaging of the abdomen and pelvis was performed following the standard protocol without IV contrast. COMPARISON:  10/17/2015 FINDINGS: Lower chest: Respiratory motion artifact at the bilateral lung bases. No interval development of focal consolidation or pleural effusion. Vascular calcifications are present within the aorta. There are coronary artery calcifications. Intracardiac leads remain in place. Hepatobiliary: Scattered calcifications in the liver consistent with granuloma. Non contrasted liver appearance otherwise unremarkable. Patient is status post cholecystectomy Pancreas: Mildly atrophic Spleen: Spleen contains multiple calcifications consistent with prior granulomatous disease. Adrenals/Urinary Tract: Bilateral adrenal glands are within normal limits. Nonspecific bilateral perinephric fat stranding. No focal calcifications. Bilateral renal pelvis appear enlarged compared to prior. There is mild right hydronephrosis of the lower pole calices. Right ureter is also prominent. No stones identified along  the  course of the ureters. 11 mm round hypodensity along the posterior medial cortex of the mid left kidney, not seen on contrasted exam. The bladder is distended with urine. No calcifications identified within the bladder. Stomach/Bowel: The stomach is collapsed. No dilated small bowel. There is contrast material within the distal small bowel and colon. Numerous sigmoid diverticular again evident with slight wall thickening of the sigmoid colon. No increasing inflammation is present. No focal gas collections to suggest perforation. Appendix not identified. Vascular/Lymphatic: Dense vascular calcifications are present within the aorta and branch vessels. There are no pathologically enlarged mesenteric or retroperitoneal nodes. Small bilateral inguinal nodes are present. Reproductive: Prostate gland appears slightly enlarged and exerts mild mass effect on the posterior bladder. Other: Trace amount of free fluid in the pelvis, similar compared to prior exam. No free air. Musculoskeletal: Stable sclerotic focus in the left sacrum. No acute osseous abnormality. IMPRESSION: 1. Sigmoid colon diverticula with mild wall thickening suggests mild diverticulitis. No increasing inflammation is present. There is no evidence for perforation or focal abscess. Trace amount of free fluid in the pelvis. 2. Evidence of prior granulomatous disease involving the liver and spleen. 3. Nonspecific bilateral perinephric fat stranding. Interim development of mild right hydronephrosis and mild dilatation of left renal pelvis, possibly secondary to distended urinary bladder. Not seen is an 11 mm hypodensity along the posterior medial cortex of left kidney ; this is of uncertain etiology as it was not identified on contrasted exam. It could represent small nonspecific focal perinephric fluid. 4. Atherosclerotic vascular disease Electronically Signed   By: Donavan Foil M.D.   On: 10/20/2015 13:52   Dg Chest 2 View  Result Date:  10/27/2015 CLINICAL DATA:  Chest pain. EXAM: CHEST  2 VIEW COMPARISON:  10/17/2015 FINDINGS: Dual lead left-sided pacemaker remains in place. There is stable cardiomegaly. Improved lung aeration from prior. Previous left lung base and right midlung zone opacities have resolved. Minimal streaky residual atelectasis or scarring in the left lung base. Mild vascular congestion persists but is improved. No pleural fluid or pneumothorax. No acute osseous abnormality. IMPRESSION: Improved lung aeration from prior with have resolved left basilar and right midlung zone opacities. Stable cardiomegaly.  Improved vascular congestion. Electronically Signed   By: Jeb Levering M.D.   On: 10/27/2015 23:11   Ct Head Wo Contrast  Result Date: 10/17/2015 CLINICAL DATA:  Acute encephalopathy EXAM: CT HEAD WITHOUT CONTRAST TECHNIQUE: Contiguous axial images were obtained from the base of the skull through the vertex without intravenous contrast. COMPARISON:  Head CT 06/19/2009 FINDINGS: Brain: No mass lesion, intraparenchymal hemorrhage or extra-axial collection. No evidence of acute cortical infarct. There is confluent hypoattenuation within the periventricular white matter compatible with advanced chronic microvascular ischemia. No hydrocephalus. Vascular: Hyper attenuating appearance of the vessels is likely secondary to contrast agent administration earlier the same day for a different study. Skull: Normal visualized skull base, calvarium and extracranial soft tissues. Sinuses/Orbits: Partial opacification of the left posterior ethmoid sinus. No mastoid effusion or paranasal sinus fluid level. Normal orbits. IMPRESSION: 1. No acute intracranial abnormality. 2. Advanced chronic microvascular ischemic disease. Electronically Signed   By: Ulyses Jarred M.D.   On: 10/17/2015 22:13   Ct Abdomen Pelvis W Contrast  Result Date: 10/17/2015 CLINICAL DATA:  Diarrhea and right lower quadrant pain for 2 days EXAM: CT ABDOMEN AND  PELVIS WITH CONTRAST TECHNIQUE: Multidetector CT imaging of the abdomen and pelvis was performed using the standard protocol following bolus administration of intravenous contrast. CONTRAST:  47mL ISOVUE-300 IOPAMIDOL (ISOVUE-300) INJECTION 61% COMPARISON:  None. FINDINGS: Lower chest: No focal infiltrate or sizable effusion is noted. Multiple calcified hilar lymph nodes are seen. Coronary calcifications are noted. Hepatobiliary: The gallbladder has been surgically removed. The liver is within normal limits. Pancreas: No mass, inflammatory changes, or other significant abnormality. Spleen: Multiple calcified granulomas are seen. Adrenals/Urinary Tract: The adrenal glands are within normal limits. Kidneys demonstrate normal enhancement bilaterally with bilateral excretion. No renal calculi or obstructive changes are noted. The bladder is decompressed. Stomach/Bowel: The appendix has been surgically removed. Scattered diverticular changes are noted particularly in the sigmoid colon. Some very early inflammatory changes are noted along the sigmoid colon which may represent early diverticulitis. No perforation or abscess formation is noted. Vascular/Lymphatic: Aortoiliac calcifications are noted without aneurysmal dilatation. No significant lymphadenopathy is noted. Reproductive: Prostate is mildly enlarged indenting upon the bladder inferiorly. Other: None. Musculoskeletal: Degenerative changes of the lumbar spine are noted. IMPRESSION: Changes suggestive of early diverticulitis in the sigmoid colon. Prostatic enlargement. Postsurgical changes without acute abnormality. Electronically Signed   By: Inez Catalina M.D.   On: 10/17/2015 20:41   Dg Chest Port 1 View  Result Date: 10/17/2015 CLINICAL DATA:  Acute onset of leukocytosis.  Initial encounter. EXAM: PORTABLE CHEST 1 VIEW COMPARISON:  Chest radiograph performed 09/05/2013 FINDINGS: The lungs are well-aerated. Mild vascular congestion is noted. Mild left basilar  and right mid lung opacities could reflect pneumonia. There is no evidence of pleural effusion or pneumothorax. The cardiomediastinal silhouette is borderline enlarged. A pacemaker is noted overlying the left chest wall, with leads ending overlying the right atrium and right ventricle. No acute osseous abnormalities are seen. IMPRESSION: Mild left basilar and right mid lung opacities could reflect pneumonia. Underlying mild vascular congestion and borderline cardiomegaly. Followup PA and lateral chest X-ray is recommended in 3-4 weeks following trial of antibiotic therapy to ensure resolution and exclude underlying malignancy. Electronically Signed   By: Garald Balding M.D.   On: 10/17/2015 21:52    Discharge Exam: Vitals:   10/31/15 0915 10/31/15 1244  BP: (!) 151/71 (!) 157/80  Pulse: 66 64  Resp:  20  Temp:  98.9 F (37.2 C)   Vitals:   10/30/15 2139 10/31/15 0421 10/31/15 0915 10/31/15 1244  BP: (!) 157/74 (!) 169/72 (!) 151/71 (!) 157/80  Pulse: 65 (!) 58 66 64  Resp: 18 20  20   Temp: 99.6 F (37.6 C) 98.1 F (36.7 C)  98.9 F (37.2 C)  TempSrc: Oral Oral  Oral  SpO2: 100% 98%  99%  Weight:      Height:        General: Pt is alert, follows commands appropriately, not in acute distress Cardiovascular: Regular rate and rhythm, no rubs, no gallops Respiratory: Clear to auscultation bilaterally, no wheezing, no crackles, no rhonchi Abdominal: Soft, non tender, non distended, bowel sounds +, no guarding  Discharge Instructions  Discharge Instructions    Diet - low sodium heart healthy    Complete by:  As directed    Increase activity slowly    Complete by:  As directed        Medication List    STOP taking these medications   metroNIDAZOLE 500 MG tablet Commonly known as:  FLAGYL     TAKE these medications   ALPRAZolam 0.25 MG tablet Commonly known as:  XANAX Take 0.25 mg by mouth 2 (two) times daily.   amLODipine 5 MG tablet Commonly known as:  NORVASC Take  2.5 mg by mouth daily.   aspirin 81 MG tablet Take 81 mg by mouth daily.   atorvastatin 20 MG tablet Commonly known as:  LIPITOR Take 20 mg by mouth every evening.   bacitracin ointment Apply topically 3 (three) times daily.   cefUROXime 500 MG tablet Commonly known as:  CEFTIN Take 1 tablet (500 mg total) by mouth 2 (two) times daily with a meal.   donepezil 23 MG Tabs tablet Commonly known as:  ARICEPT Take 1 tablet (23 mg total) by mouth at bedtime.   ferrous sulfate 325 (65 FE) MG tablet Take 325 mg by mouth 2 (two) times daily.   glipiZIDE 10 MG tablet Commonly known as:  GLUCOTROL Take 10 mg by mouth daily.   HYDROcodone-acetaminophen 5-325 MG tablet Commonly known as:  NORCO/VICODIN Take 1 tablet by mouth every 6 (six) hours as needed for moderate pain or severe pain.   isosorbide mononitrate 30 MG 24 hr tablet Commonly known as:  IMDUR Take 30 mg by mouth daily.   memantine 10 MG tablet Commonly known as:  NAMENDA Take 10 mg by mouth 2 (two) times daily.   metoprolol succinate 25 MG 24 hr tablet Commonly known as:  TOPROL-XL Take 1 tablet (25 mg total) by mouth daily. Take with or immediately following a meal.   ondansetron 8 MG disintegrating tablet Commonly known as:  ZOFRAN-ODT DIS 1/2 T PO Q 8 H PRF NAUSEA   pantoprazole 40 MG tablet Commonly known as:  PROTONIX Take 40 mg by mouth daily.   pramipexole 0.125 MG tablet Commonly known as:  MIRAPEX Take 2 tablets (0.25 mg total) by mouth every evening. Two hours before bedtime   sertraline 50 MG tablet Commonly known as:  ZOLOFT Take 50 mg by mouth daily.   SSD 1 % cream Generic drug:  silver sulfADIAZINE Apply 1 application topically daily. APPLY 1 APPLICATION TO AFFECTED AREA ONCE DAILY FOR 14 DAYS   tamsulosin 0.4 MG Caps capsule Commonly known as:  FLOMAX Take 1 capsule (0.4 mg total) by mouth daily.      Follow-up Information    Haywood Pao, MD .   Specialty:  Internal  Medicine Contact information: [place] [address] [phone]        Ardis Hughs, MD. Schedule an appointment as soon as possible for a visit today.   Specialty:  Urology Contact information: [place] [address] [phone]            The results of significant diagnostics from this hospitalization (including imaging, microbiology, ancillary and laboratory) are listed below for reference.     Microbiology: No results found for this or any previous visit (from the past 240 hour(s)).   Labs: Basic Metabolic Panel:  Recent Labs Lab 10/28/15 0425 10/28/15 1255 10/29/15 0336 10/30/15 0400 10/31/15 0353  NA 142 140 140 140 138  K 3.1* 3.5 3.9 3.6 3.6  CL 106 106 106 106 102  CO2 30 28 27 29 27   GLUCOSE 143* 178* 137* 137* 121*  BUN 14 13 12 13 13   CREATININE 1.39* 1.32* 1.17 1.16 1.18  CALCIUM 8.2* 8.1* 8.2* 8.3* 8.3*  MG 1.6*  --   --  1.7  --    CBC:  Recent Labs Lab 10/25/15 1533  10/28/15 0425 10/28/15 1255 10/29/15 0336 10/30/15 0400 10/31/15 0353  WBC 10.2  < > 9.6 10.1 11.9* 10.7* 12.3*  NEUTROABS 6.7  --   --   --   --   --   --  HGB 14.0  < > 12.6* 13.5 13.5 12.3* 13.9  HCT 39.5  < > 37.4* 40.5 40.4 36.3* 40.2  MCV 87.2  < > 90.1 90.6 91.0 88.8 91.2  PLT 260  < > 298 320 309 279 279  < > = values in this interval not displayed. Cardiac Enzymes:  Recent Labs Lab 10/27/15 2143 10/28/15 0425 10/28/15 1255  TROPONINI <0.03 <0.03 <0.03   CBG:  Recent Labs Lab 10/30/15 1139 10/30/15 1653 10/30/15 2231 10/31/15 0738 10/31/15 1149  GLUCAP 249* 90 139* 150* 143*   SIGNED: Time coordinating discharge: 30 minutes  MAGICK-Alonte Wulff, MD  [facility] 10/31/2015, 1:02 PM Pager [phone]  If 7PM-7AM, please contact night-coverage [email or web address] Password [email or web address]

## 2015-10-31 NOTE — Discharge Instructions (Signed)
Acute Urinary Retention, Male °Acute urinary retention is the temporary inability to urinate. °This is a common problem in older men. As men age their prostates become larger and block the flow of urine from the bladder. This is usually a problem that has come on gradually.  °HOME CARE INSTRUCTIONS °If you are sent home with a Foley catheter and a drainage system, you will need to discuss the best course of action with your health care provider. While the catheter is in, maintain a good intake of fluids. Keep the drainage bag emptied and lower than your catheter. This is so that contaminated urine will not flow back into your bladder, which could lead to a urinary tract infection. °There are two main types of drainage bags. One is a large bag that usually is used at night. It has a good capacity that will allow you to sleep through the night without having to empty it. The second type is called a leg bag. It has a smaller capacity, so it needs to be emptied more frequently. However, the main advantage is that it can be attached by a leg strap and can go underneath your clothing, allowing you the freedom to move about or leave your home. °Only take over-the-counter or prescription medicines for pain, discomfort, or fever as directed by your health care provider.  °SEEK MEDICAL CARE IF: °· You develop a low-grade fever. °· You experience spasms or leakage of urine with the spasms. °SEEK IMMEDIATE MEDICAL CARE IF:  °· You develop chills or fever. °· Your catheter stops draining urine. °· Your catheter falls out. °· You start to develop increased bleeding that does not respond to rest and increased fluid intake. °MAKE SURE YOU: °· Understand these instructions. °· Will watch your condition. °· Will get help right away if you are not doing well or get worse. °  °This information is not intended to replace advice given to you by your health care provider. Make sure you discuss any questions you have with your health care  provider. °  °Document Released: 05/05/2000 Document Revised: 06/13/2014 Document Reviewed: 07/08/2012 °Elsevier Interactive Patient Education ©2016 [employer] ° °

## 2015-11-02 DIAGNOSIS — E1122 Type 2 diabetes mellitus with diabetic chronic kidney disease: Secondary | ICD-10-CM | POA: Diagnosis not present

## 2015-11-02 DIAGNOSIS — F0390 Unspecified dementia without behavioral disturbance: Secondary | ICD-10-CM | POA: Diagnosis not present

## 2015-11-02 DIAGNOSIS — N183 Chronic kidney disease, stage 3 (moderate): Secondary | ICD-10-CM | POA: Diagnosis not present

## 2015-11-02 DIAGNOSIS — D649 Anemia, unspecified: Secondary | ICD-10-CM | POA: Diagnosis not present

## 2015-11-02 DIAGNOSIS — I4891 Unspecified atrial fibrillation: Secondary | ICD-10-CM | POA: Diagnosis not present

## 2015-11-02 DIAGNOSIS — Z7984 Long term (current) use of oral hypoglycemic drugs: Secondary | ICD-10-CM | POA: Diagnosis not present

## 2015-11-02 DIAGNOSIS — R339 Retention of urine, unspecified: Secondary | ICD-10-CM | POA: Diagnosis not present

## 2015-11-02 DIAGNOSIS — E1151 Type 2 diabetes mellitus with diabetic peripheral angiopathy without gangrene: Secondary | ICD-10-CM | POA: Diagnosis not present

## 2015-11-02 DIAGNOSIS — I2510 Atherosclerotic heart disease of native coronary artery without angina pectoris: Secondary | ICD-10-CM | POA: Diagnosis not present

## 2015-11-02 DIAGNOSIS — F329 Major depressive disorder, single episode, unspecified: Secondary | ICD-10-CM | POA: Diagnosis not present

## 2015-11-02 DIAGNOSIS — I129 Hypertensive chronic kidney disease with stage 1 through stage 4 chronic kidney disease, or unspecified chronic kidney disease: Secondary | ICD-10-CM | POA: Diagnosis not present

## 2015-11-02 DIAGNOSIS — I10 Essential (primary) hypertension: Secondary | ICD-10-CM | POA: Diagnosis not present

## 2015-11-02 DIAGNOSIS — E785 Hyperlipidemia, unspecified: Secondary | ICD-10-CM | POA: Diagnosis not present

## 2015-11-02 DIAGNOSIS — E1144 Type 2 diabetes mellitus with diabetic amyotrophy: Secondary | ICD-10-CM | POA: Diagnosis not present

## 2015-11-02 DIAGNOSIS — N401 Enlarged prostate with lower urinary tract symptoms: Secondary | ICD-10-CM | POA: Diagnosis not present

## 2015-11-02 DIAGNOSIS — I25810 Atherosclerosis of coronary artery bypass graft(s) without angina pectoris: Secondary | ICD-10-CM | POA: Diagnosis not present

## 2015-11-07 DIAGNOSIS — E1151 Type 2 diabetes mellitus with diabetic peripheral angiopathy without gangrene: Secondary | ICD-10-CM | POA: Diagnosis not present

## 2015-11-07 DIAGNOSIS — E1122 Type 2 diabetes mellitus with diabetic chronic kidney disease: Secondary | ICD-10-CM | POA: Diagnosis not present

## 2015-11-07 DIAGNOSIS — N183 Chronic kidney disease, stage 3 (moderate): Secondary | ICD-10-CM | POA: Diagnosis not present

## 2015-11-07 DIAGNOSIS — N401 Enlarged prostate with lower urinary tract symptoms: Secondary | ICD-10-CM | POA: Diagnosis not present

## 2015-11-07 DIAGNOSIS — R339 Retention of urine, unspecified: Secondary | ICD-10-CM | POA: Diagnosis not present

## 2015-11-07 DIAGNOSIS — I129 Hypertensive chronic kidney disease with stage 1 through stage 4 chronic kidney disease, or unspecified chronic kidney disease: Secondary | ICD-10-CM | POA: Diagnosis not present

## 2015-11-09 ENCOUNTER — Telehealth: Payer: Self-pay

## 2015-11-09 DIAGNOSIS — N183 Chronic kidney disease, stage 3 (moderate): Secondary | ICD-10-CM | POA: Diagnosis not present

## 2015-11-09 DIAGNOSIS — E1151 Type 2 diabetes mellitus with diabetic peripheral angiopathy without gangrene: Secondary | ICD-10-CM | POA: Diagnosis not present

## 2015-11-09 DIAGNOSIS — R339 Retention of urine, unspecified: Secondary | ICD-10-CM | POA: Diagnosis not present

## 2015-11-09 DIAGNOSIS — I129 Hypertensive chronic kidney disease with stage 1 through stage 4 chronic kidney disease, or unspecified chronic kidney disease: Secondary | ICD-10-CM | POA: Diagnosis not present

## 2015-11-09 DIAGNOSIS — N401 Enlarged prostate with lower urinary tract symptoms: Secondary | ICD-10-CM | POA: Diagnosis not present

## 2015-11-09 DIAGNOSIS — E1122 Type 2 diabetes mellitus with diabetic chronic kidney disease: Secondary | ICD-10-CM | POA: Diagnosis not present

## 2015-11-09 NOTE — Telephone Encounter (Signed)
Faxed signed perioperative prescription for implanted cardiac device programming to [place].

## 2015-11-13 ENCOUNTER — Encounter: Payer: Self-pay | Admitting: Cardiovascular Disease

## 2015-11-13 ENCOUNTER — Ambulatory Visit (INDEPENDENT_AMBULATORY_CARE_PROVIDER_SITE_OTHER): Payer: Medicare Other | Admitting: Cardiovascular Disease

## 2015-11-13 VITALS — BP 139/72 | HR 62 | Ht 72.0 in | Wt 199.6 lb

## 2015-11-13 DIAGNOSIS — E782 Mixed hyperlipidemia: Secondary | ICD-10-CM

## 2015-11-13 DIAGNOSIS — Z950 Presence of cardiac pacemaker: Secondary | ICD-10-CM

## 2015-11-13 DIAGNOSIS — I700 Atherosclerosis of aorta: Secondary | ICD-10-CM

## 2015-11-13 DIAGNOSIS — I472 Ventricular tachycardia: Secondary | ICD-10-CM

## 2015-11-13 DIAGNOSIS — I482 Chronic atrial fibrillation, unspecified: Secondary | ICD-10-CM

## 2015-11-13 DIAGNOSIS — I4729 Other ventricular tachycardia: Secondary | ICD-10-CM

## 2015-11-13 DIAGNOSIS — I2510 Atherosclerotic heart disease of native coronary artery without angina pectoris: Secondary | ICD-10-CM | POA: Diagnosis not present

## 2015-11-13 DIAGNOSIS — I10 Essential (primary) hypertension: Secondary | ICD-10-CM | POA: Diagnosis not present

## 2015-11-13 DIAGNOSIS — F0150 Vascular dementia without behavioral disturbance: Secondary | ICD-10-CM

## 2015-11-13 DIAGNOSIS — E1159 Type 2 diabetes mellitus with other circulatory complications: Secondary | ICD-10-CM

## 2015-11-13 LAB — CUP PACEART INCLINIC DEVICE CHECK
Battery Voltage: 2.93 V
Brady Statistic AP VP Percent: 0.76 %
Brady Statistic AP VS Percent: 0.08 %
Brady Statistic AS VS Percent: 31.87 %
Brady Statistic RV Percent Paced: 68.05 %
Implantable Lead Implant Date: 20110308
Implantable Lead Location: 753859
Implantable Lead Model: 5076
Implantable Lead Model: 5076
Lead Channel Impedance Value: 456 Ohm
Lead Channel Impedance Value: 464 Ohm
Lead Channel Sensing Intrinsic Amplitude: 0.4 mV
Lead Channel Sensing Intrinsic Amplitude: 7.5 mV
Lead Channel Setting Pacing Amplitude: 2.5 V
MDC IDC LEAD IMPLANT DT: 20110308
MDC IDC LEAD LOCATION: 753860
MDC IDC MSMT LEADCHNL RV PACING THRESHOLD AMPLITUDE: 1 V
MDC IDC MSMT LEADCHNL RV PACING THRESHOLD PULSEWIDTH: 0.4 ms
MDC IDC SESS DTM: 20171003155543
MDC IDC SET LEADCHNL RA PACING AMPLITUDE: 2 V
MDC IDC SET LEADCHNL RV PACING PULSEWIDTH: 0.4 ms
MDC IDC SET LEADCHNL RV SENSING SENSITIVITY: 0.9 mV
MDC IDC STAT BRADY AS VP PERCENT: 67.29 %
MDC IDC STAT BRADY RA PERCENT PACED: 0.84 %

## 2015-11-13 MED ORDER — METOPROLOL SUCCINATE ER 25 MG PO TB24: 12.5000 mg | ORAL_TABLET | Freq: Every day | ORAL | 11 refills | Status: AC

## 2015-11-13 NOTE — Progress Notes (Signed)
Patient ID: Jon Davis, male   DOB: July 18, 1926, 82 y.o.   MRN: [id number]    Cardiology Office Note    Date:  11/14/2015   ID:  COLBE MCDONNEL, DOB [birth year], MRN [id number]  PCP:  Haywood Pao, MD  Cardiologist:   Sanda Klein, MD   Chief Complaint  Patient presents with  . Follow-up.  Pacemaker check    Pt has no complaints     History of Present Illness:  Jon Davis is a 82 y.o. male with a history of coronary artery disease, sinus node dysfunction with a dual-chamber permanent pacemaker, recurrent paroxysmal atrial fibrillation, severe atherosclerosis of the thoracic aorta, hypertension, hyperlipidemia, type 2 diabetes mellitus and progressive dementia.  [name] and his wife, [name], are residents at [facility] for about a year now. His memory has been steadily deteriorating despite the fact that he takes 2 cholinesterase inhibitors. It no longer seems possible to obtain a reliable review of systems from him. He remains his very pleasant and cheerful self, but responds to numerous questions with a smile" I don't know" or I'm not sure". His activity level has decreased in parallel going from 2 hours last November to 20 minutes a day now.   At his last appointment with decreased dose of beta blocker since he had demonstrated a marked increase in ventricular pacing. Ventricular pacing has decreased somewhat, now down to 67%. There have been no episodes of high ventricular rate. He has been in persistent atrial fibrillation for at least the last 12 months.  He has not had syncope or falls. He denies any chest discomfort or problems with shortness of breath. He does have mild leg edema on exam, but he has not really paid attention to it. He has not had focal neurological deficits. He is followed by Dr. Brett Fairy in the neurology clinic. He is accompanied today by a caregiver who confirms that he has not had falls. He required a urological procedure (dorsal slit for  phimosis and TURP) for urinary retention and still has occasional hematuria although almost than a month has passed since surgery.  Pacemaker interrogation today shows a normally functioning dual-chamber Medtronic EnRhythm device. He has been in persistent atrial fibrillation without pause since early November. Ventricular lead parameters are normal. Atrial lead parameters cannot be tested due to atrial fibrillation. There have been no new episodes of ventricular tachycardia. Ventricular rate control has been uniformly good. Battery voltage is 2.93 V (RRT 2.81 V). Ventricular pacing has decreased from 83% to 67%. He is extremely sedentary.   He has had recurrent problems with iron deficiency anemia and for this reason is currently taking only aspirin for prophylaxis rather than warfarin or another anticoagulant. He has severe aortic atherosclerosis with ulcerated mobile plaque demonstrated in the descending thoracic aorta by previous TEE. He has well-controlled type 2 diabetes mellitus and hyperlipidemia on statin therapy. He received a dual-chamber permanent pacemaker in 2011 for symptomatic sinus bradycardia. Both before and after pacemaker implantation he has had occasional brief episodes of nonsustained ventricular tachycardia and atrial tachycardia, requiring beta blocker therapy.  Past Medical History:  Diagnosis Date  . Anemia 06/2012  . Atrial fibrillation ([place])   . BPH (benign prostatic hyperplasia)    Followed by Dr. Matilde Sprang  . Brady-tachy syndrome ([place])    04/17/2009 MDT pacer Enrhythmatr fib  . CAD (coronary artery disease)   . Dementia in Alzheimer's disease   . Depression   . Diabetes ([place])   .  Diverticulosis 02/26/2010   Sigmoid colon  . Duodenitis 02/26/2010  . Elevated PSA   . Episodic VTach, pacer    Followed by Dr. Avon Gully  . Fall 03/2014   elbow fluid   . Gastritis 02/26/2010   moderate  . Hiatal hernia 02/26/2010  . Hyperlipidemia   . Hypertension   . Memory loss    . Microalbuminuria   . Urinary incontinence     Past Surgical History:  Procedure Laterality Date  . APPENDECTOMY  1948  . CARDIAC CATHETERIZATION  07/07/2007   mild to mod. ca+ w/narrowing of 20% ostium of second diagonal,diffuse 80% stenosis small caliber infer. branch,20% mid LAD,60-70% narrowing mid atrioventricular groove CX,mild 20-30% RCA  . CAROTID DOPPLER  10/01/2006   0-49% right bulb,prox,ECA & prox ICA,0-49% left subclavian bulb & prox ICA,right & left ICA abn. resistance  . Cataracts    . CHOLECYSTECTOMY    . CIRCUMCISION N/A 10/28/2015   Procedure: CIRCUMCISION ADULT, DORSAL SLIT;  Surgeon: Ardis Hughs, MD;  Location: [facility];  Service: Urology;  Laterality: N/A;  . NM MYOVIEW LTD  10/01/2010   No ischemia  . PERMANENT PACEMAKER INSERTION  04/17/2009   MDT Enrhythm  . TRANSURETHRAL RESECTION OF PROSTATE N/A 10/28/2015   Procedure: TRANSURETHRAL RESECTION OF THE PROSTATE (TURP);  Surgeon: Ardis Hughs, MD;  Location: [facility];  Service: Urology;  Laterality: N/A;    Current Medications: Outpatient Medications Prior to Visit  Medication Sig Dispense Refill  . ALPRAZolam (XANAX) 0.25 MG tablet Take 0.25 mg by mouth 2 (two) times daily.     [name] amLODipine (NORVASC) 5 MG tablet Take 2.5 mg by mouth daily.     [name] aspirin 81 MG tablet Take 81 mg by mouth daily.    [name] atorvastatin (LIPITOR) 20 MG tablet Take 20 mg by mouth every evening.     . bacitracin ointment Apply topically 3 (three) times daily. 120 g 0  . cefUROXime (CEFTIN) 500 MG tablet Take 1 tablet (500 mg total) by mouth 2 (two) times daily with a meal. 10 tablet 0  . donepezil (ARICEPT) 23 MG TABS tablet Take 1 tablet (23 mg total) by mouth at bedtime. 30 tablet 0  . ferrous sulfate 325 (65 FE) MG tablet Take 325 mg by mouth 2 (two) times daily.    [name] glipiZIDE (GLUCOTROL) 10 MG tablet Take 10 mg by mouth daily.  5  . HYDROcodone-acetaminophen (NORCO/VICODIN) 5-325 MG per tablet Take 1 tablet by mouth every 6  (six) hours as needed for moderate pain or severe pain.     . isosorbide mononitrate (IMDUR) 30 MG 24 hr tablet Take 30 mg by mouth daily.    . memantine (NAMENDA) 10 MG tablet Take 10 mg by mouth 2 (two) times daily.    . ondansetron (ZOFRAN-ODT) 8 MG disintegrating tablet DIS 1/2 T PO Q 8 H PRF NAUSEA  0  . pantoprazole (PROTONIX) 40 MG tablet Take 40 mg by mouth daily.    . pramipexole (MIRAPEX) 0.125 MG tablet Take 2 tablets (0.25 mg total) by mouth every evening. Two hours before bedtime 90 tablet 3  . sertraline (ZOLOFT) 50 MG tablet Take 50 mg by mouth daily.    [name] SSD 1 % cream Apply 1 application topically daily. APPLY 1 APPLICATION TO AFFECTED AREA ONCE DAILY FOR 14 DAYS  0  . tamsulosin (FLOMAX) 0.4 MG CAPS capsule Take 1 capsule (0.4 mg total) by mouth daily. 30 capsule 0  . metoprolol succinate (TOPROL-XL)  25 MG 24 hr tablet Take 1 tablet (25 mg total) by mouth daily. Take with or immediately following a meal. 30 tablet 3   No facility-administered medications prior to visit.      Allergies:   Penicillins   Social History   Social History  . Marital status: Married    Spouse name: [name]   . Number of children: 2  . Years of education: 12   Occupational History  .  Retired  . retired    Social History Main Topics  . Smoking status: Former Research scientist (life sciences)  . Smokeless tobacco: Former Systems developer    Quit date: 02/09/1974  . Alcohol use No  . Drug use: No  . Sexual activity: No   Other Topics Concern  . None   Social History Narrative   Patient lives at home with wife [place].    Patient has 2 children.    Patient has a high school education.    Patient retired.    Patient is right handed.      Family History:  The patient's family history includes Alzheimer's disease in his mother; Heart disease in his father.   ROS:   Please see the history of present illness.    ROS All other systems reviewed and are negative.   PHYSICAL EXAM:   VS:  BP 139/72 (BP Location: Left Arm,  Patient Position: Sitting, Cuff Size: Normal)   Pulse 62   Ht 6' (1.829 m)   Wt 90.5 kg (199 lb 9.6 oz)   BMI 27.07 kg/m    GEN: Well nourished, well developed, in no acute distress  HEENT: normal  Neck: no JVD, carotid bruits, or masses Cardiac: Paradoxically split second heart sound, RRR; no murmurs, rubs, or gallops,no edema, Healthy left subclavian pacemaker site  Respiratory:  clear to auscultation bilaterally, normal work of breathing GI: soft, nontender, nondistended, + BS MS: no deformity or atrophy  Skin: warm and dry, no rash Neuro:  Alert and Oriented x 2, Strength and sensation are intact Psych: euthymic mood, full affect, or short-term memory is evident  Wt Readings from Last 3 Encounters:  11/13/15 90.5 kg (199 lb 9.6 oz)  10/27/15 89.9 kg (198 lb 3.1 oz)  10/21/15 88.3 kg (194 lb 10.7 oz)      Studies/Labs Reviewed:   EKG:  EKG is ordered today.  The ekg ordered today demonstrates background atrial fibrillation with 100% ventricular pacing   ASSESSMENT:    1. Coronary artery disease involving native coronary artery of native heart without angina pectoris   2. Chronic atrial fibrillation (HCC)   3. Aortic atherosclerosis ([place])   4. Essential hypertension   5. Nonsustained ventricular tachycardia ([place])   6. Mixed hyperlipidemia   7. Type 2 diabetes mellitus with vascular disease ([place])   8. Pacemaker   9. Vascular dementia without behavioral disturbance      PLAN:  In order of problems listed above:  1. AFib: As far as I can tell this is asymptomatic, but has led to high burden of ventricular pacing. We'll further decrease the dose of metoprolol, down to 12.5 mg daily. The arrhythmia is likely now permanent. He took anticoagulation in the past, but this was discontinued when he developed intractable iron deficiency anemia. Both his embolic risk and his bleeding risk are high. CHADSVasc 5 (age 50, HTN, CAD, DM). Overall, he is a poor candidate for long-term  anticoagulation especially when one takes into account his history of falls and advancing dementia. Cardioversion and  antiarrhythmic therapy is neither justified nor safe. Continue aspirin for stroke prophylaxis. 2. CAD: Asymptomatic, focus on risk factor modification. If he develops more angina on lower dose metoprolol, will use other antianginal medications such as long-acting nitrates. 3. Aortic atherosclerosis this was present throughout but most obvious in the descending thoracic aorta, where there were large mobile structures. No obvious embolic events. If he should need angiography in the future, prefer radial approach. With his climbing cognitive status, it is unlikely that invasive procedures with actually be indicated, with the possible exception of a true ST segment elevation infarction with hemodynamic compromise. 4. HTN: good blood pressure control I doubt that the further slight reduction in beta blocker dose will make a big impact on his blood pressure. Would not increase antihypertensives unless his systolic blood pressure is 150 or greater.  5. NSVT: no significant events on current device check. 6. HLP: target LDL<70. 7. DM: Fair control, most recent hemoglobin A1c 7.1%  8. Pacemaker: Normal device function. Switched device mode to VVIR to conserve battery. Recheck ventricular pacing percentage in 3 months and consider discontinuing the beta blocker altogether. Need to ensure that he continues to do downloads every 3 months now that he has moved to assisted living. We cannot rely on his own memory to perform the downloads. This is especially important since his particular pacemaker model is sometimes prone to unexpected battery depletion. Fortunately , he is not pacemaker dependent   Medication Adjustments/Labs and Tests Ordered: Current medicines are reviewed at length with the patient today.  Concerns regarding medicines are outlined above.  Medication changes, Labs and Tests ordered  today are listed in the Patient Instructions below. Patient Instructions  Dr Sallyanne Kuster has recommended making the following medication changes: 1. DECREASE Metoprolol to 12.5 mg daily  Please check vital signs 3 times weekly. Call the office if your systolic blood pressure (top number) is higher than 150 mmHg or if your heart rate is higher than 100 bpm.  Dr Sallyanne Kuster recommends that you schedule a follow-up appointment in 3 months with a pacemaker check.  If you need a refill on your cardiac medications before your next appointment, please call your pharmacy.    Signed, Sanda Klein, MD  11/14/2015 8:31 AM    [facility] [address], [place], [place]  [address] Phone: [phone]; Fax: [phone]

## 2015-11-13 NOTE — Patient Instructions (Signed)
Dr Sallyanne Kuster has recommended making the following medication changes: 1. DECREASE Metoprolol to 12.5 mg daily  Please check vital signs 3 times weekly. Call the office if your systolic blood pressure (top number) is higher than 150 mmHg or if your heart rate is higher than 100 bpm.  Dr Sallyanne Kuster recommends that you schedule a follow-up appointment in 3 months with a pacemaker check.  If you need a refill on your cardiac medications before your next appointment, please call your pharmacy.

## 2015-11-14 ENCOUNTER — Telehealth: Payer: Self-pay | Admitting: Cardiovascular Disease

## 2015-11-14 DIAGNOSIS — N183 Chronic kidney disease, stage 3 (moderate): Secondary | ICD-10-CM | POA: Diagnosis not present

## 2015-11-14 DIAGNOSIS — E1122 Type 2 diabetes mellitus with diabetic chronic kidney disease: Secondary | ICD-10-CM | POA: Diagnosis not present

## 2015-11-14 DIAGNOSIS — E1151 Type 2 diabetes mellitus with diabetic peripheral angiopathy without gangrene: Secondary | ICD-10-CM | POA: Diagnosis not present

## 2015-11-14 DIAGNOSIS — R339 Retention of urine, unspecified: Secondary | ICD-10-CM | POA: Diagnosis not present

## 2015-11-14 DIAGNOSIS — N401 Enlarged prostate with lower urinary tract symptoms: Secondary | ICD-10-CM | POA: Diagnosis not present

## 2015-11-14 DIAGNOSIS — I129 Hypertensive chronic kidney disease with stage 1 through stage 4 chronic kidney disease, or unspecified chronic kidney disease: Secondary | ICD-10-CM | POA: Diagnosis not present

## 2015-11-14 NOTE — Telephone Encounter (Signed)
Spoke to [employer], nurse w [facility]. She is doing 3 times weekly visits w patient for BP checks and HF assessments. Needs continuing orders and I gave verbal OK for 2 additional weeks for home checks and monitoring. Pt w/o complaint or symptoms at visit today. Notes assessment findings OK and no obvious edema, weight gain, shortness of breath.  She reports discrepancy between auto and manual BPs -- manual readings are more accurate and there was variability within the automated readings. Advised to record manually and use as reference for notifications.  Pt was seen recently and had metoprolol adjusted. Advised RN to consider that this medication change may produce some changes to VS, continue to monitor and report any high BPs or HRs to [place] and have patient call if any symptoms. RN voiced thanks and understanding. She will send sheets for continued home health orders to our clinical fax #, which I provided.

## 2015-11-14 NOTE — Telephone Encounter (Signed)
New Message  Pt caregiver Tye Maryland call requesting to speak with RN. Tye Maryland states pt wife has an appt with Dr. Sallyanne Kuster on 10/5 at 10:45 and would like to know if it would be ok if pt came with his wife to her appt to get his flu shot. Tye Maryland states pt was to receive shot before release from the hospital, but did not get the shot. Please call back to discuss

## 2015-11-14 NOTE — Telephone Encounter (Signed)
New message    Nurse calling to report that the pts BP reading with the manual instrument was 108/50 and with the automatic cup was 149/97. The right side was 98/54. Please call.

## 2015-11-14 NOTE — Telephone Encounter (Signed)
Due to irregular rhythm (AFib), the automatic BP cuff will often be erroneous. I expect that the BP will increase a little as metoprolol wears off, but would not change treatment unless SBP consistently >150

## 2015-11-14 NOTE — Telephone Encounter (Signed)
Returned call. Home caregiver notes patient comes to wife Jon Davis appts with her - she has appt tomorrow. They are both Dr. Victorino December patients.  Aware that I will send to Dr. Victorino December nurse to review but informed them that providing the flu shot for both patients should not be an issue.

## 2015-11-15 ENCOUNTER — Ambulatory Visit (INDEPENDENT_AMBULATORY_CARE_PROVIDER_SITE_OTHER): Payer: Medicare Other

## 2015-11-15 ENCOUNTER — Other Ambulatory Visit (HOSPITAL_COMMUNITY): Payer: Medicare Other

## 2015-11-15 DIAGNOSIS — Z23 Encounter for immunization: Secondary | ICD-10-CM

## 2015-11-15 NOTE — Telephone Encounter (Signed)
Acknowledged. Flu shot given.

## 2015-11-15 NOTE — Telephone Encounter (Signed)
New message      Patients wife has a 10:45 appt with Dr Sallyanne Kuster today.  Can he come and get a flu shot?

## 2015-11-16 DIAGNOSIS — N401 Enlarged prostate with lower urinary tract symptoms: Secondary | ICD-10-CM | POA: Diagnosis not present

## 2015-11-16 DIAGNOSIS — N183 Chronic kidney disease, stage 3 (moderate): Secondary | ICD-10-CM | POA: Diagnosis not present

## 2015-11-16 DIAGNOSIS — E1122 Type 2 diabetes mellitus with diabetic chronic kidney disease: Secondary | ICD-10-CM | POA: Diagnosis not present

## 2015-11-16 DIAGNOSIS — I129 Hypertensive chronic kidney disease with stage 1 through stage 4 chronic kidney disease, or unspecified chronic kidney disease: Secondary | ICD-10-CM | POA: Diagnosis not present

## 2015-11-16 DIAGNOSIS — E1151 Type 2 diabetes mellitus with diabetic peripheral angiopathy without gangrene: Secondary | ICD-10-CM | POA: Diagnosis not present

## 2015-11-16 DIAGNOSIS — R339 Retention of urine, unspecified: Secondary | ICD-10-CM | POA: Diagnosis not present

## 2015-11-16 NOTE — Telephone Encounter (Signed)
Thanks

## 2015-11-16 NOTE — Telephone Encounter (Signed)
Will forward to Dr. Sallyanne Kuster to make him aware.

## 2015-11-16 NOTE — Telephone Encounter (Signed)
New Message  Jon Davis voiced calling to report vital signs:  BP 134/62 left arm while pt sitting 74-Pulse (60 seconds duration)

## 2015-11-19 ENCOUNTER — Telehealth: Payer: Self-pay | Admitting: Cardiovascular Disease

## 2015-11-19 DIAGNOSIS — E1151 Type 2 diabetes mellitus with diabetic peripheral angiopathy without gangrene: Secondary | ICD-10-CM | POA: Diagnosis not present

## 2015-11-19 DIAGNOSIS — I129 Hypertensive chronic kidney disease with stage 1 through stage 4 chronic kidney disease, or unspecified chronic kidney disease: Secondary | ICD-10-CM | POA: Diagnosis not present

## 2015-11-19 DIAGNOSIS — N183 Chronic kidney disease, stage 3 (moderate): Secondary | ICD-10-CM | POA: Diagnosis not present

## 2015-11-19 DIAGNOSIS — R339 Retention of urine, unspecified: Secondary | ICD-10-CM | POA: Diagnosis not present

## 2015-11-19 DIAGNOSIS — N401 Enlarged prostate with lower urinary tract symptoms: Secondary | ICD-10-CM | POA: Diagnosis not present

## 2015-11-19 DIAGNOSIS — E1122 Type 2 diabetes mellitus with diabetic chronic kidney disease: Secondary | ICD-10-CM | POA: Diagnosis not present

## 2015-11-19 NOTE — Telephone Encounter (Signed)
New message      Calling to report vitals.  Bp 98/62 left arm, pulse 64.  No edema, no sob

## 2015-11-19 NOTE — Telephone Encounter (Signed)
Spoke to RN to inquire. He is asymptomatic. No lightheadedness, dizziness, etc. No further BP fluctuations on standing. Advised to call if orthostatic or other symptoms. Encouraged fluids.  Note that the patient had recent medication changes (metoprolol reduced at last week's OV).  Encouraged to call if BP rechecks continue to be low. Caller voiced understanding and thanks.

## 2015-11-20 ENCOUNTER — Ambulatory Visit: Admit: 2015-11-20 | Payer: Medicare Other | Admitting: Urology

## 2015-11-20 SURGERY — CIRCUMCISION, ADULT
Anesthesia: Choice

## 2015-11-21 DIAGNOSIS — E1151 Type 2 diabetes mellitus with diabetic peripheral angiopathy without gangrene: Secondary | ICD-10-CM | POA: Diagnosis not present

## 2015-11-21 DIAGNOSIS — N401 Enlarged prostate with lower urinary tract symptoms: Secondary | ICD-10-CM | POA: Diagnosis not present

## 2015-11-21 DIAGNOSIS — R339 Retention of urine, unspecified: Secondary | ICD-10-CM | POA: Diagnosis not present

## 2015-11-21 DIAGNOSIS — N183 Chronic kidney disease, stage 3 (moderate): Secondary | ICD-10-CM | POA: Diagnosis not present

## 2015-11-21 DIAGNOSIS — E1122 Type 2 diabetes mellitus with diabetic chronic kidney disease: Secondary | ICD-10-CM | POA: Diagnosis not present

## 2015-11-21 DIAGNOSIS — I129 Hypertensive chronic kidney disease with stage 1 through stage 4 chronic kidney disease, or unspecified chronic kidney disease: Secondary | ICD-10-CM | POA: Diagnosis not present

## 2015-11-23 DIAGNOSIS — N183 Chronic kidney disease, stage 3 (moderate): Secondary | ICD-10-CM | POA: Diagnosis not present

## 2015-11-23 DIAGNOSIS — E1151 Type 2 diabetes mellitus with diabetic peripheral angiopathy without gangrene: Secondary | ICD-10-CM | POA: Diagnosis not present

## 2015-11-23 DIAGNOSIS — R339 Retention of urine, unspecified: Secondary | ICD-10-CM | POA: Diagnosis not present

## 2015-11-23 DIAGNOSIS — E1122 Type 2 diabetes mellitus with diabetic chronic kidney disease: Secondary | ICD-10-CM | POA: Diagnosis not present

## 2015-11-23 DIAGNOSIS — N401 Enlarged prostate with lower urinary tract symptoms: Secondary | ICD-10-CM | POA: Diagnosis not present

## 2015-11-23 DIAGNOSIS — I129 Hypertensive chronic kidney disease with stage 1 through stage 4 chronic kidney disease, or unspecified chronic kidney disease: Secondary | ICD-10-CM | POA: Diagnosis not present

## 2015-11-26 DIAGNOSIS — R339 Retention of urine, unspecified: Secondary | ICD-10-CM | POA: Diagnosis not present

## 2015-11-26 DIAGNOSIS — E1151 Type 2 diabetes mellitus with diabetic peripheral angiopathy without gangrene: Secondary | ICD-10-CM | POA: Diagnosis not present

## 2015-11-26 DIAGNOSIS — I129 Hypertensive chronic kidney disease with stage 1 through stage 4 chronic kidney disease, or unspecified chronic kidney disease: Secondary | ICD-10-CM | POA: Diagnosis not present

## 2015-11-26 DIAGNOSIS — N401 Enlarged prostate with lower urinary tract symptoms: Secondary | ICD-10-CM | POA: Diagnosis not present

## 2015-11-26 DIAGNOSIS — E1122 Type 2 diabetes mellitus with diabetic chronic kidney disease: Secondary | ICD-10-CM | POA: Diagnosis not present

## 2015-11-26 DIAGNOSIS — N183 Chronic kidney disease, stage 3 (moderate): Secondary | ICD-10-CM | POA: Diagnosis not present

## 2015-11-28 DIAGNOSIS — N401 Enlarged prostate with lower urinary tract symptoms: Secondary | ICD-10-CM | POA: Diagnosis not present

## 2015-11-28 DIAGNOSIS — R339 Retention of urine, unspecified: Secondary | ICD-10-CM | POA: Diagnosis not present

## 2015-11-28 DIAGNOSIS — I129 Hypertensive chronic kidney disease with stage 1 through stage 4 chronic kidney disease, or unspecified chronic kidney disease: Secondary | ICD-10-CM | POA: Diagnosis not present

## 2015-11-28 DIAGNOSIS — E1151 Type 2 diabetes mellitus with diabetic peripheral angiopathy without gangrene: Secondary | ICD-10-CM | POA: Diagnosis not present

## 2015-11-28 DIAGNOSIS — E1122 Type 2 diabetes mellitus with diabetic chronic kidney disease: Secondary | ICD-10-CM | POA: Diagnosis not present

## 2015-11-28 DIAGNOSIS — N183 Chronic kidney disease, stage 3 (moderate): Secondary | ICD-10-CM | POA: Diagnosis not present

## 2015-11-30 DIAGNOSIS — E1151 Type 2 diabetes mellitus with diabetic peripheral angiopathy without gangrene: Secondary | ICD-10-CM | POA: Diagnosis not present

## 2015-11-30 DIAGNOSIS — I129 Hypertensive chronic kidney disease with stage 1 through stage 4 chronic kidney disease, or unspecified chronic kidney disease: Secondary | ICD-10-CM | POA: Diagnosis not present

## 2015-11-30 DIAGNOSIS — N183 Chronic kidney disease, stage 3 (moderate): Secondary | ICD-10-CM | POA: Diagnosis not present

## 2015-11-30 DIAGNOSIS — N401 Enlarged prostate with lower urinary tract symptoms: Secondary | ICD-10-CM | POA: Diagnosis not present

## 2015-11-30 DIAGNOSIS — E1122 Type 2 diabetes mellitus with diabetic chronic kidney disease: Secondary | ICD-10-CM | POA: Diagnosis not present

## 2015-11-30 DIAGNOSIS — R339 Retention of urine, unspecified: Secondary | ICD-10-CM | POA: Diagnosis not present

## 2015-12-26 DIAGNOSIS — I209 Angina pectoris, unspecified: Secondary | ICD-10-CM | POA: Diagnosis not present

## 2015-12-26 DIAGNOSIS — Z950 Presence of cardiac pacemaker: Secondary | ICD-10-CM | POA: Diagnosis not present

## 2015-12-26 DIAGNOSIS — R2689 Other abnormalities of gait and mobility: Secondary | ICD-10-CM | POA: Diagnosis not present

## 2015-12-26 DIAGNOSIS — F919 Conduct disorder, unspecified: Secondary | ICD-10-CM | POA: Diagnosis not present

## 2015-12-26 DIAGNOSIS — R339 Retention of urine, unspecified: Secondary | ICD-10-CM | POA: Diagnosis not present

## 2015-12-26 DIAGNOSIS — E7800 Pure hypercholesterolemia, unspecified: Secondary | ICD-10-CM | POA: Diagnosis not present

## 2015-12-26 DIAGNOSIS — E1151 Type 2 diabetes mellitus with diabetic peripheral angiopathy without gangrene: Secondary | ICD-10-CM | POA: Diagnosis not present

## 2015-12-26 DIAGNOSIS — G319 Degenerative disease of nervous system, unspecified: Secondary | ICD-10-CM | POA: Diagnosis not present

## 2016-01-23 ENCOUNTER — Encounter (HOSPITAL_COMMUNITY): Payer: Self-pay | Admitting: Neurology

## 2016-01-23 ENCOUNTER — Emergency Department (HOSPITAL_COMMUNITY): Payer: Medicare Other

## 2016-01-23 ENCOUNTER — Inpatient Hospital Stay (HOSPITAL_COMMUNITY)
Admission: EM | Admit: 2016-01-23 | Discharge: 2016-01-26 | DRG: 690 | Disposition: A | Discharge status: Home Health | Payer: Medicare Other | Attending: Internal Medicine | Admitting: Internal Medicine

## 2016-01-23 DIAGNOSIS — R630 Anorexia: Secondary | ICD-10-CM | POA: Diagnosis present

## 2016-01-23 DIAGNOSIS — I482 Chronic atrial fibrillation: Secondary | ICD-10-CM

## 2016-01-23 DIAGNOSIS — Z79891 Long term (current) use of opiate analgesic: Secondary | ICD-10-CM

## 2016-01-23 DIAGNOSIS — N3000 Acute cystitis without hematuria: Secondary | ICD-10-CM

## 2016-01-23 DIAGNOSIS — Z950 Presence of cardiac pacemaker: Secondary | ICD-10-CM | POA: Diagnosis not present

## 2016-01-23 DIAGNOSIS — R339 Retention of urine, unspecified: Secondary | ICD-10-CM | POA: Diagnosis present

## 2016-01-23 DIAGNOSIS — R531 Weakness: Secondary | ICD-10-CM | POA: Diagnosis not present

## 2016-01-23 DIAGNOSIS — R627 Adult failure to thrive: Secondary | ICD-10-CM | POA: Diagnosis present

## 2016-01-23 DIAGNOSIS — Z79899 Other long term (current) drug therapy: Secondary | ICD-10-CM

## 2016-01-23 DIAGNOSIS — Z7901 Long term (current) use of anticoagulants: Secondary | ICD-10-CM

## 2016-01-23 DIAGNOSIS — R4182 Altered mental status, unspecified: Secondary | ICD-10-CM

## 2016-01-23 DIAGNOSIS — E1151 Type 2 diabetes mellitus with diabetic peripheral angiopathy without gangrene: Secondary | ICD-10-CM | POA: Diagnosis present

## 2016-01-23 DIAGNOSIS — I4891 Unspecified atrial fibrillation: Secondary | ICD-10-CM | POA: Diagnosis present

## 2016-01-23 DIAGNOSIS — N179 Acute kidney failure, unspecified: Secondary | ICD-10-CM | POA: Diagnosis present

## 2016-01-23 DIAGNOSIS — G2581 Restless legs syndrome: Secondary | ICD-10-CM | POA: Diagnosis present

## 2016-01-23 DIAGNOSIS — R918 Other nonspecific abnormal finding of lung field: Secondary | ICD-10-CM | POA: Diagnosis not present

## 2016-01-23 DIAGNOSIS — I495 Sick sinus syndrome: Secondary | ICD-10-CM | POA: Diagnosis present

## 2016-01-23 DIAGNOSIS — R1031 Right lower quadrant pain: Secondary | ICD-10-CM

## 2016-01-23 DIAGNOSIS — F0280 Dementia in other diseases classified elsewhere without behavioral disturbance: Secondary | ICD-10-CM | POA: Diagnosis present

## 2016-01-23 DIAGNOSIS — E1159 Type 2 diabetes mellitus with other circulatory complications: Secondary | ICD-10-CM | POA: Diagnosis present

## 2016-01-23 DIAGNOSIS — B961 Klebsiella pneumoniae [K. pneumoniae] as the cause of diseases classified elsewhere: Secondary | ICD-10-CM | POA: Diagnosis present

## 2016-01-23 DIAGNOSIS — I10 Essential (primary) hypertension: Secondary | ICD-10-CM | POA: Diagnosis present

## 2016-01-23 DIAGNOSIS — G309 Alzheimer's disease, unspecified: Secondary | ICD-10-CM | POA: Diagnosis present

## 2016-01-23 DIAGNOSIS — N390 Urinary tract infection, site not specified: Principal | ICD-10-CM | POA: Diagnosis present

## 2016-01-23 DIAGNOSIS — R404 Transient alteration of awareness: Secondary | ICD-10-CM | POA: Diagnosis not present

## 2016-01-23 DIAGNOSIS — Z7982 Long term (current) use of aspirin: Secondary | ICD-10-CM

## 2016-01-23 DIAGNOSIS — I2510 Atherosclerotic heart disease of native coronary artery without angina pectoris: Secondary | ICD-10-CM | POA: Diagnosis present

## 2016-01-23 LAB — COMPREHENSIVE METABOLIC PANEL
ALBUMIN: 4 g/dL (ref 3.5–5.0)
ALT: 11 U/L — ABNORMAL LOW (ref 17–63)
ANION GAP: 10 (ref 5–15)
AST: 30 U/L (ref 15–41)
Alkaline Phosphatase: 81 U/L (ref 38–126)
BILIRUBIN TOTAL: 1.4 mg/dL — AB (ref 0.3–1.2)
BUN: 21 mg/dL — AB (ref 6–20)
CHLORIDE: 104 mmol/L (ref 101–111)
CO2: 25 mmol/L (ref 22–32)
Calcium: 9.1 mg/dL (ref 8.9–10.3)
Creatinine, Ser: 1.41 mg/dL — ABNORMAL HIGH (ref 0.61–1.24)
GFR calc Af Amer: 50 mL/min — ABNORMAL LOW (ref >60)
GFR, EST NON AFRICAN AMERICAN: 43 mL/min — AB (ref >60)
Glucose, Bld: 91 mg/dL (ref 65–99)
POTASSIUM: 4.7 mmol/L (ref 3.5–5.1)
Sodium: 139 mmol/L (ref 135–145)
TOTAL PROTEIN: 7.6 g/dL (ref 6.5–8.1)

## 2016-01-23 LAB — URINALYSIS, ROUTINE W REFLEX MICROSCOPIC
BILIRUBIN URINE: NEGATIVE
GLUCOSE, UA: NEGATIVE mg/dL
KETONES UR: NEGATIVE mg/dL
NITRITE: NEGATIVE
PH: 5 (ref 5.0–8.0)
PROTEIN: NEGATIVE mg/dL
Specific Gravity, Urine: 1.005 (ref 1.005–1.030)

## 2016-01-23 LAB — CBC
HEMATOCRIT: 44.7 % (ref 39.0–52.0)
HEMOGLOBIN: 15 g/dL (ref 13.0–17.0)
MCH: 30.5 pg (ref 26.0–34.0)
MCHC: 33.6 g/dL (ref 30.0–36.0)
MCV: 90.9 fL (ref 78.0–100.0)
Platelets: 254 10*3/uL (ref 150–400)
RBC: 4.92 MIL/uL (ref 4.22–5.81)
RDW: 13.9 % (ref 11.5–15.5)
WBC: 11.5 10*3/uL — AB (ref 4.0–10.5)

## 2016-01-23 LAB — GLUCOSE, CAPILLARY: GLUCOSE-CAPILLARY: 142 mg/dL — AB (ref 65–99)

## 2016-01-23 MED ORDER — SERTRALINE HCL 50 MG PO TABS
50.0000 mg | ORAL_TABLET | Freq: Every day | ORAL | Status: DC
  Administered 2016-01-24 – 2016-01-26 (×3): 50 mg via ORAL
  Filled 2016-01-23 (×3): qty 1

## 2016-01-23 MED ORDER — PANTOPRAZOLE SODIUM 40 MG PO TBEC
40.0000 mg | DELAYED_RELEASE_TABLET | Freq: Every day | ORAL | Status: DC
  Administered 2016-01-24 – 2016-01-26 (×3): 40 mg via ORAL
  Filled 2016-01-23 (×2): qty 1
  Filled 2016-01-23: qty 2

## 2016-01-23 MED ORDER — ACETAMINOPHEN 650 MG RE SUPP: 650.0000 mg | Freq: Four times a day (QID) | RECTAL | Status: DC | PRN

## 2016-01-23 MED ORDER — FERROUS SULFATE 325 (65 FE) MG PO TABS
325.0000 mg | ORAL_TABLET | Freq: Two times a day (BID) | ORAL | Status: DC
  Administered 2016-01-23 – 2016-01-26 (×6): 325 mg via ORAL
  Filled 2016-01-23 (×6): qty 1

## 2016-01-23 MED ORDER — ENOXAPARIN SODIUM 40 MG/0.4ML ~~LOC~~ SOLN
40.0000 mg | SUBCUTANEOUS | Status: DC
  Administered 2016-01-23 – 2016-01-25 (×3): 40 mg via SUBCUTANEOUS
  Filled 2016-01-23 (×3): qty 0.4

## 2016-01-23 MED ORDER — INSULIN ASPART 100 UNIT/ML ~~LOC~~ SOLN
0.0000 [IU] | Freq: Three times a day (TID) | SUBCUTANEOUS | Status: DC
  Administered 2016-01-24: 1 [IU] via SUBCUTANEOUS
  Administered 2016-01-24: 2 [IU] via SUBCUTANEOUS
  Administered 2016-01-25 (×2): 1 [IU] via SUBCUTANEOUS
  Administered 2016-01-25: 3 [IU] via SUBCUTANEOUS
  Administered 2016-01-26: 1 [IU] via SUBCUTANEOUS

## 2016-01-23 MED ORDER — MEMANTINE HCL 10 MG PO TABS
10.0000 mg | ORAL_TABLET | Freq: Two times a day (BID) | ORAL | Status: DC
  Administered 2016-01-23 – 2016-01-26 (×6): 10 mg via ORAL
  Filled 2016-01-23 (×7): qty 1

## 2016-01-23 MED ORDER — ASPIRIN EC 81 MG PO TBEC
81.0000 mg | DELAYED_RELEASE_TABLET | Freq: Every day | ORAL | Status: DC
  Administered 2016-01-24 – 2016-01-26 (×3): 81 mg via ORAL
  Filled 2016-01-23 (×3): qty 1

## 2016-01-23 MED ORDER — HYDROCODONE-ACETAMINOPHEN 5-325 MG PO TABS
1.0000 | ORAL_TABLET | Freq: Two times a day (BID) | ORAL | Status: DC
  Administered 2016-01-23 – 2016-01-26 (×6): 1 via ORAL
  Filled 2016-01-23 (×6): qty 1

## 2016-01-23 MED ORDER — DEXTROSE 5 % IV SOLN
1.0000 g | Freq: Once | INTRAVENOUS | Status: AC
  Administered 2016-01-23: 1 g via INTRAVENOUS
  Filled 2016-01-23: qty 10

## 2016-01-23 MED ORDER — ATORVASTATIN CALCIUM 10 MG PO TABS
20.0000 mg | ORAL_TABLET | Freq: Every evening | ORAL | Status: DC
  Administered 2016-01-24 – 2016-01-25 (×2): 20 mg via ORAL
  Filled 2016-01-23 (×2): qty 2

## 2016-01-23 MED ORDER — ACETAMINOPHEN 325 MG PO TABS: 650.0000 mg | ORAL_TABLET | Freq: Four times a day (QID) | ORAL | Status: DC | PRN

## 2016-01-23 MED ORDER — SODIUM CHLORIDE 0.9 % IV BOLUS (SEPSIS): 1000.0000 mL | Freq: Once | INTRAVENOUS | Status: DC

## 2016-01-23 MED ORDER — ALPRAZOLAM 0.25 MG PO TABS
0.2500 mg | ORAL_TABLET | Freq: Two times a day (BID) | ORAL | Status: DC | PRN
  Administered 2016-01-23: 0.25 mg via ORAL
  Filled 2016-01-23: qty 1

## 2016-01-23 MED ORDER — SODIUM CHLORIDE 0.9 % IV SOLN
INTRAVENOUS | Status: DC
  Administered 2016-01-24 – 2016-01-25 (×2): via INTRAVENOUS

## 2016-01-23 MED ORDER — SODIUM CHLORIDE 0.9 % IV BOLUS (SEPSIS)
1000.0000 mL | Freq: Once | INTRAVENOUS | Status: AC
  Administered 2016-01-23: 1000 mL via INTRAVENOUS

## 2016-01-23 MED ORDER — DONEPEZIL HCL 23 MG PO TABS
23.0000 mg | ORAL_TABLET | Freq: Every day | ORAL | Status: DC
  Administered 2016-01-23 – 2016-01-25 (×3): 23 mg via ORAL
  Filled 2016-01-23 (×3): qty 1

## 2016-01-23 MED ORDER — DEXTROSE 5 % IV SOLN
1.0000 g | INTRAVENOUS | Status: DC
  Administered 2016-01-24 – 2016-01-25 (×2): 1 g via INTRAVENOUS
  Filled 2016-01-23 (×3): qty 10

## 2016-01-23 MED ORDER — IOPAMIDOL (ISOVUE-300) INJECTION 61%
INTRAVENOUS | Status: AC
  Filled 2016-01-23: qty 30

## 2016-01-23 MED ORDER — AMLODIPINE BESYLATE 5 MG PO TABS
5.0000 mg | ORAL_TABLET | Freq: Every day | ORAL | Status: DC
  Administered 2016-01-25 – 2016-01-26 (×2): 5 mg via ORAL
  Filled 2016-01-23 (×3): qty 1

## 2016-01-23 MED ORDER — ISOSORBIDE MONONITRATE ER 30 MG PO TB24
30.0000 mg | ORAL_TABLET | Freq: Every day | ORAL | Status: DC
  Administered 2016-01-24 – 2016-01-26 (×3): 30 mg via ORAL
  Filled 2016-01-23 (×3): qty 1

## 2016-01-23 MED ORDER — PRAMIPEXOLE DIHYDROCHLORIDE 0.25 MG PO TABS
0.2500 mg | ORAL_TABLET | Freq: Every evening | ORAL | Status: DC
  Administered 2016-01-24 – 2016-01-25 (×3): 0.25 mg via ORAL
  Filled 2016-01-23 (×4): qty 1

## 2016-01-23 MED ORDER — METOPROLOL SUCCINATE ER 25 MG PO TB24
12.5000 mg | ORAL_TABLET | Freq: Every day | ORAL | Status: DC
  Administered 2016-01-24 – 2016-01-26 (×3): 12.5 mg via ORAL
  Filled 2016-01-23 (×3): qty 1

## 2016-01-23 NOTE — ED Triage Notes (Signed)
Per ems- pt is coming from [facility] assisted living reporting generalized weakness with hx of alzheimer's. Negative stroke screen. CBG 129, BP 158/96, HR 77 paced. Pt denies any pain, is confused.

## 2016-01-23 NOTE — ED Notes (Signed)
Called floor to make aware that pt has to drink oral contrast and will then get CT. Reports he can come to the floor at [facility].

## 2016-01-23 NOTE — ED Notes (Signed)
Called patient's caregiver Juliann Pulse, given up date per patient request. Pt spoke with her on the phone.

## 2016-01-23 NOTE — H&P (Addendum)
History and Physical    Jon Davis [id number] DOB: November 22, 1926 DOA: 01/23/2016    PCP: Haywood Pao, MD  Patient coming from: independent living facility  Chief Complaint: poor oral intake and weakness  HPI: Jon Davis is a 82 y.o. male with medical history significant of A-fib, tachy brady syndrome with pacemaker, Alzheimer's dementia, HTN, DM who is sent by his wife from independent living for weakness. He has a h/o alzheimer's dementia and is unable to give me a history. Dr Jon Davis has spoken with the patient's wife who states that more worrisome than the weakness is the fact that he has not been eating for 2-3 days.   ED Course:  UA +, WBC 11, afebrile and all vitals stable Cr mildly elevated from baseline of 1.18 at 1.41- BUN 21  Review of Systems:  He is unable to give a ROS due to dementia    Past Medical History:  Diagnosis Date  . Anemia 06/2012  . Atrial fibrillation ([place])   . BPH (benign prostatic hyperplasia)    Followed by Dr. Matilde Davis  . Brady-tachy syndrome ([place])    04/17/2009 MDT pacer Enrhythmatr fib  . CAD (coronary artery disease)   . Dementia in Alzheimer's disease   . Depression   . Diabetes ([place])   . Diverticulosis 02/26/2010   Sigmoid colon  . Duodenitis 02/26/2010  . Elevated PSA   . Episodic VTach, pacer    Followed by Dr. Avon Davis  . Fall 03/2014   elbow fluid   . Gastritis 02/26/2010   moderate  . Hiatal hernia 02/26/2010  . Hyperlipidemia   . Hypertension   . Memory loss   . Microalbuminuria   . Urinary incontinence     Past Surgical History:  Procedure Laterality Date  . APPENDECTOMY  1948  . CARDIAC CATHETERIZATION  07/07/2007   mild to mod. ca+ w/narrowing of 20% ostium of second diagonal,diffuse 80% stenosis small caliber infer. branch,20% mid LAD,60-70% narrowing mid atrioventricular groove CX,mild 20-30% RCA  . CAROTID DOPPLER  10/01/2006   0-49% right bulb,prox,ECA & prox ICA,0-49% left subclavian bulb & prox  ICA,right & left ICA abn. resistance  . Cataracts    . CHOLECYSTECTOMY    . CIRCUMCISION N/A 10/28/2015   Procedure: CIRCUMCISION ADULT, DORSAL SLIT;  Surgeon: Jon Hughs, MD;  Location: [facility];  Service: Urology;  Laterality: N/A;  . NM MYOVIEW LTD  10/01/2010   No ischemia  . PERMANENT PACEMAKER INSERTION  04/17/2009   MDT Enrhythm  . TRANSURETHRAL RESECTION OF PROSTATE N/A 10/28/2015   Procedure: TRANSURETHRAL RESECTION OF THE PROSTATE (TURP);  Surgeon: Jon Hughs, MD;  Location: [facility];  Service: Urology;  Laterality: N/A;    Social History:   reports that he has quit smoking. He quit smokeless tobacco use about 41 years ago. He reports that he does not drink alcohol or use drugs.  Allergies  Allergen Reactions  . Penicillins Rash    Has patient had a PCN reaction causing immediate rash, facial/tongue/throat swelling, SOB or lightheadedness with hypotension: No  Has patient had a PCN reaction causing severe rash involving mucus membranes or skin necrosis: unknown Has patient had a PCN reaction that required hospitalization: No  Has patient had a PCN reaction occurring within the last 10 years: No  If all of the above answers are "NO", then may proceed with Cephalosporin use.    Family History  Problem Relation Age of Onset  . Alzheimer's disease Mother  Deceased  . Heart disease Father     Deceased     Prior to Admission medications   Medication Sig Start Date End Date Taking? Authorizing Provider  ALPRAZolam (XANAX) 0.25 MG tablet Take 0.25 mg by mouth 2 (two) times daily as needed for anxiety.    Yes Historical Provider, MD  amLODipine (NORVASC) 5 MG tablet Take 5 mg by mouth daily.    Yes Historical Provider, MD  aspirin 81 MG tablet Take 81 mg by mouth daily.   Yes Historical Provider, MD  atorvastatin (LIPITOR) 20 MG tablet Take 20 mg by mouth every evening.    Yes Historical Provider, MD  benzonatate (TESSALON) 100 MG capsule Take 100 mg by mouth 2  (two) times daily as needed for cough.   Yes Historical Provider, MD  donepezil (ARICEPT) 23 MG TABS tablet Take 1 tablet (23 mg total) by mouth at bedtime. 02/21/15  Yes Jon Bible, NP  ferrous sulfate 325 (65 FE) MG tablet Take 325 mg by mouth 2 (two) times daily.   Yes Historical Provider, MD  glipiZIDE (GLUCOTROL) 10 MG tablet Take 10 mg by mouth daily. 04/21/14  Yes Historical Provider, MD  HYDROcodone-acetaminophen (NORCO/VICODIN) 5-325 MG per tablet Take 1 tablet by mouth 2 (two) times daily.    Yes Historical Provider, MD  isosorbide mononitrate (IMDUR) 30 MG 24 hr tablet Take 30 mg by mouth daily.   Yes Historical Provider, MD  memantine (NAMENDA) 10 MG tablet Take 10 mg by mouth 2 (two) times daily.   Yes Historical Provider, MD  metoprolol succinate (TOPROL-XL) 25 MG 24 hr tablet Take 0.5 tablets (12.5 mg total) by mouth daily. Take with or immediately following a meal. 11/13/15  Yes Jon Croitoru, MD  pantoprazole (PROTONIX) 40 MG tablet Take 40 mg by mouth daily.   Yes Historical Provider, MD  pramipexole (MIRAPEX) 0.125 MG tablet Take 2 tablets (0.25 mg total) by mouth every evening. Two hours before bedtime 06/29/14  Yes Jon Bible, NP  sertraline (ZOLOFT) 50 MG tablet Take 50 mg by mouth daily.   Yes Historical Provider, MD    Physical Exam: Vitals:   01/23/16 1500 01/23/16 1600 01/23/16 1630 01/23/16 1741  BP: 125/74 138/70 111/73 135/67  Pulse: 62 62 63 (!) 59  Resp: 23 19 14 16   Temp:      TempSrc:      SpO2: 98% 96% 97% 97%      Constitutional: NAD, calm, comfortable Eyes: PERTLA, lids and conjunctivae normal ENMT: Mucous membranes are moist. Posterior pharynx clear of any exudate or lesions. Normal dentition.  Neck: normal, supple, no masses, no thyromegaly Respiratory: clear to auscultation bilaterally, no wheezing, no crackles. Normal respiratory effort. No accessory muscle use.  Cardiovascular: S1 & S2 heard, regular rate and rhythm, no murmurs  / rubs / gallops. No extremity edema. 2+ pedal pulses. No carotid bruits.  Abdomen: No distension,  Tenderness in RLQ and Right flank, no masses palpated. No hepatosplenomegaly. Bowel sounds normal.  Musculoskeletal: no clubbing / cyanosis. No joint deformity upper and lower extremities. Good ROM, no contractures. Normal muscle tone.  Skin: no rashes, lesions, ulcers. No induration Neurologic: CN 2-12 grossly intact. Sensation intact, DTR normal. Strength 5/5 in all 4 limbs.  Psychiatric:   confused to place, time, situation,     Labs on Admission: I have personally reviewed following labs and imaging studies  CBC:  Recent Labs Lab 01/23/16 1340  WBC 11.5*  HGB 15.0  HCT 44.7  MCV 90.9  PLT 0000000   Basic Metabolic Panel:  Recent Labs Lab 01/23/16 1340  NA 139  K 4.7  CL 104  CO2 25  GLUCOSE 91  BUN 21*  CREATININE 1.41*  CALCIUM 9.1   GFR: CrCl cannot be calculated (Unknown ideal weight.). Liver Function Tests:  Recent Labs Lab 01/23/16 1340  AST 30  ALT 11*  ALKPHOS 81  BILITOT 1.4*  PROT 7.6  ALBUMIN 4.0   No results for input(s): LIPASE, AMYLASE in the last 168 hours. No results for input(s): AMMONIA in the last 168 hours. Coagulation Profile: No results for input(s): INR, PROTIME in the last 168 hours. Cardiac Enzymes: No results for input(s): CKTOTAL, CKMB, CKMBINDEX, TROPONINI in the last 168 hours. BNP (last 3 results) No results for input(s): PROBNP in the last 8760 hours. HbA1C: No results for input(s): HGBA1C in the last 72 hours. CBG: No results for input(s): GLUCAP in the last 168 hours. Lipid Profile: No results for input(s): CHOL, HDL, LDLCALC, TRIG, CHOLHDL, LDLDIRECT in the last 72 hours. Thyroid Function Tests: No results for input(s): TSH, T4TOTAL, FREET4, T3FREE, THYROIDAB in the last 72 hours. Anemia Panel: No results for input(s): VITAMINB12, FOLATE, FERRITIN, TIBC, IRON, RETICCTPCT in the last 72 hours. Urine analysis:      Component Value Date/Time   COLORURINE STRAW (A) 01/23/2016 1520   APPEARANCEUR CLOUDY (A) 01/23/2016 1520   LABSPEC 1.005 01/23/2016 1520   PHURINE 5.0 01/23/2016 1520   GLUCOSEU NEGATIVE 01/23/2016 1520   HGBUR SMALL (A) 01/23/2016 1520   BILIRUBINUR NEGATIVE 01/23/2016 1520   BILIRUBINUR small 08/19/2012 1241   KETONESUR NEGATIVE 01/23/2016 1520   PROTEINUR NEGATIVE 01/23/2016 1520   UROBILINOGEN 0.2 08/19/2012 1241   UROBILINOGEN 0.2 03/20/2009 0645   NITRITE NEGATIVE 01/23/2016 1520   LEUKOCYTESUR LARGE (A) 01/23/2016 1520   Sepsis Labs: @LABRCNTIP (procalcitonin:4,lacticidven:4) )No results found for this or any previous visit (from the past 240 hour(s)).   Radiological Exams on Admission: Dg Chest Portable 1 View  Result Date: 01/23/2016 CLINICAL DATA:  Adenoma status EXAM: PORTABLE CHEST 1 VIEW COMPARISON:  October 27, 2015 FINDINGS: There is a pacemaker present with lead tips attached to the right atrium and right ventricle. No pneumothorax. No edema or consolidation. Heart is upper normal in size with pulmonary vascularity within normal limits. There is atherosclerotic calcification in the aorta. No adenopathy. No bone lesions. IMPRESSION: No edema or consolidation. Stable cardiac silhouette. Aortic atherosclerosis. Electronically Signed   By: Lowella Grip III M.D.   On: 01/23/2016 14:27    EKG: Independently reviewed. LBBB, QTc 534  Assessment/Plan Principal Problem:   Loss of appetite, increasing confusion - he has a UTI which may be the cause  - right abdomen and flank quite tender on exam and no appetitie- will check CT scan to assess U tract and also possible diverticulitis - has been started on Rocephin - history states he has U retention but not on alpha blocker- will get post void residual  Active Problems:  Mild AKI - slow IVF- may be due to poor oral intake    Type 2 diabetes mellitus with vascular disease  - apparently appetite has been poor  therefore, hold Glipizide and start low dose ISS    Atrial fibrillation  - permanent - CHA2DS2-VASc Score- 5 -follows with [facility] - cont ASA 81 mg (falll per cardiology) and Metoprolol which was decreased on 10/17 by Dr Sallyanne Kuster  HTN  - Norvasc, Imdur and Metoprolol with holding parameters  CAD -  Imdur, Metoprolol, ASA  Pacemaker for tachy/brady syndrome  Alzheimer's dementia - cont Aricept and Namenda      DVT prophylaxis: Lovenox Code Status: Full code  Family Communication:   Disposition Plan: return to independent living when stable  Consults called:   Admission status: observation    [facility] MD [facility] Pager: [email or web address] Password [email or web address] 7PM-7AM, please contact night-coverage   01/23/2016, 6:15 PM

## 2016-01-23 NOTE — ED Notes (Signed)
Please contact caregiver if needed: Samule Dry [phone]  Add to facesheet also thanks

## 2016-01-23 NOTE — ED Provider Notes (Signed)
[facility] DEPT Provider Note   CSN: [id number] Arrival date & time: 01/23/16  1318     History   Chief Complaint Chief Complaint  Patient presents with  . Altered Mental Status    Level V caveat: Altered mental status  HPI SCIPIO PRESSNALL is a 82 y.o. male.  HPI  Patient has a history of dementia and was sent to the emergency department for increasing confusion as well as decreased oral intake over the past 24 hours.  I spoke with his wife who he lives with at an independent living facility who states that he has had increasing generalized weakness over the past 24 hours.  No reported fevers.  No reported vomiting.  He does have dementia at baseline but wife reports some mild increased confusion.  She is most concerned about the fact that he is not eating or drinking.  Past Medical History:  Diagnosis Date  . Anemia 06/2012  . Atrial fibrillation ([place])   . BPH (benign prostatic hyperplasia)    Followed by Dr. Matilde Sprang  . Brady-tachy syndrome ([place])    04/17/2009 MDT pacer Enrhythmatr fib  . CAD (coronary artery disease)   . Dementia in Alzheimer's disease   . Depression   . Diabetes ([place])   . Diverticulosis 02/26/2010   Sigmoid colon  . Duodenitis 02/26/2010  . Elevated PSA   . Episodic VTach, pacer    Followed by Dr. Avon Gully  . Fall 03/2014   elbow fluid   . Gastritis 02/26/2010   moderate  . Hiatal hernia 02/26/2010  . Hyperlipidemia   . Hypertension   . Memory loss   . Microalbuminuria   . Urinary incontinence     Patient Active Problem List   Diagnosis Date Noted  . Refusal of blood transfusions as patient is Jehovah's Witness 10/28/2015  . Hypokalemia 10/27/2015  . UTI (urinary tract infection) 10/21/2015  . Urinary retention 10/21/2015  . Sigmoid diverticulitis 10/17/2015  . Dementia 10/17/2015  . ARF (acute renal failure) ([place]) 10/17/2015  . Diarrhea 10/17/2015  . AKI (acute kidney injury) ([place])   . Cerebral degeneration, unspecified  05/26/2013  . Restless legs syndrome (RLS) 05/26/2013  . CAD (coronary artery disease) 07/31/2012  . Pacemaker 07/31/2012  . Nonsustained ventricular tachycardia ([place]) 07/31/2012  . Aortic atherosclerosis ([place]) 07/31/2012  . Microcytic anemia 06/29/2012  . Long term (current) use of anticoagulants 04/26/2012  . THYROID NODULE 01/23/2010  . Type 2 diabetes mellitus with vascular disease ([place]) 01/23/2010  . Hyperlipidemia 01/23/2010  . ANEMIA, IRON DEFICIENCY 01/23/2010  . Memory loss 01/23/2010  . Essential hypertension 01/23/2010  . Atrial fibrillation ([place]) 01/23/2010  . CVA 01/23/2010  . ESOPHAGEAL STRICTURE 01/23/2010  . HIATAL HERNIA 01/23/2010  . DIVERTICULOSIS, COLON 01/23/2010  . HEMOCCULT POSITIVE STOOL 01/23/2010    Past Surgical History:  Procedure Laterality Date  . APPENDECTOMY  1948  . CARDIAC CATHETERIZATION  07/07/2007   mild to mod. ca+ w/narrowing of 20% ostium of second diagonal,diffuse 80% stenosis small caliber infer. branch,20% mid LAD,60-70% narrowing mid atrioventricular groove CX,mild 20-30% RCA  . CAROTID DOPPLER  10/01/2006   0-49% right bulb,prox,ECA & prox ICA,0-49% left subclavian bulb & prox ICA,right & left ICA abn. resistance  . Cataracts    . CHOLECYSTECTOMY    . CIRCUMCISION N/A 10/28/2015   Procedure: CIRCUMCISION ADULT, DORSAL SLIT;  Surgeon: Ardis Hughs, MD;  Location: [facility];  Service: Urology;  Laterality: N/A;  . NM MYOVIEW LTD  10/01/2010   No ischemia  .  PERMANENT PACEMAKER INSERTION  04/17/2009   MDT Enrhythm  . TRANSURETHRAL RESECTION OF PROSTATE N/A 10/28/2015   Procedure: TRANSURETHRAL RESECTION OF THE PROSTATE (TURP);  Surgeon: Ardis Hughs, MD;  Location: [facility];  Service: Urology;  Laterality: N/A;       Home Medications    Prior to Admission medications   Medication Sig Start Date End Date Taking? Authorizing Provider  ALPRAZolam (XANAX) 0.25 MG tablet Take 0.25 mg by mouth 2 (two) times daily.     Historical  Provider, MD  amLODipine (NORVASC) 5 MG tablet Take 2.5 mg by mouth daily.     Historical Provider, MD  aspirin 81 MG tablet Take 81 mg by mouth daily.    Historical Provider, MD  atorvastatin (LIPITOR) 20 MG tablet Take 20 mg by mouth every evening.     Historical Provider, MD  bacitracin ointment Apply topically 3 (three) times daily. 10/31/15   Theodis Blaze, MD  cefUROXime (CEFTIN) 500 MG tablet Take 1 tablet (500 mg total) by mouth 2 (two) times daily with a meal. 10/21/15   Debbe Odea, MD  donepezil (ARICEPT) 23 MG TABS tablet Take 1 tablet (23 mg total) by mouth at bedtime. 02/21/15   Dennie Bible, NP  ferrous sulfate 325 (65 FE) MG tablet Take 325 mg by mouth 2 (two) times daily.    Historical Provider, MD  glipiZIDE (GLUCOTROL) 10 MG tablet Take 10 mg by mouth daily. 04/21/14   Historical Provider, MD  HYDROcodone-acetaminophen (NORCO/VICODIN) 5-325 MG per tablet Take 1 tablet by mouth every 6 (six) hours as needed for moderate pain or severe pain.     Historical Provider, MD  isosorbide mononitrate (IMDUR) 30 MG 24 hr tablet Take 30 mg by mouth daily.    Historical Provider, MD  memantine (NAMENDA) 10 MG tablet Take 10 mg by mouth 2 (two) times daily.    Historical Provider, MD  metoprolol succinate (TOPROL-XL) 25 MG 24 hr tablet Take 0.5 tablets (12.5 mg total) by mouth daily. Take with or immediately following a meal. 11/13/15   Mihai Croitoru, MD  ondansetron (ZOFRAN-ODT) 8 MG disintegrating tablet DIS 1/2 T PO Q 8 H PRF NAUSEA 10/20/15   Historical Provider, MD  pantoprazole (PROTONIX) 40 MG tablet Take 40 mg by mouth daily.    Historical Provider, MD  pramipexole (MIRAPEX) 0.125 MG tablet Take 2 tablets (0.25 mg total) by mouth every evening. Two hours before bedtime 06/29/14   Dennie Bible, NP  sertraline (ZOLOFT) 50 MG tablet Take 50 mg by mouth daily.    Historical Provider, MD  SSD 1 % cream Apply 1 application topically daily. APPLY 1 APPLICATION TO AFFECTED AREA ONCE  DAILY FOR 14 DAYS 10/24/15   Historical Provider, MD  tamsulosin (FLOMAX) 0.4 MG CAPS capsule Take 1 capsule (0.4 mg total) by mouth daily. 10/21/15   Debbe Odea, MD    Family History Family History  Problem Relation Age of Onset  . Alzheimer's disease Mother     Deceased  . Heart disease Father     Deceased    Social History Social History  Substance Use Topics  . Smoking status: Former Research scientist (life sciences)  . Smokeless tobacco: Former Systems developer    Quit date: 02/09/1974  . Alcohol use No     Allergies   Penicillins   Review of Systems Review of Systems  Unable to perform ROS: Dementia     Physical Exam Updated Vital Signs BP 130/71 (BP Location: Right Arm)  Pulse 61   Temp 98.5 F (36.9 C) (Oral)   Resp 21   SpO2 97%   Physical Exam  Constitutional: He appears well-developed and well-nourished.  HENT:  Head: Normocephalic and atraumatic.  Dry mucous membranes  Eyes: EOM are normal.  Neck: Normal range of motion.  Cardiovascular: Normal rate, regular rhythm and normal heart sounds.   Pulmonary/Chest: Effort normal and breath sounds normal. No respiratory distress.  Abdominal: Soft. He exhibits no distension. There is no tenderness.  Musculoskeletal: Normal range of motion.  Neurological: He is alert.  Answers simple questions and follows simple commands.  Skin: Skin is warm and dry.  Psychiatric: He has a normal mood and affect. Judgment normal.  Nursing note and vitals reviewed.    ED Treatments / Results  Labs (all labs ordered are listed, but only abnormal results are displayed) Labs Reviewed  COMPREHENSIVE METABOLIC PANEL - Abnormal; Notable for the following:       Result Value   BUN 21 (*)    Creatinine, Ser 1.41 (*)    ALT 11 (*)    Total Bilirubin 1.4 (*)    GFR calc non Af Amer 43 (*)    GFR calc Af Amer 50 (*)    All other components within normal limits  CBC - Abnormal; Notable for the following:    WBC 11.5 (*)    All other components within  normal limits  URINALYSIS, ROUTINE W REFLEX MICROSCOPIC - Abnormal; Notable for the following:    Color, Urine STRAW (*)    APPearance CLOUDY (*)    Hgb urine dipstick SMALL (*)    Leukocytes, UA LARGE (*)    Bacteria, UA MANY (*)    Squamous Epithelial / LPF 0-5 (*)    All other components within normal limits  CBG MONITORING, ED    EKG  EKG Interpretation None       Radiology No results found.  Procedures Procedures (including critical care time)  Medications Ordered in ED Medications - No data to display   Initial Impression / Assessment and Plan / ED Course  I have reviewed the triage vital signs and the nursing notes.  Pertinent labs & imaging results that were available during my care of the patient were reviewed by me and considered in my medical decision making (see chart for details).  Clinical Course     Decreased oral intake.  Mild increase in his BUN and creatinine likely secondary to dehydration.  IV fluids now.  Found to have a urinary tract infection.  Urine culture added on.  IV Rocephin now.  Observational admission.  Final Clinical Impressions(s) / ED Diagnoses   Final diagnoses:  None    New Prescriptions New Prescriptions   No medications on file     Jola Schmidt, MD 01/23/16 1610

## 2016-01-24 ENCOUNTER — Observation Stay (HOSPITAL_COMMUNITY): Payer: Medicare Other

## 2016-01-24 ENCOUNTER — Encounter (HOSPITAL_COMMUNITY): Payer: Self-pay | Admitting: Radiology

## 2016-01-24 DIAGNOSIS — R630 Anorexia: Secondary | ICD-10-CM | POA: Diagnosis present

## 2016-01-24 DIAGNOSIS — Z7982 Long term (current) use of aspirin: Secondary | ICD-10-CM | POA: Diagnosis not present

## 2016-01-24 DIAGNOSIS — E1151 Type 2 diabetes mellitus with diabetic peripheral angiopathy without gangrene: Secondary | ICD-10-CM | POA: Diagnosis present

## 2016-01-24 DIAGNOSIS — G309 Alzheimer's disease, unspecified: Secondary | ICD-10-CM | POA: Diagnosis present

## 2016-01-24 DIAGNOSIS — E1159 Type 2 diabetes mellitus with other circulatory complications: Secondary | ICD-10-CM

## 2016-01-24 DIAGNOSIS — F0280 Dementia in other diseases classified elsewhere without behavioral disturbance: Secondary | ICD-10-CM | POA: Diagnosis present

## 2016-01-24 DIAGNOSIS — Z79899 Other long term (current) drug therapy: Secondary | ICD-10-CM | POA: Diagnosis not present

## 2016-01-24 DIAGNOSIS — I10 Essential (primary) hypertension: Secondary | ICD-10-CM | POA: Diagnosis present

## 2016-01-24 DIAGNOSIS — Z79891 Long term (current) use of opiate analgesic: Secondary | ICD-10-CM | POA: Diagnosis not present

## 2016-01-24 DIAGNOSIS — N390 Urinary tract infection, site not specified: Secondary | ICD-10-CM | POA: Diagnosis not present

## 2016-01-24 DIAGNOSIS — I495 Sick sinus syndrome: Secondary | ICD-10-CM | POA: Diagnosis present

## 2016-01-24 DIAGNOSIS — R1030 Lower abdominal pain, unspecified: Secondary | ICD-10-CM | POA: Diagnosis not present

## 2016-01-24 DIAGNOSIS — I2510 Atherosclerotic heart disease of native coronary artery without angina pectoris: Secondary | ICD-10-CM | POA: Diagnosis present

## 2016-01-24 DIAGNOSIS — Z7901 Long term (current) use of anticoagulants: Secondary | ICD-10-CM | POA: Diagnosis not present

## 2016-01-24 DIAGNOSIS — N179 Acute kidney failure, unspecified: Secondary | ICD-10-CM

## 2016-01-24 DIAGNOSIS — G2581 Restless legs syndrome: Secondary | ICD-10-CM | POA: Diagnosis present

## 2016-01-24 DIAGNOSIS — I482 Chronic atrial fibrillation: Secondary | ICD-10-CM | POA: Diagnosis present

## 2016-01-24 DIAGNOSIS — B961 Klebsiella pneumoniae [K. pneumoniae] as the cause of diseases classified elsewhere: Secondary | ICD-10-CM | POA: Diagnosis present

## 2016-01-24 DIAGNOSIS — R627 Adult failure to thrive: Secondary | ICD-10-CM | POA: Diagnosis present

## 2016-01-24 LAB — BASIC METABOLIC PANEL
Anion gap: 14 (ref 5–15)
BUN: 22 mg/dL — AB (ref 6–20)
CHLORIDE: 99 mmol/L — AB (ref 101–111)
CO2: 24 mmol/L (ref 22–32)
Calcium: 8.9 mg/dL (ref 8.9–10.3)
Creatinine, Ser: 1.56 mg/dL — ABNORMAL HIGH (ref 0.61–1.24)
GFR, EST AFRICAN AMERICAN: 44 mL/min — AB (ref >60)
GFR, EST NON AFRICAN AMERICAN: 38 mL/min — AB (ref >60)
Glucose, Bld: 129 mg/dL — ABNORMAL HIGH (ref 65–99)
POTASSIUM: 3.6 mmol/L (ref 3.5–5.1)
SODIUM: 137 mmol/L (ref 135–145)

## 2016-01-24 LAB — MRSA PCR SCREENING: MRSA by PCR: POSITIVE — AB

## 2016-01-24 LAB — CBC
HEMATOCRIT: 43.9 % (ref 39.0–52.0)
Hemoglobin: 15 g/dL (ref 13.0–17.0)
MCH: 30.7 pg (ref 26.0–34.0)
MCHC: 34.2 g/dL (ref 30.0–36.0)
MCV: 90 fL (ref 78.0–100.0)
PLATELETS: 250 10*3/uL (ref 150–400)
RBC: 4.88 MIL/uL (ref 4.22–5.81)
RDW: 13.5 % (ref 11.5–15.5)
WBC: 12.2 10*3/uL — AB (ref 4.0–10.5)

## 2016-01-24 LAB — GLUCOSE, CAPILLARY
GLUCOSE-CAPILLARY: 110 mg/dL — AB (ref 65–99)
GLUCOSE-CAPILLARY: 157 mg/dL — AB (ref 65–99)
Glucose-Capillary: 134 mg/dL — ABNORMAL HIGH (ref 65–99)
Glucose-Capillary: 235 mg/dL — ABNORMAL HIGH (ref 65–99)

## 2016-01-24 MED ORDER — POLYETHYLENE GLYCOL 3350 17 G PO PACK
17.0000 g | PACK | Freq: Every day | ORAL | Status: DC
  Administered 2016-01-24 – 2016-01-26 (×3): 17 g via ORAL
  Filled 2016-01-24 (×3): qty 1

## 2016-01-24 MED ORDER — IOPAMIDOL (ISOVUE-300) INJECTION 61%
INTRAVENOUS | Status: AC
  Administered 2016-01-24: 75 mL
  Filled 2016-01-24: qty 75

## 2016-01-24 MED ORDER — CHLORHEXIDINE GLUCONATE CLOTH 2 % EX PADS
6.0000 | MEDICATED_PAD | Freq: Every day | CUTANEOUS | Status: DC
  Administered 2016-01-25 – 2016-01-26 (×2): 6 via TOPICAL

## 2016-01-24 MED ORDER — MUPIROCIN 2 % EX OINT
1.0000 "application " | TOPICAL_OINTMENT | Freq: Two times a day (BID) | CUTANEOUS | Status: DC
  Administered 2016-01-24 – 2016-01-26 (×4): 1 via NASAL
  Filled 2016-01-24: qty 22

## 2016-01-24 NOTE — Progress Notes (Signed)
PROGRESS NOTE    Jon Davis  [id number] DOB: 18-Jun-1926 DOA: 01/23/2016 PCP: Haywood Pao, MD  Brief Narrative: Jon Davis is a 82 y.o. male with medical history significant of A-fib, tachy brady syndrome with pacemaker, Alzheimer's dementia, HTN, DM who is sent by his wife from independent living for weakness and decreased Po intake for 2-3 days.  UA suggestive of UTI, CT abd pelvis unremarkable  Assessment & Plan:   UTI -continue IV ceftriaxone -FU urine cx -CT abd unremarkable -clinically improving  Failure to thrive -due to above, improving  Mild AKI - cut down IVF, baseline 1.1 -continue IVF another day  Alzheimer's dementia - cont Aricept and Namenda -stable this am     Type 2 diabetes mellitus with vascular disease  -hold Glipizide , SSI    Atrial fibrillation  - permanent - CHA2DS2-VASc Score- 5 -follows with [facility] - cont ASA 81 mg and Metoprolol  -not on anticoagulation per cards  HTN  -stable, cotninue Norvasc, Imdur and Metoprolol  CAD - Imdur, Metoprolol, ASA  Pacemaker for tachy/brady syndrome  DVT prophylaxis: Lovenox Code Status: Full code  Family Communication:  called and updated wife [name] Disposition Plan: return to independent living tomorrow      Antimicrobials: Ceftriaxone 12/13   Subjective: Feels better, no complaints  Objective: Vitals:   01/23/16 1800 01/23/16 1947 01/24/16 0521 01/24/16 1139  BP: 147/67 (!) 132/44 (!) 128/56 (!) 118/59  Pulse: 69 61 65 61  Resp:  19 20 18   Temp:  98.4 F (36.9 C) 98.1 F (36.7 C) 98 F (36.7 C)  TempSrc:  Oral Oral Oral  SpO2: 96% 98% 99% 99%  Weight:  86.7 kg (191 lb 2.2 oz)    Height:  6' (1.829 m)      Intake/Output Summary (Last 24 hours) at 01/24/16 1205 Last data filed at 01/24/16 0830  Gross per 24 hour  Intake              320 ml  Output              325 ml  Net               -5 ml   Filed Weights   01/23/16 1947  Weight: 86.7 kg (191 lb  2.2 oz)    Examination:  General exam: Appears calm and comfortable, AAOx2 Respiratory system: Clear to auscultation. Respiratory effort normal. Cardiovascular system: S1 & S2 heard, RRR. No JVD, murmurs, rubs, gallops or clicks. No pedal edema. Gastrointestinal system: Abdomen is nondistended, soft and nontender. No organomegaly or masses felt. Normal bowel sounds heard. Central nervous system: Alert and oriented. No focal neurological deficits. Extremities: Symmetric 5 x 5 power. Skin: No rashes, lesions or ulcers Psychiatry: Judgement and insight appear normal. Mood & affect appropriate.     Data Reviewed: I have personally reviewed following labs and imaging studies  CBC:  Recent Labs Lab 01/23/16 1340 01/24/16 0404  WBC 11.5* 12.2*  HGB 15.0 15.0  HCT 44.7 43.9  MCV 90.9 90.0  PLT 254 [id number]   Basic Metabolic Panel:  Recent Labs Lab 01/23/16 1340 01/24/16 0404  NA 139 137  K 4.7 3.6  CL 104 99*  CO2 25 24  GLUCOSE 91 129*  BUN 21* 22*  CREATININE 1.41* 1.56*  CALCIUM 9.1 8.9   GFR: Estimated Creatinine Clearance: 36.6 mL/min (by C-G formula based on SCr of 1.56 mg/dL (H)). Liver Function Tests:  Recent Labs Lab 01/23/16 1340  AST 30  ALT 11*  ALKPHOS 81  BILITOT 1.4*  PROT 7.6  ALBUMIN 4.0   No results for input(s): LIPASE, AMYLASE in the last 168 hours. No results for input(s): AMMONIA in the last 168 hours. Coagulation Profile: No results for input(s): INR, PROTIME in the last 168 hours. Cardiac Enzymes: No results for input(s): CKTOTAL, CKMB, CKMBINDEX, TROPONINI in the last 168 hours. BNP (last 3 results) No results for input(s): PROBNP in the last 8760 hours. HbA1C: No results for input(s): HGBA1C in the last 72 hours. CBG:  Recent Labs Lab 01/23/16 2042 01/24/16 0803  GLUCAP 142* 134*   Lipid Profile: No results for input(s): CHOL, HDL, LDLCALC, TRIG, CHOLHDL, LDLDIRECT in the last 72 hours. Thyroid Function Tests: No results for  input(s): TSH, T4TOTAL, FREET4, T3FREE, THYROIDAB in the last 72 hours. Anemia Panel: No results for input(s): VITAMINB12, FOLATE, FERRITIN, TIBC, IRON, RETICCTPCT in the last 72 hours. Urine analysis:    Component Value Date/Time   COLORURINE STRAW (A) 01/23/2016 1520   APPEARANCEUR CLOUDY (A) 01/23/2016 1520   LABSPEC 1.005 01/23/2016 1520   PHURINE 5.0 01/23/2016 1520   GLUCOSEU NEGATIVE 01/23/2016 1520   HGBUR SMALL (A) 01/23/2016 1520   BILIRUBINUR NEGATIVE 01/23/2016 1520   BILIRUBINUR small 08/19/2012 1241   KETONESUR NEGATIVE 01/23/2016 1520   PROTEINUR NEGATIVE 01/23/2016 1520   UROBILINOGEN 0.2 08/19/2012 1241   UROBILINOGEN 0.2 03/20/2009 0645   NITRITE NEGATIVE 01/23/2016 1520   LEUKOCYTESUR LARGE (A) 01/23/2016 1520   Sepsis Labs: @LABRCNTIP (procalcitonin:4,lacticidven:4)  )No results found for this or any previous visit (from the past 240 hour(s)).       Radiology Studies: Ct Abdomen Pelvis W Contrast  Result Date: 01/24/2016 CLINICAL DATA:  Lower abdominal pain, weakness, history of Alzheimer's dementia, status post appendectomy, cholecystectomy, transurethral resection of the prostate EXAM: CT ABDOMEN AND PELVIS WITH CONTRAST TECHNIQUE: Multidetector CT imaging of the abdomen and pelvis was performed using the standard protocol following bolus administration of intravenous contrast. CONTRAST:  2mL ISOVUE-300 IOPAMIDOL (ISOVUE-300) INJECTION 61% COMPARISON:  None. FINDINGS: Lower chest: Lung bases are unremarkable. Partially visualized cardiac pacemaker leads. Borderline cardiomegaly. Hepatobiliary: The patient is status post cholecystectomy. Mild intrahepatic biliary ductal dilatation probable postcholecystectomy. No focal hepatic mass. Punctate calcifications are noted within liver and spleen probable from prior granulomatous disease. Pancreas: Enhanced pancreas is normal. Spleen: Enhanced spleen is normal size. Adrenals/Urinary Tract: No adrenal gland mass. Mild  lobulated renal contour. No hydronephrosis or hydroureter. Stable exophytic cyst posterior aspect of the left kidney measures 1.2 cm. Delayed renal images shows bilateral renal symmetrical excretion. Bilateral visualized proximal ureter is unremarkable. There is thickening of urinary bladder wall. Cystitis or chronic inflammation cannot be excluded. Stomach/Bowel: Oral contrast material was given to the patient. No small bowel obstruction. There is mild thickening of the wall of terminal ileum. Mild distal enteritis cannot be excluded. No pericecal inflammation. The patient is status post appendectomy. No colonic obstruction. Multiple diverticula are noted in sigmoid colon. No evidence of acute colitis or diverticulitis. Mild redundant sigmoid colon. Moderate gas and some stool noted within rectum Vascular/Lymphatic: No aortic aneurysm. Atherosclerotic calcifications of abdominal aorta and iliac arteries. Atherosclerotic calcifications are noted SMA origin and bilateral renal artery origin. No adenopathy. Reproductive: The patient is status post transurethral resection of prostate gland. No pelvic masses noted. Other: No ascites or free abdominal air.  No inguinal adenopathy. Musculoskeletal: Sagittal images of the spine shows osteopenia and mild degenerative changes thoracolumbar spine. IMPRESSION: 1. Status post cholecystectomy.  2. No hydronephrosis or hydroureter.  Stable left renal cyst. 3. There is mild thickening of the wall of terminal small bowel/terminal ileum. Mild distal enteritis cannot be excluded. 4. Status post appendectomy.  No pericecal inflammation. 5. Multiple sigmoid colon diverticula. No evidence of acute colitis or diverticulitis. 6. There is thickening of urinary bladder wall. Chronic inflammation or cystitis cannot be excluded. Status post transurethral resection of prostate gland 7. Mild degenerative changes thoracolumbar spine. 8. Moderate stool and gas noted within rectum. Electronically  Signed   By: Lahoma Crocker M.D.   On: 01/24/2016 10:57   Dg Chest Portable 1 View  Result Date: 01/23/2016 CLINICAL DATA:  Adenoma status EXAM: PORTABLE CHEST 1 VIEW COMPARISON:  October 27, 2015 FINDINGS: There is a pacemaker present with lead tips attached to the right atrium and right ventricle. No pneumothorax. No edema or consolidation. Heart is upper normal in size with pulmonary vascularity within normal limits. There is atherosclerotic calcification in the aorta. No adenopathy. No bone lesions. IMPRESSION: No edema or consolidation. Stable cardiac silhouette. Aortic atherosclerosis. Electronically Signed   By: Lowella Grip III M.D.   On: 01/23/2016 14:27        Scheduled Meds: . amLODipine  5 mg Oral Daily  . aspirin EC  81 mg Oral Daily  . atorvastatin  20 mg Oral QPM  . cefTRIAXone (ROCEPHIN)  IV  1 g Intravenous Q24H  . donepezil  23 mg Oral QHS  . enoxaparin (LOVENOX) injection  40 mg Subcutaneous Q24H  . ferrous sulfate  325 mg Oral BID  . HYDROcodone-acetaminophen  1 tablet Oral BID  . insulin aspart  0-9 Units Subcutaneous TID WC  . isosorbide mononitrate  30 mg Oral Daily  . memantine  10 mg Oral BID  . metoprolol succinate  12.5 mg Oral Q breakfast  . pantoprazole  40 mg Oral Daily  . polyethylene glycol  17 g Oral Daily  . pramipexole  0.25 mg Oral QPM  . sertraline  50 mg Oral Daily   Continuous Infusions: . sodium chloride       LOS: 0 days    Time spent: 40min    Domenic Polite, MD [facility] Pager [phone]  If 7PM-7AM, please contact night-coverage [email or web address] Password [email or web address] 01/24/2016, 12:05 PM

## 2016-01-24 NOTE — Care Management Note (Addendum)
Case Management Note  Patient Details  Name: Jon Davis MRN: [id number] Date of Birth: Oct 16, 1926  Subjective/Objective:        CM following for progression and d/c planning.             Action/Plan: 01/24/2016 Pt is resident of [facility] per [place] at that facility this pt's care is managed by his wife and she has hired independently an agency which provide [facility] aides daily for the pt's care about 7 hr per day. Will followup with pt wife to clarify that this is her plan to continue, upon d/c.  Both pt and his wife reside in independent living.   Expected Discharge Date:                  Expected Discharge Plan:  [place]  In-House Referral:  Clinical Social Work  Discharge planning Services  CM Consult  Post Acute Care Choice:    Choice offered to:     DME Arranged:    DME Agency:     HH Arranged:    [place] Agency:     Status of Service:  In process, will continue to follow  If discussed at Long Length of Stay Meetings, dates discussed:    Additional Comments:  Adron Bene, RN 01/24/2016, 11:05 AM

## 2016-01-25 LAB — BASIC METABOLIC PANEL
ANION GAP: 8 (ref 5–15)
BUN: 23 mg/dL — ABNORMAL HIGH (ref 6–20)
CALCIUM: 8.7 mg/dL — AB (ref 8.9–10.3)
CO2: 28 mmol/L (ref 22–32)
CREATININE: 1.58 mg/dL — AB (ref 0.61–1.24)
Chloride: 102 mmol/L (ref 101–111)
GFR, EST AFRICAN AMERICAN: 44 mL/min — AB (ref >60)
GFR, EST NON AFRICAN AMERICAN: 38 mL/min — AB (ref >60)
Glucose, Bld: 133 mg/dL — ABNORMAL HIGH (ref 65–99)
Potassium: 3.7 mmol/L (ref 3.5–5.1)
SODIUM: 138 mmol/L (ref 135–145)

## 2016-01-25 LAB — CBC
HCT: 42.4 % (ref 39.0–52.0)
HEMOGLOBIN: 14 g/dL (ref 13.0–17.0)
MCH: 29.9 pg (ref 26.0–34.0)
MCHC: 33 g/dL (ref 30.0–36.0)
MCV: 90.6 fL (ref 78.0–100.0)
PLATELETS: 227 10*3/uL (ref 150–400)
RBC: 4.68 MIL/uL (ref 4.22–5.81)
RDW: 13.5 % (ref 11.5–15.5)
WBC: 10.3 10*3/uL (ref 4.0–10.5)

## 2016-01-25 LAB — GLUCOSE, CAPILLARY
Glucose-Capillary: 122 mg/dL — ABNORMAL HIGH (ref 65–99)
Glucose-Capillary: 242 mg/dL — ABNORMAL HIGH (ref 65–99)
Glucose-Capillary: 150 mg/dL — ABNORMAL HIGH (ref 65–99)
Glucose-Capillary: 139 mg/dL — ABNORMAL HIGH (ref 65–99)

## 2016-01-25 NOTE — Progress Notes (Signed)
PROGRESS NOTE    Jon Davis  [id number] DOB: 1926-04-26 DOA: 01/23/2016 PCP: Jon Pao, MD  Brief Narrative: Jon Davis is a 82 y.o. male with medical history significant of A-fib, tachy brady syndrome with pacemaker, Alzheimer's dementia, HTN, DM who is sent by his wife from independent living for weakness and decreased Po intake for 2-3 days.  UA suggestive of UTI, CT abd pelvis unremarkable  Assessment & Plan:   UTI -continue IV ceftriaxone -Urine cx with Klebsiella -CT abd unremarkable -clinically improving  Failure to thrive -due to above, improving  Mild AKI - cut down IVF, baseline 1.1 -stable, stop IVF  Alzheimer's dementia - cont Aricept and Namenda -stable this am     Type 2 diabetes mellitus with vascular disease  -hold Glipizide , SSI    Atrial fibrillation  - permanent - CHA2DS2-VASc Score- 5 -follows with [facility] - cont ASA 81 mg and Metoprolol  -not on anticoagulation per cards  HTN  -stable, cotninue Norvasc, Imdur and Metoprolol  CAD - Imdur, Metoprolol, ASA  Pacemaker for tachy/brady syndrome  DVT prophylaxis: Lovenox Code Status: Full code  Family Communication:  called and updated wife Jon Davis 12/14 Disposition Plan: return to independent living tomorrow if stable pending urine Cx     Antimicrobials: Ceftriaxone 12/13   Subjective: Feels better, no complaints  Objective: Vitals:   01/24/16 1139 01/24/16 2224 01/25/16 0625 01/25/16 0859  BP: (!) 118/59 130/73 (!) 153/59 (!) 168/68  Pulse: 61 69 (!) 59 67  Resp: 18 18 17 17   Temp: 98 F (36.7 C) 98.7 F (37.1 C) 97.3 F (36.3 C) 97.7 F (36.5 C)  TempSrc: Oral Oral Oral Oral  SpO2: 99% 95% 97% 98%  Weight:  86.5 kg (190 lb 11.2 oz)    Height:        Intake/Output Summary (Last 24 hours) at 01/25/16 1406 Last data filed at 01/25/16 1346  Gross per 24 hour  Intake             1945 ml  Output                0 ml  Net             1945 ml    Filed Weights   01/23/16 1947 01/24/16 2224  Weight: 86.7 kg (191 lb 2.2 oz) 86.5 kg (190 lb 11.2 oz)    Examination:  General exam: Appears calm and comfortable, AAOx2 Respiratory system: Clear to auscultation. Respiratory effort normal. Cardiovascular system: S1 & S2 heard, RRR. No JVD, murmurs, rubs, gallops or clicks. No pedal edema. Gastrointestinal system: Abdomen is nondistended, soft and nontender. No organomegaly or masses felt. Normal bowel sounds heard. Central nervous system: Alert and oriented. No focal neurological deficits. Extremities: Symmetric 5 x 5 power. Skin: No rashes, lesions or ulcers Psychiatry: Judgement and insight appear normal. Mood & affect appropriate.     Data Reviewed: I have personally reviewed following labs and imaging studies  CBC:  Recent Labs Lab 01/23/16 1340 01/24/16 0404 01/25/16 0528  WBC 11.5* 12.2* 10.3  HGB 15.0 15.0 14.0  HCT 44.7 43.9 42.4  MCV 90.9 90.0 90.6  PLT 254 250 [id number]   Basic Metabolic Panel:  Recent Labs Lab 01/23/16 1340 01/24/16 0404 01/25/16 0528  NA 139 137 138  K 4.7 3.6 3.7  CL 104 99* 102  CO2 25 24 28   GLUCOSE 91 129* 133*  BUN 21* 22* 23*  CREATININE 1.41* 1.56* 1.58*  CALCIUM  9.1 8.9 8.7*   GFR: Estimated Creatinine Clearance: 36.2 mL/min (by C-G formula based on SCr of 1.58 mg/dL (H)). Liver Function Tests:  Recent Labs Lab 01/23/16 1340  AST 30  ALT 11*  ALKPHOS 81  BILITOT 1.4*  PROT 7.6  ALBUMIN 4.0   No results for input(s): LIPASE, AMYLASE in the last 168 hours. No results for input(s): AMMONIA in the last 168 hours. Coagulation Profile: No results for input(s): INR, PROTIME in the last 168 hours. Cardiac Enzymes: No results for input(s): CKTOTAL, CKMB, CKMBINDEX, TROPONINI in the last 168 hours. BNP (last 3 results) No results for input(s): PROBNP in the last 8760 hours. HbA1C: No results for input(s): HGBA1C in the last 72 hours. CBG:  Recent Labs Lab  01/24/16 1223 01/24/16 1806 01/24/16 2228 01/25/16 0752 01/25/16 1131  GLUCAP 110* 157* 235* 122* 242*   Lipid Profile: No results for input(s): CHOL, HDL, LDLCALC, TRIG, CHOLHDL, LDLDIRECT in the last 72 hours. Thyroid Function Tests: No results for input(s): TSH, T4TOTAL, FREET4, T3FREE, THYROIDAB in the last 72 hours. Anemia Panel: No results for input(s): VITAMINB12, FOLATE, FERRITIN, TIBC, IRON, RETICCTPCT in the last 72 hours. Urine analysis:    Component Value Date/Time   COLORURINE STRAW (A) 01/23/2016 1520   APPEARANCEUR CLOUDY (A) 01/23/2016 1520   LABSPEC 1.005 01/23/2016 1520   PHURINE 5.0 01/23/2016 1520   GLUCOSEU NEGATIVE 01/23/2016 1520   HGBUR SMALL (A) 01/23/2016 1520   BILIRUBINUR NEGATIVE 01/23/2016 1520   BILIRUBINUR small 08/19/2012 1241   KETONESUR NEGATIVE 01/23/2016 1520   PROTEINUR NEGATIVE 01/23/2016 1520   UROBILINOGEN 0.2 08/19/2012 1241   UROBILINOGEN 0.2 03/20/2009 0645   NITRITE NEGATIVE 01/23/2016 1520   LEUKOCYTESUR LARGE (A) 01/23/2016 1520   Sepsis Labs: @LABRCNTIP (procalcitonin:4,lacticidven:4)  ) Recent Results (from the past 240 hour(s))  Urine culture     Status: Abnormal (Preliminary result)   Collection Time: 01/23/16  3:20 PM  Result Value Ref Range Status   Specimen Description URINE, RANDOM  Final   Special Requests NONE  Final   Culture >=100,000 COLONIES/mL KLEBSIELLA PNEUMONIAE (A)  Final   Report Status PENDING  Incomplete  MRSA PCR Screening     Status: Abnormal   Collection Time: 01/24/16 11:45 AM  Result Value Ref Range Status   MRSA by PCR POSITIVE (A) NEGATIVE Final    Comment:        The GeneXpert MRSA Assay (FDA approved for NASAL specimens only), is one component of a comprehensive MRSA colonization surveillance program. It is not intended to diagnose MRSA infection nor to guide or monitor treatment for MRSA infections. RESULT CALLED TO, READ BACK BY AND VERIFIED WITH: Jon Farrier RN 13:45 01/24/16  ([email or web address])          Radiology Studies: Ct Abdomen Pelvis W Contrast  Result Date: 01/24/2016 CLINICAL DATA:  Lower abdominal pain, weakness, history of Alzheimer's dementia, status post appendectomy, cholecystectomy, transurethral resection of the prostate EXAM: CT ABDOMEN AND PELVIS WITH CONTRAST TECHNIQUE: Multidetector CT imaging of the abdomen and pelvis was performed using the standard protocol following bolus administration of intravenous contrast. CONTRAST:  8mL ISOVUE-300 IOPAMIDOL (ISOVUE-300) INJECTION 61% COMPARISON:  None. FINDINGS: Lower chest: Lung bases are unremarkable. Partially visualized cardiac pacemaker leads. Borderline cardiomegaly. Hepatobiliary: The patient is status post cholecystectomy. Mild intrahepatic biliary ductal dilatation probable postcholecystectomy. No focal hepatic mass. Punctate calcifications are noted within liver and spleen probable from prior granulomatous disease. Pancreas: Enhanced pancreas is normal. Spleen: Enhanced spleen is normal size. Adrenals/Urinary Tract:  No adrenal gland mass. Mild lobulated renal contour. No hydronephrosis or hydroureter. Stable exophytic cyst posterior aspect of the left kidney measures 1.2 cm. Delayed renal images shows bilateral renal symmetrical excretion. Bilateral visualized proximal ureter is unremarkable. There is thickening of urinary bladder wall. Cystitis or chronic inflammation cannot be excluded. Stomach/Bowel: Oral contrast material was given to the patient. No small bowel obstruction. There is mild thickening of the wall of terminal ileum. Mild distal enteritis cannot be excluded. No pericecal inflammation. The patient is status post appendectomy. No colonic obstruction. Multiple diverticula are noted in sigmoid colon. No evidence of acute colitis or diverticulitis. Mild redundant sigmoid colon. Moderate gas and some stool noted within rectum Vascular/Lymphatic: No aortic aneurysm. Atherosclerotic calcifications of  abdominal aorta and iliac arteries. Atherosclerotic calcifications are noted SMA origin and bilateral renal artery origin. No adenopathy. Reproductive: The patient is status post transurethral resection of prostate gland. No pelvic masses noted. Other: No ascites or free abdominal air.  No inguinal adenopathy. Musculoskeletal: Sagittal images of the spine shows osteopenia and mild degenerative changes thoracolumbar spine. IMPRESSION: 1. Status post cholecystectomy. 2. No hydronephrosis or hydroureter.  Stable left renal cyst. 3. There is mild thickening of the wall of terminal small bowel/terminal ileum. Mild distal enteritis cannot be excluded. 4. Status post appendectomy.  No pericecal inflammation. 5. Multiple sigmoid colon diverticula. No evidence of acute colitis or diverticulitis. 6. There is thickening of urinary bladder wall. Chronic inflammation or cystitis cannot be excluded. Status post transurethral resection of prostate gland 7. Mild degenerative changes thoracolumbar spine. 8. Moderate stool and gas noted within rectum. Electronically Signed   By: Lahoma Crocker M.D.   On: 01/24/2016 10:57   Dg Chest Portable 1 View  Result Date: 01/23/2016 CLINICAL DATA:  Adenoma status EXAM: PORTABLE CHEST 1 VIEW COMPARISON:  October 27, 2015 FINDINGS: There is a pacemaker present with lead tips attached to the right atrium and right ventricle. No pneumothorax. No edema or consolidation. Heart is upper normal in size with pulmonary vascularity within normal limits. There is atherosclerotic calcification in the aorta. No adenopathy. No bone lesions. IMPRESSION: No edema or consolidation. Stable cardiac silhouette. Aortic atherosclerosis. Electronically Signed   By: Lowella Grip III M.D.   On: 01/23/2016 14:27        Scheduled Meds: . amLODipine  5 mg Oral Daily  . aspirin EC  81 mg Oral Daily  . atorvastatin  20 mg Oral QPM  . cefTRIAXone (ROCEPHIN)  IV  1 g Intravenous Q24H  . Chlorhexidine  Gluconate Cloth  6 each Topical Q0600  . donepezil  23 mg Oral QHS  . enoxaparin (LOVENOX) injection  40 mg Subcutaneous Q24H  . ferrous sulfate  325 mg Oral BID  . HYDROcodone-acetaminophen  1 tablet Oral BID  . insulin aspart  0-9 Units Subcutaneous TID WC  . isosorbide mononitrate  30 mg Oral Daily  . memantine  10 mg Oral BID  . metoprolol succinate  12.5 mg Oral Q breakfast  . mupirocin ointment  1 application Nasal BID  . pantoprazole  40 mg Oral Daily  . polyethylene glycol  17 g Oral Daily  . pramipexole  0.25 mg Oral QPM  . sertraline  50 mg Oral Daily   Continuous Infusions: . sodium chloride 75 mL/hr at 01/25/16 0517     LOS: 1 day    Time spent: 65min    Domenic Polite, MD [facility] Pager [phone]  If 7PM-7AM, please contact night-coverage [email or web address] Password [email or web address]  01/25/2016, 2:06 PM

## 2016-01-25 NOTE — Care Management Obs Status (Signed)
[place] NOTIFICATION   Patient Details  Name: CHEN SAADEH MRN: [id number] Date of Birth: [birth year]   Medicare Observation Status Notification Given:  Yes OBS notification given on 01/24/2016, later pt met for INPT criteria.    Markela Wee, Rory Percy, RN 01/25/2016, 11:43 AM

## 2016-01-26 LAB — URINE CULTURE: Culture: >=100000 — AB

## 2016-01-26 LAB — GLUCOSE, CAPILLARY
GLUCOSE-CAPILLARY: 127 mg/dL — AB (ref 65–99)
GLUCOSE-CAPILLARY: 194 mg/dL — AB (ref 65–99)

## 2016-01-26 MED ORDER — CEPHALEXIN 250 MG PO CAPS
250.0000 mg | ORAL_CAPSULE | Freq: Three times a day (TID) | ORAL | Status: DC
  Filled 2016-01-26: qty 1

## 2016-01-26 MED ORDER — CEPHALEXIN 250 MG PO CAPS: 250.0000 mg | ORAL_CAPSULE | Freq: Three times a day (TID) | ORAL | 0 refills | Status: DC

## 2016-01-26 NOTE — Progress Notes (Signed)
Discharge instructions and medications discussed with caregiver Joneen Boers.  Prescription given to [facility].  All questions answered.

## 2016-01-26 NOTE — Progress Notes (Signed)
Patient is possible discharge today. The plan is to go home with Jon Davis (Husband to patient's caregiver) with this phone number [phone] per report.

## 2016-01-26 NOTE — Discharge Summary (Signed)
Physician Discharge Summary  Jon Davis [id number] DOB: September 19, 1926 DOA: 01/23/2016  PCP: Haywood Pao, MD  Admit date: 01/23/2016 Discharge date: 01/26/2016  Time spent: 35 minutes  Recommendations for Outpatient Follow-up:  1. PCP Dr.Tisovec in 1 week   Discharge Diagnoses:  Principal Problem:   UTI (urinary tract infection) Active Problems:   Type 2 diabetes mellitus with vascular disease ([place])   Atrial fibrillation (HCC)   Pacemaker   AKI (acute kidney injury) [facility])   Urinary retention   Discharge Condition: stable  Diet recommendation: diabetic  Filed Weights   01/23/16 1947 01/24/16 2224 01/25/16 2227  Weight: 86.7 kg (191 lb 2.2 oz) 86.5 kg (190 lb 11.2 oz) 88.2 kg (194 lb 6.4 oz)    History of present illness:  Jon E Burnettis a 82 y.o.malewith medical history significant of A-fib, tachy brady syndrome with pacemaker, Alzheimer's dementia, HTN, DM who is sent by his wifefrom independent living for weakness and decreased Po intake for 2-3 days.  UA suggestive of UTI, CT abd pelvis unremarkable   Hospital Course:   Klebsiella UTI -treated with IV ceftriaxone -Urine cx grew Klebsiella which was pansensitive -CT abd unremarkable -clinically much improved and transitioned to oral keflex at discharge for 46more days  Failure to thrive -due to above, improved  Mild AKI -improved with hydration  Alzheimer's dementia - cont Aricept and Namenda - stable   Type 2 diabetes mellitus with vascular disease  -resumed Glipizide   Atrial fibrillation - permanent - CHA2DS2-VASc Score- 5 - follows with [facility] - cont ASA 81 mg and Metoprolol  - not on anticoagulation per cards  HTN  -stable, cotninue Norvasc, Imdur and Metoprolol  CAD - Imdur, Metoprolol, ASA  Pacemaker for tachy/brady syndrome   Discharge Exam: Vitals:   01/25/16 2227 01/26/16 0445  BP: (!) 139/57 (!) 152/62  Pulse: 66 61  Resp: 16 17  Temp: 98.1 F  (36.7 C) 98.2 F (36.8 C)    General: AAOx3 Cardiovascular: S1S2/RRR Respiratory: CTAB  Discharge Instructions   Discharge Instructions    Diet - low sodium heart healthy    Complete by:  As directed    Diet Carb Modified    Complete by:  As directed    Increase activity slowly    Complete by:  As directed      Current Discharge Medication List    START taking these medications   Details  cephALEXin (KEFLEX) 250 MG capsule Take 1 capsule (250 mg total) by mouth every 8 (eight) hours. For 3days Qty: 9 capsule, Refills: 0      CONTINUE these medications which have NOT CHANGED   Details  ALPRAZolam (XANAX) 0.25 MG tablet Take 0.25 mg by mouth 2 (two) times daily as needed for anxiety.     amLODipine (NORVASC) 5 MG tablet Take 5 mg by mouth daily.     aspirin 81 MG tablet Take 81 mg by mouth daily.    atorvastatin (LIPITOR) 20 MG tablet Take 20 mg by mouth every evening.     benzonatate (TESSALON) 100 MG capsule Take 100 mg by mouth 2 (two) times daily as needed for cough.    donepezil (ARICEPT) 23 MG TABS tablet Take 1 tablet (23 mg total) by mouth at bedtime. Qty: 30 tablet, Refills: 0    ferrous sulfate 325 (65 FE) MG tablet Take 325 mg by mouth 2 (two) times daily.    glipiZIDE (GLUCOTROL) 10 MG tablet Take 10 mg by mouth daily. Refills: 5  HYDROcodone-acetaminophen (NORCO/VICODIN) 5-325 MG per tablet Take 1 tablet by mouth 2 (two) times daily.     isosorbide mononitrate (IMDUR) 30 MG 24 hr tablet Take 30 mg by mouth daily.    memantine (NAMENDA) 10 MG tablet Take 10 mg by mouth 2 (two) times daily.    metoprolol succinate (TOPROL-XL) 25 MG 24 hr tablet Take 0.5 tablets (12.5 mg total) by mouth daily. Take with or immediately following a meal. Qty: 15 tablet, Refills: 11    pantoprazole (PROTONIX) 40 MG tablet Take 40 mg by mouth daily.    pramipexole (MIRAPEX) 0.125 MG tablet Take 2 tablets (0.25 mg total) by mouth every evening. Two hours before  bedtime Qty: 90 tablet, Refills: 3   Associated Diagnoses: Restless legs syndrome (RLS); Neurodegenerative cognitive impairment; Uncontrolled REM sleep behavior disorder    sertraline (ZOLOFT) 50 MG tablet Take 50 mg by mouth daily.       Allergies  Allergen Reactions  . Penicillins Rash    Has patient had a PCN reaction causing immediate rash, facial/tongue/throat swelling, SOB or lightheadedness with hypotension: No  Has patient had a PCN reaction causing severe rash involving mucus membranes or skin necrosis: unknown Has patient had a PCN reaction that required hospitalization: No  Has patient had a PCN reaction occurring within the last 10 years: No  If all of the above answers are "NO", then may proceed with Cephalosporin use.   Follow-up Information    Haywood Pao, MD. Schedule an appointment as soon as possible for a visit in 1 week(s).   Specialty:  Internal Medicine Contact information: [address] [place] [address] [phone]            The results of significant diagnostics from this hospitalization (including imaging, microbiology, ancillary and laboratory) are listed below for reference.    Significant Diagnostic Studies: Ct Abdomen Pelvis W Contrast  Result Date: 01/24/2016 CLINICAL DATA:  Lower abdominal pain, weakness, history of Alzheimer's dementia, status post appendectomy, cholecystectomy, transurethral resection of the prostate EXAM: CT ABDOMEN AND PELVIS WITH CONTRAST TECHNIQUE: Multidetector CT imaging of the abdomen and pelvis was performed using the standard protocol following bolus administration of intravenous contrast. CONTRAST:  15mL ISOVUE-300 IOPAMIDOL (ISOVUE-300) INJECTION 61% COMPARISON:  None. FINDINGS: Lower chest: Lung bases are unremarkable. Partially visualized cardiac pacemaker leads. Borderline cardiomegaly. Hepatobiliary: The patient is status post cholecystectomy. Mild intrahepatic biliary ductal dilatation probable  postcholecystectomy. No focal hepatic mass. Punctate calcifications are noted within liver and spleen probable from prior granulomatous disease. Pancreas: Enhanced pancreas is normal. Spleen: Enhanced spleen is normal size. Adrenals/Urinary Tract: No adrenal gland mass. Mild lobulated renal contour. No hydronephrosis or hydroureter. Stable exophytic cyst posterior aspect of the left kidney measures 1.2 cm. Delayed renal images shows bilateral renal symmetrical excretion. Bilateral visualized proximal ureter is unremarkable. There is thickening of urinary bladder wall. Cystitis or chronic inflammation cannot be excluded. Stomach/Bowel: Oral contrast material was given to the patient. No small bowel obstruction. There is mild thickening of the wall of terminal ileum. Mild distal enteritis cannot be excluded. No pericecal inflammation. The patient is status post appendectomy. No colonic obstruction. Multiple diverticula are noted in sigmoid colon. No evidence of acute colitis or diverticulitis. Mild redundant sigmoid colon. Moderate gas and some stool noted within rectum Vascular/Lymphatic: No aortic aneurysm. Atherosclerotic calcifications of abdominal aorta and iliac arteries. Atherosclerotic calcifications are noted SMA origin and bilateral renal artery origin. No adenopathy. Reproductive: The patient is status post transurethral resection of prostate  gland. No pelvic masses noted. Other: No ascites or free abdominal air.  No inguinal adenopathy. Musculoskeletal: Sagittal images of the spine shows osteopenia and mild degenerative changes thoracolumbar spine. IMPRESSION: 1. Status post cholecystectomy. 2. No hydronephrosis or hydroureter.  Stable left renal cyst. 3. There is mild thickening of the wall of terminal small bowel/terminal ileum. Mild distal enteritis cannot be excluded. 4. Status post appendectomy.  No pericecal inflammation. 5. Multiple sigmoid colon diverticula. No evidence of acute colitis or  diverticulitis. 6. There is thickening of urinary bladder wall. Chronic inflammation or cystitis cannot be excluded. Status post transurethral resection of prostate gland 7. Mild degenerative changes thoracolumbar spine. 8. Moderate stool and gas noted within rectum. Electronically Signed   By: Lahoma Crocker M.D.   On: 01/24/2016 10:57   Dg Chest Portable 1 View  Result Date: 01/23/2016 CLINICAL DATA:  Adenoma status EXAM: PORTABLE CHEST 1 VIEW COMPARISON:  October 27, 2015 FINDINGS: There is a pacemaker present with lead tips attached to the right atrium and right ventricle. No pneumothorax. No edema or consolidation. Heart is upper normal in size with pulmonary vascularity within normal limits. There is atherosclerotic calcification in the aorta. No adenopathy. No bone lesions. IMPRESSION: No edema or consolidation. Stable cardiac silhouette. Aortic atherosclerosis. Electronically Signed   By: Lowella Grip III M.D.   On: 01/23/2016 14:27    Microbiology: Recent Results (from the past 240 hour(s))  Urine culture     Status: Abnormal   Collection Time: 01/23/16  3:20 PM  Result Value Ref Range Status   Specimen Description URINE, RANDOM  Final   Special Requests NONE  Final   Culture >=100,000 COLONIES/mL KLEBSIELLA PNEUMONIAE (A)  Final   Report Status 01/26/2016 FINAL  Final   Organism ID, Bacteria KLEBSIELLA PNEUMONIAE (A)  Final      Susceptibility   Klebsiella pneumoniae - MIC*    AMPICILLIN 16 RESISTANT Resistant     CEFAZOLIN <=4 SENSITIVE Sensitive     CEFTRIAXONE <=1 SENSITIVE Sensitive     CIPROFLOXACIN <=0.25 SENSITIVE Sensitive     GENTAMICIN <=1 SENSITIVE Sensitive     IMIPENEM <=0.25 SENSITIVE Sensitive     NITROFURANTOIN 64 INTERMEDIATE Intermediate     TRIMETH/SULFA <=20 SENSITIVE Sensitive     AMPICILLIN/SULBACTAM 4 SENSITIVE Sensitive     PIP/TAZO <=4 SENSITIVE Sensitive     Extended ESBL NEGATIVE Sensitive     * >=100,000 COLONIES/mL KLEBSIELLA PNEUMONIAE  MRSA  PCR Screening     Status: Abnormal   Collection Time: 01/24/16 11:45 AM  Result Value Ref Range Status   MRSA by PCR POSITIVE (A) NEGATIVE Final    Comment:        The GeneXpert MRSA Assay (FDA approved for NASAL specimens only), is one component of a comprehensive MRSA colonization surveillance program. It is not intended to diagnose MRSA infection nor to guide or monitor treatment for MRSA infections. RESULT CALLED TO, READ BACK BY AND VERIFIED WITH: Sylvie Farrier RN 13:45 01/24/16 ([email or web address])      Labs: Basic Metabolic Panel:  Recent Labs Lab 01/23/16 1340 01/24/16 0404 01/25/16 0528  NA 139 137 138  K 4.7 3.6 3.7  CL 104 99* 102  CO2 25 24 28   GLUCOSE 91 129* 133*  BUN 21* 22* 23*  CREATININE 1.41* 1.56* 1.58*  CALCIUM 9.1 8.9 8.7*   Liver Function Tests:  Recent Labs Lab 01/23/16 1340  AST 30  ALT 11*  ALKPHOS 81  BILITOT 1.4*  PROT 7.6  ALBUMIN 4.0   No results for input(s): LIPASE, AMYLASE in the last 168 hours. No results for input(s): AMMONIA in the last 168 hours. CBC:  Recent Labs Lab 01/23/16 1340 01/24/16 0404 01/25/16 0528  WBC 11.5* 12.2* 10.3  HGB 15.0 15.0 14.0  HCT 44.7 43.9 42.4  MCV 90.9 90.0 90.6  PLT 254 250 227   Cardiac Enzymes: No results for input(s): CKTOTAL, CKMB, CKMBINDEX, TROPONINI in the last 168 hours. BNP: BNP (last 3 results) No results for input(s): BNP in the last 8760 hours.  ProBNP (last 3 results) No results for input(s): PROBNP in the last 8760 hours.  CBG:  Recent Labs Lab 01/25/16 0752 01/25/16 1131 01/25/16 1613 01/25/16 2232 01/26/16 0752  GLUCAP 122* 242* 150* 139* 127*       SignedDomenic Polite MD.  [facility] 01/26/2016, 10:40 AM

## 2016-02-07 ENCOUNTER — Ambulatory Visit (INDEPENDENT_AMBULATORY_CARE_PROVIDER_SITE_OTHER): Payer: Medicare Other | Admitting: Neurology

## 2016-02-07 ENCOUNTER — Encounter: Payer: Self-pay | Admitting: Neurology

## 2016-02-07 VITALS — BP 122/60 | HR 72 | Resp 16 | Ht 72.0 in | Wt 206.0 lb

## 2016-02-07 DIAGNOSIS — F0390 Unspecified dementia without behavioral disturbance: Secondary | ICD-10-CM | POA: Diagnosis not present

## 2016-02-07 DIAGNOSIS — I2510 Atherosclerotic heart disease of native coronary artery without angina pectoris: Secondary | ICD-10-CM | POA: Diagnosis not present

## 2016-02-07 MED ORDER — SERTRALINE HCL 50 MG PO TABS: 50.0000 mg | ORAL_TABLET | Freq: Every day | ORAL | 3 refills | Status: DC

## 2016-02-07 MED ORDER — MEMANTINE HCL 10 MG PO TABS: 10.0000 mg | ORAL_TABLET | Freq: Two times a day (BID) | ORAL | 3 refills | Status: AC

## 2016-02-07 MED ORDER — DONEPEZIL HCL 23 MG PO TABS: 23.0000 mg | ORAL_TABLET | Freq: Every day | ORAL | 0 refills | Status: AC

## 2016-02-07 NOTE — Progress Notes (Signed)
[name]  PATIENT: DERICK RANKE DOB: 23-Nov-1926   REASON FOR VISIT: Follow-up for memory loss restless legs REM sleep behavior disorder HISTORY FROM: Caregiver from [facility], patient and wife    HISTORY OF PRESENT ILLNESS:Mr. Donais is an 82 year old male with a history of memory loss and RLS. He returns today for follow-up. He was last seen in this office 06/29/14. He is currently taking Aricept and Namenda tolerating it well. He reports that his memory has declined.  He states that he notices the most with his short term memory. He forgets people names and has to ask questions over and over. He is able to complete all ADLs with assistance. Denies having to give up anything due to his memory. He no longer operates a motor vehicle but that was not do to his memory. They gave their vehicle to the grandson. They have nurses that come in from [facility] twice weekly. He continues to take Mirapex and xanax for RLS and reports that this is working well. He states that he is sleeping well.[name] His appetite is good. He returns for reevaluation   HISTORY 05/26/13 (CM): 82 year old male returns for followup. He was initially evaluated for memory loss by Dr. Brett Fairy 11/24/2012. He continues to have short-term memory problems. He also has restless leg syndrome is under better control with increasing his Mirapex. 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 He is ambulating with a cane, he denies any falls. He is alone at today's visit. He denies any side effects to his Aricept or other medications. He has not had compulsive behaviors on Mirapex, he returns for reevaluation  02-07-2016,  I have pleasure of seeing Mr. Schettler today, shortly after his discharge from the hospital for a prolonged bout with urosepsis. He has become very sedated, appears to sleep most of the day, he still is ambulatory with a 4 pronged cane. His  memory testing has shown a trend downwards. He is here today with a caretaker, who reports that Mr. Shimomura takes a good breakfast but eats a lot of sweets , cookies etc. for the rest of the day. Most of his meals are not low-salt. His wife is moody and takes pain pills in great numbers, sometimes his Xanax, too. Dementia has been exacerbated by UTI>   REVIEW OF SYSTEMS: Full 14 system review of systems performed and notable only for those listed, all others are neg:  Constitutional: Fatigue  Ear/Nose/Throat: Hearing loss  Musculoskeletal: Walking difficulty Neurological: Memory loss Psychiatric: Anxiety Sleep : Restless leg   ALLERGIES: Allergies  Allergen Reactions  . Penicillins Rash    Has patient had a PCN reaction causing immediate rash, facial/tongue/throat swelling, SOB or lightheadedness with hypotension: No  Has patient had a PCN reaction causing severe rash involving mucus membranes or skin necrosis: unknown Has patient had a PCN reaction that required hospitalization: No  Has patient had a PCN reaction occurring within the last 10 years: No  If all of the above answers are "NO", then may proceed with Cephalosporin use.    HOME MEDICATIONS: Outpatient Medications Prior to Visit  Medication Sig Dispense Refill  . ALPRAZolam (XANAX) 0.25 MG tablet Take 0.25 mg by mouth 2 (two) times daily as needed for anxiety.     [name] amLODipine (NORVASC) 5 MG tablet Take 5 mg by mouth daily.     [name] aspirin 81 MG tablet Take 81 mg by mouth daily.    [name]  atorvastatin (LIPITOR) 20 MG tablet Take 20 mg by mouth every evening.     . benzonatate (TESSALON) 100 MG capsule Take 100 mg by mouth 2 (two) times daily as needed for cough.    . donepezil (ARICEPT) 23 MG TABS tablet Take 1 tablet (23 mg total) by mouth at bedtime. 30 tablet 0  . ferrous sulfate 325 (65 FE) MG tablet Take 325 mg by mouth 2 (two) times daily.    [name] glipiZIDE (GLUCOTROL) 10 MG tablet Take 10 mg by mouth daily.  5  .  HYDROcodone-acetaminophen (NORCO/VICODIN) 5-325 MG per tablet Take 1 tablet by mouth 2 (two) times daily.     . isosorbide mononitrate (IMDUR) 30 MG 24 hr tablet Take 30 mg by mouth daily.    . memantine (NAMENDA) 10 MG tablet Take 10 mg by mouth 2 (two) times daily.    . metoprolol succinate (TOPROL-XL) 25 MG 24 hr tablet Take 0.5 tablets (12.5 mg total) by mouth daily. Take with or immediately following a meal. 15 tablet 11  . pantoprazole (PROTONIX) 40 MG tablet Take 40 mg by mouth daily.    . pramipexole (MIRAPEX) 0.125 MG tablet Take 2 tablets (0.25 mg total) by mouth every evening. Two hours before bedtime 90 tablet 3  . sertraline (ZOLOFT) 50 MG tablet Take 50 mg by mouth daily.    . cephALEXin (KEFLEX) 250 MG capsule Take 1 capsule (250 mg total) by mouth every 8 (eight) hours. For 3days 9 capsule 0   No facility-administered medications prior to visit.     PAST MEDICAL HISTORY: Past Medical History:  Diagnosis Date  . Anemia 06/2012  . Atrial fibrillation ([place])   . BPH (benign prostatic hyperplasia)    Followed by Dr. Matilde Sprang  . Brady-tachy syndrome ([place])    04/17/2009 MDT pacer Enrhythmatr fib  . CAD (coronary artery disease)   . Dementia in Alzheimer's disease   . Depression   . Diabetes ([place])   . Diverticulosis 02/26/2010   Sigmoid colon  . Duodenitis 02/26/2010  . Elevated PSA   . Episodic VTach, pacer    Followed by Dr. Avon Gully  . Fall 03/2014   elbow fluid   . Gastritis 02/26/2010   moderate  . Hiatal hernia 02/26/2010  . Hyperlipidemia   . Hypertension   . Memory loss   . Microalbuminuria   . Urinary incontinence     PAST SURGICAL HISTORY: Past Surgical History:  Procedure Laterality Date  . APPENDECTOMY  1948  . CARDIAC CATHETERIZATION  07/07/2007   mild to mod. ca+ w/narrowing of 20% ostium of second diagonal,diffuse 80% stenosis small caliber infer. branch,20% mid LAD,60-70% narrowing mid atrioventricular groove CX,mild 20-30% RCA  . CAROTID DOPPLER   10/01/2006   0-49% right bulb,prox,ECA & prox ICA,0-49% left subclavian bulb & prox ICA,right & left ICA abn. resistance  . Cataracts    . CHOLECYSTECTOMY    . CIRCUMCISION N/A 10/28/2015   Procedure: CIRCUMCISION ADULT, DORSAL SLIT;  Surgeon: Ardis Hughs, MD;  Location: [facility];  Service: Urology;  Laterality: N/A;  . NM MYOVIEW LTD  10/01/2010   No ischemia  . PERMANENT PACEMAKER INSERTION  04/17/2009   MDT Enrhythm  . TRANSURETHRAL RESECTION OF PROSTATE N/A 10/28/2015   Procedure: TRANSURETHRAL RESECTION OF THE PROSTATE (TURP);  Surgeon: Ardis Hughs, MD;  Location: [facility];  Service: Urology;  Laterality: N/A;    FAMILY HISTORY: Family History  Problem Relation Age of Onset  . Alzheimer's disease Mother  Deceased  . Heart disease Father     Deceased    SOCIAL HISTORY: Social History   Social History  . Marital status: Married    Spouse name: [name]   . Number of children: 2  . Years of education: 12   Occupational History  .  Retired  . retired    Social History Main Topics  . Smoking status: Former Research scientist (life sciences)  . Smokeless tobacco: Former Systems developer    Quit date: 02/09/1974  . Alcohol use No  . Drug use: No  . Sexual activity: No   Other Topics Concern  . Not on file   Social History Narrative   Patient lives at home with wife [name].    Patient has 2 children.    Patient has a high school education.    Patient retired.    Patient is right handed.      PHYSICAL EXAM  Vitals:   02/07/16 1116  BP: 122/60  Pulse: 72  Resp: 16  Weight: 206 lb (93.4 kg)  Height: 6' (1.829 m)   Body mass index is 27.94 kg/m. Generalized: Well developed, in no acute distress   Neurological examination  Mentation: Alert oriented to time, place, history taking. Follows all commands speech and language fluent. [facility] 13/30 Last visit, MMSE now.  MMSE - Mini Mental State Exam 02/07/2016  Orientation to time 2  Orientation to Place 0  Registration 1  Attention/  Calculation 0  Recall 0  Language- name 2 objects 2  Language- repeat 1  Language- follow 3 step command 3  Language- read & follow direction 1  Write a sentence 1  Copy design 1  Total score 12    Cranial nerve:  Reports no change in taste and smell. Pupils were equal round reactive to light. Extraocular movements were full, visual field were full on confrontational test. Facial sensation and strength were normal. Uvula tongue midline. Head turning and shoulder shrug were normal and symmetric. Motor: The motor testing reveals 5 over 5 strength of all 4 extremities. Good symmetric motor tone is noted throughout.  Sensory: Sensory testing is intact to soft touch on all 4 extremities. No evidence of extinction is noted.  Coordination: Cerebellar testing reveals good finger-nose-finger   Gait and station: Patient able to stand from a sitting position without assistance.  He uses a 4 prong cane.  Gait is slightly wide based. Tandem gait not attempted.Ambulates with single-point cane Reflexes: Deep tendon reflexes are symmetrically attenuated  bilaterally.   DIAGNOSTIC DATA (LABS, IMAGING, TESTING)  He has extensive labs in hospital.  Status:  Final result Visible to patient:  No (Not Released) Next appt:  02/20/2016 at 11:15 AM in Cardiology (Sanda Klein, MD)  Newer results are available. Click to view them now.   Ref Range & Units 2wk ago  WBC 4.0 - 10.5 K/uL 12.2    RBC 4.22 - 5.81 MIL/uL 4.88   Hemoglobin 13.0 - 17.0 g/dL 15.0   HCT 39.0 - 52.0 % 43.9   MCV 78.0 - 100.0 fL 90.0   MCH 26.0 - 34.0 pg 30.7   MCHC 30.0 - 36.0 g/dL 34.2   RDW 11.5 - 15.5 % 13.5   Platelets 150 - 400 K/uL 250   Resulting Agency  [facility]    Specimen Collected: 01/24/16 04:04 Last Resulted: 01/24/16 05:15                      Encounter   View Encounter  Result Information   Flag: Abnormal Status: Final result (Collected: 01/24/2016 04:04) Provider Status: Ordered            ASSESSMENT AND PLAN  Mr Pfaff lives with his wife at [facility], and has meals prepared there. He was twice in hospital , the last time with a UTI in Jan 24, 2016 . He has mentally, physically and cognitively declined rapidly over the last 6-8 month.   82 y.o. year old male has a past medical history of Diabetes; Hypertension; Depression; Dementia in Alzheimer's disease; Hyperlipidemia; Episodic VTach, pacer; Urinary incontinence; Anemia (06/2012); Elevated PSA; Microalbuminuria; BPH (benign prostatic hyperplasia); Hiatal hernia (02/26/2010); Gastritis (02/26/2010); Diverticulosis (02/26/2010); Duodenitis (02/26/2010); Brady-tachy syndrome; Atrial fibrillation; CAD (coronary artery disease); and Fall (03/2014). here to follow up for his memory loss and restless legs syndrome. Discontinue Xanax, his wife has used his medication.  He sleeps too much anyway. He was unable to name if and where he has pain- he stated I don't know, some times. His opiates have been refilled this week, but the bottle was found empty this morning.  Continue Aricept at 23 mg daily, but it's effect is weaning.  Namenda  10mg  BID, the same .  Continue to take Mirapex for restless legs, still needs this medication.    Use cane at all times for safe ambulation at high risk for falls- he needs to move into assisted living, but his spouse needs to be  Helped - she is addicted to pain  killers.  Follow-up in 50months next with  Dennie Bible, [place].   Strongly  suggest assisted living for this patient .  [facility] [address] [place], [place] [address] [phone]  Dr Osborne Casco.

## 2016-02-07 NOTE — Patient Instructions (Signed)
Please see Dr Osborne Casco for further evaluation of home needs.  Given the degree of dementia- I am strongly in favor of moving to asissted living.

## 2016-02-12 ENCOUNTER — Inpatient Hospital Stay (HOSPITAL_COMMUNITY)
Admission: EM | Admit: 2016-02-12 | Discharge: 2016-02-16 | DRG: 152 | Disposition: A | Discharge status: Skilled Nursing Facility | Payer: Medicare Other | Attending: Family Medicine | Admitting: Family Medicine

## 2016-02-12 ENCOUNTER — Emergency Department (HOSPITAL_COMMUNITY): Payer: Medicare Other

## 2016-02-12 ENCOUNTER — Encounter (HOSPITAL_COMMUNITY): Payer: Self-pay

## 2016-02-12 DIAGNOSIS — Z950 Presence of cardiac pacemaker: Secondary | ICD-10-CM | POA: Diagnosis present

## 2016-02-12 DIAGNOSIS — I4891 Unspecified atrial fibrillation: Secondary | ICD-10-CM | POA: Diagnosis not present

## 2016-02-12 DIAGNOSIS — J111 Influenza due to unidentified influenza virus with other respiratory manifestations: Secondary | ICD-10-CM | POA: Diagnosis present

## 2016-02-12 DIAGNOSIS — N390 Urinary tract infection, site not specified: Secondary | ICD-10-CM | POA: Diagnosis present

## 2016-02-12 DIAGNOSIS — Z9049 Acquired absence of other specified parts of digestive tract: Secondary | ICD-10-CM

## 2016-02-12 DIAGNOSIS — R627 Adult failure to thrive: Secondary | ICD-10-CM | POA: Diagnosis present

## 2016-02-12 DIAGNOSIS — Z8249 Family history of ischemic heart disease and other diseases of the circulatory system: Secondary | ICD-10-CM

## 2016-02-12 DIAGNOSIS — Z8546 Personal history of malignant neoplasm of prostate: Secondary | ICD-10-CM

## 2016-02-12 DIAGNOSIS — E1159 Type 2 diabetes mellitus with other circulatory complications: Secondary | ICD-10-CM | POA: Diagnosis present

## 2016-02-12 DIAGNOSIS — K219 Gastro-esophageal reflux disease without esophagitis: Secondary | ICD-10-CM | POA: Diagnosis present

## 2016-02-12 DIAGNOSIS — J9601 Acute respiratory failure with hypoxia: Secondary | ICD-10-CM | POA: Diagnosis not present

## 2016-02-12 DIAGNOSIS — Z7982 Long term (current) use of aspirin: Secondary | ICD-10-CM

## 2016-02-12 DIAGNOSIS — G309 Alzheimer's disease, unspecified: Secondary | ICD-10-CM | POA: Diagnosis present

## 2016-02-12 DIAGNOSIS — I700 Atherosclerosis of aorta: Secondary | ICD-10-CM | POA: Diagnosis present

## 2016-02-12 DIAGNOSIS — Z66 Do not resuscitate: Secondary | ICD-10-CM | POA: Diagnosis present

## 2016-02-12 DIAGNOSIS — G2581 Restless legs syndrome: Secondary | ICD-10-CM | POA: Diagnosis present

## 2016-02-12 DIAGNOSIS — M6281 Muscle weakness (generalized): Secondary | ICD-10-CM

## 2016-02-12 DIAGNOSIS — Z820 Family history of epilepsy and other diseases of the nervous system: Secondary | ICD-10-CM

## 2016-02-12 DIAGNOSIS — J189 Pneumonia, unspecified organism: Secondary | ICD-10-CM

## 2016-02-12 DIAGNOSIS — F0280 Dementia in other diseases classified elsewhere without behavioral disturbance: Secondary | ICD-10-CM | POA: Diagnosis present

## 2016-02-12 DIAGNOSIS — Z79899 Other long term (current) drug therapy: Secondary | ICD-10-CM

## 2016-02-12 DIAGNOSIS — Z7984 Long term (current) use of oral hypoglycemic drugs: Secondary | ICD-10-CM

## 2016-02-12 DIAGNOSIS — I2510 Atherosclerotic heart disease of native coronary artery without angina pectoris: Secondary | ICD-10-CM | POA: Diagnosis present

## 2016-02-12 DIAGNOSIS — E785 Hyperlipidemia, unspecified: Secondary | ICD-10-CM | POA: Diagnosis present

## 2016-02-12 DIAGNOSIS — E1122 Type 2 diabetes mellitus with diabetic chronic kidney disease: Secondary | ICD-10-CM | POA: Diagnosis present

## 2016-02-12 DIAGNOSIS — R509 Fever, unspecified: Secondary | ICD-10-CM | POA: Diagnosis not present

## 2016-02-12 DIAGNOSIS — N181 Chronic kidney disease, stage 1: Secondary | ICD-10-CM | POA: Diagnosis present

## 2016-02-12 DIAGNOSIS — R4182 Altered mental status, unspecified: Secondary | ICD-10-CM

## 2016-02-12 DIAGNOSIS — R32 Unspecified urinary incontinence: Secondary | ICD-10-CM | POA: Diagnosis present

## 2016-02-12 DIAGNOSIS — Z880 Allergy status to penicillin: Secondary | ICD-10-CM

## 2016-02-12 DIAGNOSIS — IMO0001 Reserved for inherently not codable concepts without codable children: Secondary | ICD-10-CM

## 2016-02-12 DIAGNOSIS — D638 Anemia in other chronic diseases classified elsewhere: Secondary | ICD-10-CM | POA: Diagnosis present

## 2016-02-12 DIAGNOSIS — E1121 Type 2 diabetes mellitus with diabetic nephropathy: Secondary | ICD-10-CM | POA: Diagnosis present

## 2016-02-12 DIAGNOSIS — B961 Klebsiella pneumoniae [K. pneumoniae] as the cause of diseases classified elsewhere: Secondary | ICD-10-CM | POA: Diagnosis present

## 2016-02-12 DIAGNOSIS — Z87891 Personal history of nicotine dependence: Secondary | ICD-10-CM

## 2016-02-12 DIAGNOSIS — I10 Essential (primary) hypertension: Secondary | ICD-10-CM | POA: Diagnosis present

## 2016-02-12 DIAGNOSIS — J101 Influenza due to other identified influenza virus with other respiratory manifestations: Secondary | ICD-10-CM | POA: Diagnosis present

## 2016-02-12 DIAGNOSIS — Z531 Procedure and treatment not carried out because of patient's decision for reasons of belief and group pressure: Secondary | ICD-10-CM | POA: Diagnosis present

## 2016-02-12 DIAGNOSIS — I1310 Hypertensive heart and chronic kidney disease without heart failure, with stage 1 through stage 4 chronic kidney disease, or unspecified chronic kidney disease: Secondary | ICD-10-CM | POA: Diagnosis present

## 2016-02-12 LAB — I-STAT ARTERIAL BLOOD GAS, ED
Acid-base deficit: 2 mmol/L (ref 0.0–2.0)
BICARBONATE: 23.1 mmol/L (ref 20.0–28.0)
O2 SAT: 93 %
PCO2 ART: 41.4 mmHg (ref 32.0–48.0)
TCO2: 24 mmol/L (ref 0–100)
pH, Arterial: 7.359 (ref 7.350–7.450)
pO2, Arterial: 75 mmHg — ABNORMAL LOW (ref 83.0–108.0)

## 2016-02-12 LAB — PROTIME-INR
INR: 1.04
PROTHROMBIN TIME: 13.7 s (ref 11.4–15.2)

## 2016-02-12 LAB — URINALYSIS, ROUTINE W REFLEX MICROSCOPIC
Bacteria, UA: NONE SEEN
Bilirubin Urine: NEGATIVE
Glucose, UA: NEGATIVE mg/dL
Ketones, ur: NEGATIVE mg/dL
Leukocytes, UA: NEGATIVE
Nitrite: NEGATIVE
Protein, ur: 100 mg/dL — AB
Specific Gravity, Urine: 1.019 (ref 1.005–1.030)
Squamous Epithelial / HPF: NONE SEEN
pH: 6 (ref 5.0–8.0)

## 2016-02-12 LAB — CBC WITH DIFFERENTIAL/PLATELET
Basophils Absolute: 0.1 K/uL (ref 0.0–0.1)
Basophils Relative: 1 %
Eosinophils Absolute: 0 K/uL (ref 0.0–0.7)
Eosinophils Relative: 0 %
HCT: 37.4 % — ABNORMAL LOW (ref 39.0–52.0)
Hemoglobin: 12.7 g/dL — ABNORMAL LOW (ref 13.0–17.0)
Lymphocytes Relative: 5 %
Lymphs Abs: 0.5 K/uL — ABNORMAL LOW (ref 0.7–4.0)
MCH: 30.5 pg (ref 26.0–34.0)
MCHC: 34 g/dL (ref 30.0–36.0)
MCV: 89.9 fL (ref 78.0–100.0)
Monocytes Absolute: 1.2 K/uL — ABNORMAL HIGH (ref 0.1–1.0)
Monocytes Relative: 13 %
Neutro Abs: 7.3 K/uL (ref 1.7–7.7)
Neutrophils Relative %: 81 %
Platelets: ADEQUATE K/uL (ref 150–400)
RBC: 4.16 MIL/uL — ABNORMAL LOW (ref 4.22–5.81)
RDW: 13.8 % (ref 11.5–15.5)
WBC: 9.1 K/uL (ref 4.0–10.5)

## 2016-02-12 LAB — COMPREHENSIVE METABOLIC PANEL WITH GFR
ALT: 15 U/L — ABNORMAL LOW (ref 17–63)
AST: 24 U/L (ref 15–41)
Albumin: 3.4 g/dL — ABNORMAL LOW (ref 3.5–5.0)
Alkaline Phosphatase: 63 U/L (ref 38–126)
Anion gap: 14 (ref 5–15)
BUN: 23 mg/dL — ABNORMAL HIGH (ref 6–20)
CO2: 20 mmol/L — ABNORMAL LOW (ref 22–32)
Calcium: 8.7 mg/dL — ABNORMAL LOW (ref 8.9–10.3)
Chloride: 106 mmol/L (ref 101–111)
Creatinine, Ser: 1.61 mg/dL — ABNORMAL HIGH (ref 0.61–1.24)
GFR calc Af Amer: 43 mL/min — ABNORMAL LOW
GFR calc non Af Amer: 37 mL/min — ABNORMAL LOW
Glucose, Bld: 174 mg/dL — ABNORMAL HIGH (ref 65–99)
Potassium: 4 mmol/L (ref 3.5–5.1)
Sodium: 140 mmol/L (ref 135–145)
Total Bilirubin: 0.4 mg/dL (ref 0.3–1.2)
Total Protein: 6.7 g/dL (ref 6.5–8.1)

## 2016-02-12 LAB — I-STAT CG4 LACTIC ACID, ED: Lactic Acid, Venous: 2.1 mmol/L (ref 0.5–1.9)

## 2016-02-12 MED ORDER — VANCOMYCIN HCL IN DEXTROSE 1-5 GM/200ML-% IV SOLN
1000.0000 mg | Freq: Once | INTRAVENOUS | Status: AC
  Administered 2016-02-12: 1000 mg via INTRAVENOUS
  Filled 2016-02-12: qty 200

## 2016-02-12 MED ORDER — SODIUM CHLORIDE 0.9 % IV BOLUS (SEPSIS)
1000.0000 mL | Freq: Once | INTRAVENOUS | Status: AC
  Administered 2016-02-13: 1000 mL via INTRAVENOUS

## 2016-02-12 MED ORDER — SODIUM CHLORIDE 0.9 % IV BOLUS (SEPSIS)
1000.0000 mL | Freq: Once | INTRAVENOUS | Status: AC
  Administered 2016-02-12: 1000 mL via INTRAVENOUS

## 2016-02-12 MED ORDER — LEVOFLOXACIN IN D5W 750 MG/150ML IV SOLN
750.0000 mg | Freq: Once | INTRAVENOUS | Status: AC
  Administered 2016-02-13: 750 mg via INTRAVENOUS
  Filled 2016-02-12: qty 150

## 2016-02-12 MED ORDER — DEXTROSE 5 % IV SOLN
2.0000 g | Freq: Once | INTRAVENOUS | Status: AC
  Administered 2016-02-12: 2 g via INTRAVENOUS
  Filled 2016-02-12: qty 2

## 2016-02-12 NOTE — ED Triage Notes (Signed)
Patient comes by EMS for a fever that was 106 for EMS.  Gave 1000mg  tylenol.  Patient is arousalable but lethargic. Vitals stable.  100 rectal temp on arrival.

## 2016-02-12 NOTE — ED Provider Notes (Signed)
[facility] DEPT Provider Note   CSN: [id number] Arrival date & time: 02/12/16  2135   History   Chief Complaint Chief Complaint  Patient presents with  . Fever  . Altered Mental Status    HPI [name] is a 83 y.o. male.  HPI   LEVEL V CAVEAT AMS Jaydrian E Burnettis a 83 y.o.malewith medical history significant of A-fib, tachy brady syndrome with pacemaker, Alzheimer's dementia, HTN, DM who is sent to the ER from his assisted living facility for being more altered than normal. He has dementia at baseline. The patient is here with no notes from facility and no family members. He is non verbal- unclear if this is baseline. He is lethargic. Per EMS he had a temp of 106, given 1000mg  of Tylenol en route. No other HP or ROS  Available to provider on initial examination.  Past Medical History:  Diagnosis Date  . Anemia 06/2012  . Atrial fibrillation ([place])   . BPH (benign prostatic hyperplasia)    Followed by Dr. Matilde Sprang  . Brady-tachy syndrome ([place])    04/17/2009 MDT pacer Enrhythmatr fib  . CAD (coronary artery disease)   . Dementia in Alzheimer's disease   . Depression   . Diabetes ([place])   . Diverticulosis 02/26/2010   Sigmoid colon  . Duodenitis 02/26/2010  . Elevated PSA   . Episodic VTach, pacer    Followed by Dr. Avon Gully  . Fall 03/2014   elbow fluid   . Gastritis 02/26/2010   moderate  . Hiatal hernia 02/26/2010  . Hyperlipidemia   . Hypertension   . Memory loss   . Microalbuminuria   . Urinary incontinence     Patient Active Problem List   Diagnosis Date Noted  . Refusal of blood transfusions as patient is Jehovah's Witness 10/28/2015  . Hypokalemia 10/27/2015  . UTI (urinary tract infection) 10/21/2015  . Urinary retention 10/21/2015  . Sigmoid diverticulitis 10/17/2015  . Dementia 10/17/2015  . ARF (acute renal failure) ([place]) 10/17/2015  . Diarrhea 10/17/2015  . AKI (acute kidney injury) ([place])   . Cerebral degeneration, unspecified  05/26/2013  . Restless legs syndrome (RLS) 05/26/2013  . CAD (coronary artery disease) 07/31/2012  . Pacemaker 07/31/2012  . Nonsustained ventricular tachycardia ([place]) 07/31/2012  . Aortic atherosclerosis ([place]) 07/31/2012  . Microcytic anemia 06/29/2012  . Long term (current) use of anticoagulants 04/26/2012  . THYROID NODULE 01/23/2010  . Type 2 diabetes mellitus with vascular disease ([place]) 01/23/2010  . Hyperlipidemia 01/23/2010  . ANEMIA, IRON DEFICIENCY 01/23/2010  . Memory loss 01/23/2010  . Essential hypertension 01/23/2010  . Atrial fibrillation ([place]) 01/23/2010  . CVA 01/23/2010  . ESOPHAGEAL STRICTURE 01/23/2010  . HIATAL HERNIA 01/23/2010  . DIVERTICULOSIS, COLON 01/23/2010  . HEMOCCULT POSITIVE STOOL 01/23/2010    Past Surgical History:  Procedure Laterality Date  . APPENDECTOMY  1948  . CARDIAC CATHETERIZATION  07/07/2007   mild to mod. ca+ w/narrowing of 20% ostium of second diagonal,diffuse 80% stenosis small caliber infer. branch,20% mid LAD,60-70% narrowing mid atrioventricular groove CX,mild 20-30% RCA  . CAROTID DOPPLER  10/01/2006   0-49% right bulb,prox,ECA & prox ICA,0-49% left subclavian bulb & prox ICA,right & left ICA abn. resistance  . Cataracts    . CHOLECYSTECTOMY    . CIRCUMCISION N/A 10/28/2015   Procedure: CIRCUMCISION ADULT, DORSAL SLIT;  Surgeon: Ardis Hughs, MD;  Location: [facility];  Service: Urology;  Laterality: N/A;  . NM MYOVIEW LTD  10/01/2010   No ischemia  .  PERMANENT PACEMAKER INSERTION  04/17/2009   MDT Enrhythm  . TRANSURETHRAL RESECTION OF PROSTATE N/A 10/28/2015   Procedure: TRANSURETHRAL RESECTION OF THE PROSTATE (TURP);  Surgeon: Ardis Hughs, MD;  Location: [facility];  Service: Urology;  Laterality: N/A;     Home Medications    Prior to Admission medications   Medication Sig Start Date End Date Taking? Authorizing Provider  ALPRAZolam (XANAX) 0.25 MG tablet Take 0.25 mg by mouth 2 (two) times daily as needed for anxiety.      Historical Provider, MD  amLODipine (NORVASC) 5 MG tablet Take 5 mg by mouth daily.     Historical Provider, MD  aspirin 81 MG tablet Take 81 mg by mouth daily.    Historical Provider, MD  atorvastatin (LIPITOR) 20 MG tablet Take 20 mg by mouth every evening.     Historical Provider, MD  benzonatate (TESSALON) 100 MG capsule Take 100 mg by mouth 2 (two) times daily as needed for cough.    Historical Provider, MD  donepezil (ARICEPT) 23 MG TABS tablet Take 1 tablet (23 mg total) by mouth at bedtime. 02/07/16   Larey Seat, MD  ferrous sulfate 325 (65 FE) MG tablet Take 325 mg by mouth 2 (two) times daily.    Historical Provider, MD  glipiZIDE (GLUCOTROL) 10 MG tablet Take 10 mg by mouth daily. 04/21/14   Historical Provider, MD  isosorbide mononitrate (IMDUR) 30 MG 24 hr tablet Take 30 mg by mouth daily.    Historical Provider, MD  memantine (NAMENDA) 10 MG tablet Take 1 tablet (10 mg total) by mouth 2 (two) times daily. 02/07/16   Asencion Partridge Dohmeier, MD  metoprolol succinate (TOPROL-XL) 25 MG 24 hr tablet Take 0.5 tablets (12.5 mg total) by mouth daily. Take with or immediately following a meal. 11/13/15   Mihai Croitoru, MD  pantoprazole (PROTONIX) 40 MG tablet Take 40 mg by mouth daily.    Historical Provider, MD  pramipexole (MIRAPEX) 0.125 MG tablet Take 2 tablets (0.25 mg total) by mouth every evening. Two hours before bedtime 06/29/14   Dennie Bible, NP  sertraline (ZOLOFT) 50 MG tablet Take 1 tablet (50 mg total) by mouth daily. 02/07/16   Larey Seat, MD    Family History Family History  Problem Relation Age of Onset  . Alzheimer's disease Mother     Deceased  . Heart disease Father     Deceased    Social History Social History  Substance Use Topics  . Smoking status: Former Research scientist (life sciences)  . Smokeless tobacco: Former Systems developer    Quit date: 02/09/1974  . Alcohol use No     Allergies   Penicillins   Review of Systems Review of Systems   LEVEL V CAVEAT AMS  Physical  Exam Updated Vital Signs BP (!) 129/50   Pulse 63   Temp 100.2 F (37.9 C) (Rectal)   Resp 20   SpO2 95%   Physical Exam  Constitutional: He appears well-developed and well-nourished. No distress.  HENT:  Head: Normocephalic and atraumatic.  Eyes: Conjunctivae and EOM are normal. Pupils are equal, round, and reactive to light. No scleral icterus.  Neck: Normal range of motion. Neck supple.  Cardiovascular: Normal rate, regular rhythm, normal heart sounds and intact distal pulses.   Pulmonary/Chest: Effort normal. No respiratory distress. He has no wheezes. He has no rales. He exhibits no tenderness.  Abdominal: Soft. There is no tenderness.  Musculoskeletal: He exhibits no edema or tenderness.  Neurological: He is alert. He displays  a negative Romberg sign.  No facial paralysis, non verbal, lethargic, unable to asses cranial nerves d/t pts inability to follow commands.   Skin: Skin is warm and dry. No petechiae, no purpura and no rash noted. He is not diaphoretic.  Psychiatric: His speech is not slurred. Cognition and memory are impaired.  Nursing note and vitals reviewed.   ED Treatments / Results  Labs (all labs ordered are listed, but only abnormal results are displayed) Labs Reviewed  COMPREHENSIVE METABOLIC PANEL - Abnormal; Notable for the following:       Result Value   CO2 20 (*)    Glucose, Bld 174 (*)    BUN 23 (*)    Creatinine, Ser 1.61 (*)    Calcium 8.7 (*)    Albumin 3.4 (*)    ALT 15 (*)    GFR calc non Af Amer 37 (*)    GFR calc Af Amer 43 (*)    All other components within normal limits  CBC WITH DIFFERENTIAL/PLATELET - Abnormal; Notable for the following:    RBC 4.16 (*)    Hemoglobin 12.7 (*)    HCT 37.4 (*)    Lymphs Abs 0.5 (*)    Monocytes Absolute 1.2 (*)    All other components within normal limits  URINALYSIS, ROUTINE W REFLEX MICROSCOPIC - Abnormal; Notable for the following:    Hgb urine dipstick SMALL (*)    Protein, ur 100 (*)    All  other components within normal limits  I-STAT CG4 LACTIC ACID, ED - Abnormal; Notable for the following:    Lactic Acid, Venous 2.10 (*)    All other components within normal limits  I-STAT ARTERIAL BLOOD GAS, ED - Abnormal; Notable for the following:    pO2, Arterial 75.0 (*)    All other components within normal limits  CULTURE, BLOOD (ROUTINE X 2)  CULTURE, BLOOD (ROUTINE X 2)  URINE CULTURE  CULTURE, BLOOD (ROUTINE X 2)  CULTURE, BLOOD (ROUTINE X 2)  PROTIME-INR  INFLUENZA PANEL BY PCR (TYPE A & B, H1N1)  I-STAT CG4 LACTIC ACID, ED    EKG  EKG Interpretation None       Radiology Dg Chest Portable 1 View  Result Date: 02/12/2016 CLINICAL DATA:  Altered mental status, fever EXAM: PORTABLE CHEST 1 VIEW COMPARISON:  01/23/2016 FINDINGS: Left-sided pacing device with leads over the right atrium and right ventricle as before. Minimal subsegmental atelectasis left lung base. No acute consolidation or effusion. Stable borderline to mild cardiomegaly. No pneumothorax. IMPRESSION: 1. Minimal subsegmental atelectasis left base.  No acute infiltrate. 2. Stable borderline to mild cardiomegaly Electronically Signed   By: Donavan Foil M.D.   On: 02/12/2016 22:57    Procedures Procedures (including critical care time)  Medications Ordered in ED Medications  levofloxacin (LEVAQUIN) IVPB 750 mg (750 mg Intravenous New Bag/Given 02/13/16 0028)  sodium chloride 0.9 % bolus 1,000 mL (1,000 mLs Intravenous New Bag/Given 02/12/16 2304)  aztreonam (AZACTAM) 2 g in dextrose 5 % 50 mL IVPB (2 g Intravenous New Bag/Given 02/12/16 2354)  vancomycin (VANCOCIN) IVPB 1000 mg/200 mL premix (1,000 mg Intravenous New Bag/Given 02/12/16 2359)  sodium chloride 0.9 % bolus 1,000 mL (1,000 mLs Intravenous New Bag/Given 02/13/16 0035)     Initial Impression / Assessment and Plan / ED Course  I have reviewed the triage vital signs and the nursing notes.  Pertinent labs & imaging results that were available during my  care of the patient were reviewed by me  and considered in my medical decision making (see chart for details).  Clinical Course     CRITICAL CARE Performed by: Linus Mako Total critical care time: 35 minutes Critical care time was exclusive of separately billable procedures and treating other patients. Critical care was necessary to treat or prevent imminent or life-threatening deterioration. Critical care was time spent personally by me on the following activities: development of treatment plan with patient and/or surrogate as well as nursing, discussions with consultants, evaluation of patient's response to treatment, examination of patient, obtaining history from patient or surrogate, ordering and performing treatments and interventions, ordering and review of laboratory studies, ordering and review of radiographic studies, pulse oximetry and re-evaluation of patient's condition.  Pt with reported fever and lethargic with worsening confusion brought to ED from assisted living. He has been improving with fluids in the ED, Dr. Laneta Simmers has seen patient as well - recommends admission for observation as no source of infection has been found. -- Per Dr. Laneta Simmers no meningeal signs and no neurological deficits.   Discussed case with Dr. Hal Hope who has agreed to admit patient, Head CT added on per his request.   Final Clinical Impressions(s) / ED Diagnoses   Final diagnoses:  Altered mental status, unspecified altered mental status type    New Prescriptions New Prescriptions   No medications on file     Delos Haring, PA-C 02/13/16 0148    Leo Grosser, MD 02/13/16 [phone]

## 2016-02-13 ENCOUNTER — Observation Stay (HOSPITAL_COMMUNITY): Payer: Medicare Other

## 2016-02-13 ENCOUNTER — Encounter (HOSPITAL_COMMUNITY): Payer: Self-pay | Admitting: Internal Medicine

## 2016-02-13 DIAGNOSIS — D638 Anemia in other chronic diseases classified elsewhere: Secondary | ICD-10-CM | POA: Diagnosis present

## 2016-02-13 DIAGNOSIS — R32 Unspecified urinary incontinence: Secondary | ICD-10-CM | POA: Diagnosis present

## 2016-02-13 DIAGNOSIS — E1159 Type 2 diabetes mellitus with other circulatory complications: Secondary | ICD-10-CM | POA: Diagnosis not present

## 2016-02-13 DIAGNOSIS — J9601 Acute respiratory failure with hypoxia: Secondary | ICD-10-CM

## 2016-02-13 DIAGNOSIS — R0902 Hypoxemia: Secondary | ICD-10-CM | POA: Diagnosis not present

## 2016-02-13 DIAGNOSIS — E1122 Type 2 diabetes mellitus with diabetic chronic kidney disease: Secondary | ICD-10-CM | POA: Diagnosis present

## 2016-02-13 DIAGNOSIS — Z66 Do not resuscitate: Secondary | ICD-10-CM | POA: Diagnosis present

## 2016-02-13 DIAGNOSIS — N390 Urinary tract infection, site not specified: Secondary | ICD-10-CM | POA: Diagnosis present

## 2016-02-13 DIAGNOSIS — G2581 Restless legs syndrome: Secondary | ICD-10-CM | POA: Diagnosis present

## 2016-02-13 DIAGNOSIS — Z531 Procedure and treatment not carried out because of patient's decision for reasons of belief and group pressure: Secondary | ICD-10-CM | POA: Diagnosis present

## 2016-02-13 DIAGNOSIS — I10 Essential (primary) hypertension: Secondary | ICD-10-CM | POA: Diagnosis not present

## 2016-02-13 DIAGNOSIS — G309 Alzheimer's disease, unspecified: Secondary | ICD-10-CM | POA: Diagnosis present

## 2016-02-13 DIAGNOSIS — J101 Influenza due to other identified influenza virus with other respiratory manifestations: Secondary | ICD-10-CM | POA: Diagnosis present

## 2016-02-13 DIAGNOSIS — B961 Klebsiella pneumoniae [K. pneumoniae] as the cause of diseases classified elsewhere: Secondary | ICD-10-CM | POA: Diagnosis present

## 2016-02-13 DIAGNOSIS — Z7982 Long term (current) use of aspirin: Secondary | ICD-10-CM | POA: Diagnosis not present

## 2016-02-13 DIAGNOSIS — J111 Influenza due to unidentified influenza virus with other respiratory manifestations: Secondary | ICD-10-CM | POA: Diagnosis present

## 2016-02-13 DIAGNOSIS — E785 Hyperlipidemia, unspecified: Secondary | ICD-10-CM | POA: Diagnosis present

## 2016-02-13 DIAGNOSIS — Z79899 Other long term (current) drug therapy: Secondary | ICD-10-CM | POA: Diagnosis not present

## 2016-02-13 DIAGNOSIS — Z9049 Acquired absence of other specified parts of digestive tract: Secondary | ICD-10-CM | POA: Diagnosis not present

## 2016-02-13 DIAGNOSIS — R4182 Altered mental status, unspecified: Secondary | ICD-10-CM | POA: Insufficient documentation

## 2016-02-13 DIAGNOSIS — I2510 Atherosclerotic heart disease of native coronary artery without angina pectoris: Secondary | ICD-10-CM | POA: Diagnosis present

## 2016-02-13 DIAGNOSIS — I4891 Unspecified atrial fibrillation: Secondary | ICD-10-CM | POA: Diagnosis present

## 2016-02-13 DIAGNOSIS — I1310 Hypertensive heart and chronic kidney disease without heart failure, with stage 1 through stage 4 chronic kidney disease, or unspecified chronic kidney disease: Secondary | ICD-10-CM | POA: Diagnosis present

## 2016-02-13 DIAGNOSIS — R05 Cough: Secondary | ICD-10-CM | POA: Diagnosis not present

## 2016-02-13 DIAGNOSIS — F0280 Dementia in other diseases classified elsewhere without behavioral disturbance: Secondary | ICD-10-CM | POA: Diagnosis present

## 2016-02-13 DIAGNOSIS — J9600 Acute respiratory failure, unspecified whether with hypoxia or hypercapnia: Secondary | ICD-10-CM | POA: Diagnosis not present

## 2016-02-13 DIAGNOSIS — Z87891 Personal history of nicotine dependence: Secondary | ICD-10-CM | POA: Diagnosis not present

## 2016-02-13 DIAGNOSIS — N181 Chronic kidney disease, stage 1: Secondary | ICD-10-CM | POA: Diagnosis present

## 2016-02-13 DIAGNOSIS — Z7984 Long term (current) use of oral hypoglycemic drugs: Secondary | ICD-10-CM | POA: Diagnosis not present

## 2016-02-13 DIAGNOSIS — R531 Weakness: Secondary | ICD-10-CM | POA: Diagnosis not present

## 2016-02-13 DIAGNOSIS — E1121 Type 2 diabetes mellitus with diabetic nephropathy: Secondary | ICD-10-CM | POA: Diagnosis present

## 2016-02-13 DIAGNOSIS — Z950 Presence of cardiac pacemaker: Secondary | ICD-10-CM | POA: Diagnosis not present

## 2016-02-13 LAB — STREP PNEUMONIAE URINARY ANTIGEN: Strep Pneumo Urinary Antigen: NEGATIVE

## 2016-02-13 LAB — COMPREHENSIVE METABOLIC PANEL
ALT: 17 U/L (ref 17–63)
AST: 25 U/L (ref 15–41)
Albumin: 3.3 g/dL — ABNORMAL LOW (ref 3.5–5.0)
Alkaline Phosphatase: 56 U/L (ref 38–126)
Anion gap: 11 (ref 5–15)
BUN: 22 mg/dL — ABNORMAL HIGH (ref 6–20)
CHLORIDE: 106 mmol/L (ref 101–111)
CO2: 22 mmol/L (ref 22–32)
Calcium: 8 mg/dL — ABNORMAL LOW (ref 8.9–10.3)
Creatinine, Ser: 1.61 mg/dL — ABNORMAL HIGH (ref 0.61–1.24)
GFR, EST AFRICAN AMERICAN: 43 mL/min — AB (ref >60)
GFR, EST NON AFRICAN AMERICAN: 37 mL/min — AB (ref >60)
Glucose, Bld: 178 mg/dL — ABNORMAL HIGH (ref 65–99)
POTASSIUM: 3.7 mmol/L (ref 3.5–5.1)
Sodium: 139 mmol/L (ref 135–145)
Total Bilirubin: 0.5 mg/dL (ref 0.3–1.2)
Total Protein: 6.4 g/dL — ABNORMAL LOW (ref 6.5–8.1)

## 2016-02-13 LAB — CBC WITH DIFFERENTIAL/PLATELET
Basophils Absolute: 0 10*3/uL (ref 0.0–0.1)
Basophils Relative: 0 %
Eosinophils Absolute: 0 10*3/uL (ref 0.0–0.7)
Eosinophils Relative: 0 %
HCT: 37.2 % — ABNORMAL LOW (ref 39.0–52.0)
HEMOGLOBIN: 12.2 g/dL — AB (ref 13.0–17.0)
LYMPHS ABS: 1 10*3/uL (ref 0.7–4.0)
LYMPHS PCT: 10 %
MCH: 30.2 pg (ref 26.0–34.0)
MCHC: 32.8 g/dL (ref 30.0–36.0)
MCV: 92.1 fL (ref 78.0–100.0)
Monocytes Absolute: 0.6 10*3/uL (ref 0.1–1.0)
Monocytes Relative: 6 %
NEUTROS PCT: 84 %
Neutro Abs: 8.2 10*3/uL — ABNORMAL HIGH (ref 1.7–7.7)
Platelets: 166 10*3/uL (ref 150–400)
RBC: 4.04 MIL/uL — AB (ref 4.22–5.81)
RDW: 14.1 % (ref 11.5–15.5)
WBC: 9.8 10*3/uL (ref 4.0–10.5)

## 2016-02-13 LAB — INFLUENZA PANEL BY PCR (TYPE A & B)
INFLAPCR: POSITIVE — AB
INFLBPCR: NEGATIVE

## 2016-02-13 LAB — CBG MONITORING, ED: GLUCOSE-CAPILLARY: 295 mg/dL — AB (ref 65–99)

## 2016-02-13 LAB — I-STAT CG4 LACTIC ACID, ED: LACTIC ACID, VENOUS: 1.39 mmol/L (ref 0.5–1.9)

## 2016-02-13 MED ORDER — ATORVASTATIN CALCIUM 10 MG PO TABS
20.0000 mg | ORAL_TABLET | Freq: Every evening | ORAL | Status: DC
  Administered 2016-02-13 – 2016-02-16 (×4): 20 mg via ORAL
  Filled 2016-02-13 (×2): qty 1
  Filled 2016-02-13 (×2): qty 2

## 2016-02-13 MED ORDER — ENOXAPARIN SODIUM 40 MG/0.4ML ~~LOC~~ SOLN
40.0000 mg | SUBCUTANEOUS | Status: DC
  Administered 2016-02-14 – 2016-02-16 (×3): 40 mg via SUBCUTANEOUS
  Filled 2016-02-13 (×4): qty 0.4

## 2016-02-13 MED ORDER — ONDANSETRON HCL 4 MG/2ML IJ SOLN: 4.0000 mg | Freq: Four times a day (QID) | INTRAMUSCULAR | Status: DC | PRN

## 2016-02-13 MED ORDER — ISOSORBIDE MONONITRATE ER 30 MG PO TB24
30.0000 mg | ORAL_TABLET | Freq: Every day | ORAL | Status: DC
  Administered 2016-02-13 – 2016-02-16 (×4): 30 mg via ORAL
  Filled 2016-02-13 (×4): qty 1

## 2016-02-13 MED ORDER — DEXTROSE 5 % IV SOLN
1.0000 g | Freq: Three times a day (TID) | INTRAVENOUS | Status: DC
  Administered 2016-02-13: 1 g via INTRAVENOUS
  Filled 2016-02-13 (×6): qty 1

## 2016-02-13 MED ORDER — AMLODIPINE BESYLATE 5 MG PO TABS
5.0000 mg | ORAL_TABLET | Freq: Every day | ORAL | Status: DC
  Administered 2016-02-13 – 2016-02-16 (×4): 5 mg via ORAL
  Filled 2016-02-13 (×4): qty 1

## 2016-02-13 MED ORDER — ALBUTEROL SULFATE (2.5 MG/3ML) 0.083% IN NEBU
5.0000 mg | INHALATION_SOLUTION | Freq: Once | RESPIRATORY_TRACT | Status: AC
  Administered 2016-02-13: 5 mg via RESPIRATORY_TRACT
  Filled 2016-02-13: qty 6

## 2016-02-13 MED ORDER — PRAMIPEXOLE DIHYDROCHLORIDE 0.25 MG PO TABS
0.2500 mg | ORAL_TABLET | Freq: Every evening | ORAL | Status: DC
  Administered 2016-02-13 – 2016-02-16 (×4): 0.25 mg via ORAL
  Filled 2016-02-13 (×4): qty 1

## 2016-02-13 MED ORDER — MEMANTINE HCL 10 MG PO TABS
10.0000 mg | ORAL_TABLET | Freq: Two times a day (BID) | ORAL | Status: DC
  Administered 2016-02-13 – 2016-02-16 (×7): 10 mg via ORAL
  Filled 2016-02-13 (×8): qty 1

## 2016-02-13 MED ORDER — ACETAMINOPHEN 650 MG RE SUPP
650.0000 mg | Freq: Four times a day (QID) | RECTAL | Status: DC | PRN
Start: 2016-02-13 — End: 2016-02-16

## 2016-02-13 MED ORDER — OSELTAMIVIR PHOSPHATE 30 MG PO CAPS
30.0000 mg | ORAL_CAPSULE | Freq: Two times a day (BID) | ORAL | Status: DC
  Administered 2016-02-13 – 2016-02-16 (×7): 30 mg via ORAL
  Filled 2016-02-13 (×8): qty 1

## 2016-02-13 MED ORDER — METOPROLOL SUCCINATE ER 25 MG PO TB24
12.5000 mg | ORAL_TABLET | Freq: Every day | ORAL | Status: DC
  Administered 2016-02-14 – 2016-02-16 (×3): 12.5 mg via ORAL
  Filled 2016-02-13 (×5): qty 1

## 2016-02-13 MED ORDER — FERROUS SULFATE 325 (65 FE) MG PO TABS
325.0000 mg | ORAL_TABLET | Freq: Two times a day (BID) | ORAL | Status: DC
  Administered 2016-02-13 – 2016-02-16 (×7): 325 mg via ORAL
  Filled 2016-02-13 (×7): qty 1

## 2016-02-13 MED ORDER — ASPIRIN 81 MG PO CHEW
81.0000 mg | CHEWABLE_TABLET | Freq: Every day | ORAL | Status: DC
  Administered 2016-02-13 – 2016-02-16 (×4): 81 mg via ORAL
  Filled 2016-02-13 (×4): qty 1

## 2016-02-13 MED ORDER — PANTOPRAZOLE SODIUM 40 MG PO TBEC
40.0000 mg | DELAYED_RELEASE_TABLET | Freq: Every day | ORAL | Status: DC
  Administered 2016-02-13 – 2016-02-16 (×4): 40 mg via ORAL
  Filled 2016-02-13 (×4): qty 1

## 2016-02-13 MED ORDER — IPRATROPIUM-ALBUTEROL 0.5-2.5 (3) MG/3ML IN SOLN
3.0000 mL | Freq: Four times a day (QID) | RESPIRATORY_TRACT | Status: DC
  Administered 2016-02-13 (×2): 3 mL via RESPIRATORY_TRACT
  Filled 2016-02-13 (×3): qty 3

## 2016-02-13 MED ORDER — SERTRALINE HCL 50 MG PO TABS
50.0000 mg | ORAL_TABLET | Freq: Every day | ORAL | Status: DC
  Administered 2016-02-13 – 2016-02-16 (×4): 50 mg via ORAL
  Filled 2016-02-13 (×4): qty 1

## 2016-02-13 MED ORDER — VANCOMYCIN HCL 10 G IV SOLR
1250.0000 mg | INTRAVENOUS | Status: DC
  Administered 2016-02-13: 1250 mg via INTRAVENOUS
  Filled 2016-02-13 (×3): qty 1250

## 2016-02-13 MED ORDER — IPRATROPIUM-ALBUTEROL 0.5-2.5 (3) MG/3ML IN SOLN
3.0000 mL | Freq: Two times a day (BID) | RESPIRATORY_TRACT | Status: DC
Start: 2016-02-14 — End: 2016-02-16
  Administered 2016-02-15 – 2016-02-16 (×4): 3 mL via RESPIRATORY_TRACT
  Filled 2016-02-13 (×6): qty 3

## 2016-02-13 MED ORDER — METHYLPREDNISOLONE SODIUM SUCC 125 MG IJ SOLR
125.0000 mg | Freq: Once | INTRAMUSCULAR | Status: AC
Start: 2016-02-13 — End: 2016-02-13
  Administered 2016-02-13: 125 mg via INTRAVENOUS
  Filled 2016-02-13: qty 2

## 2016-02-13 MED ORDER — ACETAMINOPHEN 325 MG PO TABS
650.0000 mg | ORAL_TABLET | Freq: Four times a day (QID) | ORAL | Status: DC | PRN
  Administered 2016-02-13: 650 mg via ORAL
  Filled 2016-02-13: qty 2

## 2016-02-13 MED ORDER — BENZONATATE 100 MG PO CAPS
100.0000 mg | ORAL_CAPSULE | Freq: Two times a day (BID) | ORAL | Status: DC | PRN
  Administered 2016-02-15: 100 mg via ORAL
  Filled 2016-02-13 (×2): qty 1

## 2016-02-13 MED ORDER — INSULIN ASPART 100 UNIT/ML ~~LOC~~ SOLN
0.0000 [IU] | Freq: Three times a day (TID) | SUBCUTANEOUS | Status: DC
  Administered 2016-02-13: 5 [IU] via SUBCUTANEOUS
  Administered 2016-02-15 – 2016-02-16 (×2): 1 [IU] via SUBCUTANEOUS
  Filled 2016-02-13: qty 1

## 2016-02-13 MED ORDER — DONEPEZIL HCL 23 MG PO TABS
23.0000 mg | ORAL_TABLET | Freq: Every day | ORAL | Status: DC
  Administered 2016-02-13 – 2016-02-15 (×3): 23 mg via ORAL
  Filled 2016-02-13 (×4): qty 1

## 2016-02-13 MED ORDER — ALPRAZOLAM 0.25 MG PO TABS
0.2500 mg | ORAL_TABLET | Freq: Two times a day (BID) | ORAL | Status: DC | PRN
  Administered 2016-02-13 – 2016-02-15 (×3): 0.25 mg via ORAL
  Filled 2016-02-13 (×3): qty 1

## 2016-02-13 MED ORDER — DEXTROSE 5 % IV SOLN
1.0000 g | INTRAVENOUS | Status: DC
  Administered 2016-02-13: 1 g via INTRAVENOUS
  Filled 2016-02-13 (×2): qty 10

## 2016-02-13 MED ORDER — ONDANSETRON HCL 4 MG PO TABS: 4.0000 mg | ORAL_TABLET | Freq: Four times a day (QID) | ORAL | Status: DC | PRN

## 2016-02-13 NOTE — Progress Notes (Signed)
Patient admitted after midnight, please see H&P.  Flu A + and tamiflu started.  Patient's status changed to inpatient as based on Flu results, frail condition and coarse lung sounds suspect patient will be here > than 2 midnights.  Will continue IV abx for now and repeat x ray in AM.  From ALF.  Eulogio Bear DO

## 2016-02-13 NOTE — Progress Notes (Signed)
Pharmacy Antibiotic Note  Jon Davis is a 83 y.o. male admitted from SNF on 02/12/2016 with pneumonia.  Pharmacy has been consulted for Vancomycin dosing x 8 days. Pt also on Aztreonam.  Vanc 1gm and Aztreonam 2gm given in ED ~midnight  Plan: Vancomycin 1250mg  IV q24h x 8 days Will f/u micro data, renal function, and pt's clinical condition Vanc trough prn      Temp (24hrs), Avg:100.2 F (37.9 C), Min:100.2 F (37.9 C), Max:100.2 F (37.9 C)   Recent Labs Lab 02/12/16 2155 02/12/16 2212 02/13/16 0242  WBC 9.1  --   --   CREATININE 1.61*  --   --   LATICACIDVEN  --  2.10* 1.39    Estimated Creatinine Clearance: 38.4 mL/min (by C-G formula based on SCr of 1.61 mg/dL (H)).    Allergies  Allergen Reactions  . Penicillins Rash    Has patient had a PCN reaction causing immediate rash, facial/tongue/throat swelling, SOB or lightheadedness with hypotension: No  Has patient had a PCN reaction causing severe rash involving mucus membranes or skin necrosis: unknown Has patient had a PCN reaction that required hospitalization: No  Has patient had a PCN reaction occurring within the last 10 years: No  If all of the above answers are "NO", then may proceed with Cephalosporin use.    Antimicrobials this admission: 1/3 Vanc >>  1/3 Aztreonam >>   Dose adjustments this admission: n/a  Microbiology results: 1/2 BCx x2:  1/2 UCx:    Thank you for allowing pharmacy to be a part of this patient's care.  Sherlon Handing, PharmD, BCPS Clinical pharmacist, pager [phone] 02/13/2016 3:15 AM

## 2016-02-13 NOTE — ED Notes (Signed)
[facility] to find out what the pt baseline is.  Receptionist informed me it is independent living and they do not keep those records.  Was given the phone number of pt's son [phone] Jon Davis.  Number did not work for this Therapist, sports.
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
## 2016-02-13 NOTE — Progress Notes (Signed)
CSW engaged with Patient's caregiver, Wendall Stade, [phone], in [place] office. Per Mrs. Arlyce Dice, she is unable to provide her services any longer due to Patient and spouse's need for higher level of care and due to liability concerns. Patient's caregiver reports that Patient's APS worker is Ernest Pine [phone]. CSW contacted Mrs. Joya Gaskins and left HIPAA complaint voice message requesting return phone call as soon as possible.    Lorrine Kin, MSW, LCSW [facility] ED/72M Clinical Social Worker [phone]

## 2016-02-13 NOTE — ED Notes (Addendum)
Bag of Azactam broke while attempting to administer, new requested from pharmacy. Wrong Metoprolol sent by Pharmacy, this is requested.

## 2016-02-13 NOTE — ED Notes (Signed)
Pt from [facility].  It has no medical staff.

## 2016-02-13 NOTE — ED Notes (Signed)
Pt's wife just told this RN pt has alzheimer's.

## 2016-02-13 NOTE — ED Notes (Addendum)
Placed a condom cath on the pt because pt is receiving 2L of fluids and sleeping.  Before the condom cath was placed on the pt I noticed the penis was red and had some skin breakdown.  This was mentioned to me in report from his previous Nurse Nurse, adult. Pt is still confused, he is able to tell me his name and birthday but does not know where he is, what month or year it is, or who is the president. [employer] is 16.

## 2016-02-13 NOTE — H&P (Signed)
History and Physical    Jon Davis [id number] DOB: September 30, 1926 DOA: 02/12/2016  PCP: Haywood Pao, MD  Patient coming from: [facility] facility.  Chief Complaint: Fever and confusion.  HPI: Jon Davis is a 83 y.o. male with history of dementia, CAD, tachybradycardia syndrome status post pacemaker placement, A. fib who was recently admitted for failure to thrive was brought to the ER after patient was found to have fever and increasing confusion. In the ER patient was complaining of shortness of breath and has been having productive cough. Chest x-ray shows atelectasis. Patient's symptoms are concerning for developing pneumonia and admitted for further management. Patient also was given nebulizer treatment along with steroids since patient was wheezing. CT head was unremarkable.   ED Course: Patient was placed on empiric antibiotics nebulizer and steroids. CT head was unremarkable.  Review of Systems: As per HPI, rest all negative.   Past Medical History:  Diagnosis Date  . Anemia 06/2012  . Atrial fibrillation ([place])   . BPH (benign prostatic hyperplasia)    Followed by Dr. Matilde Sprang  . Brady-tachy syndrome ([place])    04/17/2009 MDT pacer Enrhythmatr fib  . CAD (coronary artery disease)   . Dementia in Alzheimer's disease   . Depression   . Diabetes ([place])   . Diverticulosis 02/26/2010   Sigmoid colon  . Duodenitis 02/26/2010  . Elevated PSA   . Episodic VTach, pacer    Followed by Dr. Avon Gully  . Fall 03/2014   elbow fluid   . Gastritis 02/26/2010   moderate  . Hiatal hernia 02/26/2010  . Hyperlipidemia   . Hypertension   . Memory loss   . Microalbuminuria   . Urinary incontinence     Past Surgical History:  Procedure Laterality Date  . APPENDECTOMY  1948  . CARDIAC CATHETERIZATION  07/07/2007   mild to mod. ca+ w/narrowing of 20% ostium of second diagonal,diffuse 80% stenosis small caliber infer. branch,20% mid LAD,60-70% narrowing mid  atrioventricular groove CX,mild 20-30% RCA  . CAROTID DOPPLER  10/01/2006   0-49% right bulb,prox,ECA & prox ICA,0-49% left subclavian bulb & prox ICA,right & left ICA abn. resistance  . Cataracts    . CHOLECYSTECTOMY    . CIRCUMCISION N/A 10/28/2015   Procedure: CIRCUMCISION ADULT, DORSAL SLIT;  Surgeon: Ardis Hughs, MD;  Location: [facility];  Service: Urology;  Laterality: N/A;  . NM MYOVIEW LTD  10/01/2010   No ischemia  . PERMANENT PACEMAKER INSERTION  04/17/2009   MDT Enrhythm  . TRANSURETHRAL RESECTION OF PROSTATE N/A 10/28/2015   Procedure: TRANSURETHRAL RESECTION OF THE PROSTATE (TURP);  Surgeon: Ardis Hughs, MD;  Location: [facility];  Service: Urology;  Laterality: N/A;     reports that he has quit smoking. He quit smokeless tobacco use about 42 years ago. He reports that he does not drink alcohol or use drugs.  Allergies  Allergen Reactions  . Penicillins Rash    Has patient had a PCN reaction causing immediate rash, facial/tongue/throat swelling, SOB or lightheadedness with hypotension: No  Has patient had a PCN reaction causing severe rash involving mucus membranes or skin necrosis: unknown Has patient had a PCN reaction that required hospitalization: No  Has patient had a PCN reaction occurring within the last 10 years: No  If all of the above answers are "NO", then may proceed with Cephalosporin use.    Family History  Problem Relation Age of Onset  . Alzheimer's disease Mother     Deceased  .  Heart disease Father     Deceased    Prior to Admission medications   Medication Sig Start Date End Date Taking? Authorizing Provider  ALPRAZolam (XANAX) 0.25 MG tablet Take 0.25 mg by mouth 2 (two) times daily as needed for anxiety.     Historical Provider, MD  amLODipine (NORVASC) 5 MG tablet Take 5 mg by mouth daily.     Historical Provider, MD  aspirin 81 MG tablet Take 81 mg by mouth daily.    Historical Provider, MD  atorvastatin (LIPITOR) 20 MG tablet Take 20 mg by  mouth every evening.     Historical Provider, MD  benzonatate (TESSALON) 100 MG capsule Take 100 mg by mouth 2 (two) times daily as needed for cough.    Historical Provider, MD  donepezil (ARICEPT) 23 MG TABS tablet Take 1 tablet (23 mg total) by mouth at bedtime. 02/07/16   Larey Seat, MD  ferrous sulfate 325 (65 FE) MG tablet Take 325 mg by mouth 2 (two) times daily.    Historical Provider, MD  glipiZIDE (GLUCOTROL) 10 MG tablet Take 10 mg by mouth daily. 04/21/14   Historical Provider, MD  isosorbide mononitrate (IMDUR) 30 MG 24 hr tablet Take 30 mg by mouth daily.    Historical Provider, MD  memantine (NAMENDA) 10 MG tablet Take 1 tablet (10 mg total) by mouth 2 (two) times daily. 02/07/16   Asencion Partridge Dohmeier, MD  metoprolol succinate (TOPROL-XL) 25 MG 24 hr tablet Take 0.5 tablets (12.5 mg total) by mouth daily. Take with or immediately following a meal. 11/13/15   Mihai Croitoru, MD  pantoprazole (PROTONIX) 40 MG tablet Take 40 mg by mouth daily.    Historical Provider, MD  pramipexole (MIRAPEX) 0.125 MG tablet Take 2 tablets (0.25 mg total) by mouth every evening. Two hours before bedtime 06/29/14   Dennie Bible, NP  sertraline (ZOLOFT) 50 MG tablet Take 1 tablet (50 mg total) by mouth daily. 02/07/16   Larey Seat, MD    Physical Exam: Vitals:   02/13/16 0000 02/13/16 0015 02/13/16 0115 02/13/16 0200  BP: 128/78 (!) 129/50 140/69 139/61  Pulse: 65 63 60 66  Resp: 23 20 21  (!) 28  Temp:      TempSrc:      SpO2: 96% 95% 97% 96%      Constitutional: Moderately built and nourished. Vitals:   02/13/16 0000 02/13/16 0015 02/13/16 0115 02/13/16 0200  BP: 128/78 (!) 129/50 140/69 139/61  Pulse: 65 63 60 66  Resp: 23 20 21  (!) 28  Temp:      TempSrc:      SpO2: 96% 95% 97% 96%   Eyes: Anicteric no pallor. ENMT: No discharge from the ears eyes nose and mouth. Neck: No mass felt. No neck rigidity. Respiratory: Mild wheezing. No crepitation. Cardiovascular: S1 and S2  heard. Abdomen: Soft nontender bowel sounds present. Musculoskeletal: No edema. No joint effusion. Skin: No rash. Skin appears warm. Neurologic: Alert awake oriented to his name. Moves all extremities. Psychiatric: Patient has dementia.   Labs on Admission: I have personally reviewed following labs and imaging studies  CBC:  Recent Labs Lab 02/12/16 2155  WBC 9.1  NEUTROABS 7.3  HGB 12.7*  HCT 37.4*  MCV 89.9  PLT PLATELET CLUMPS NOTED ON SMEAR, COUNT APPEARS ADEQUATE   Basic Metabolic Panel:  Recent Labs Lab 02/12/16 2155  NA 140  K 4.0  CL 106  CO2 20*  GLUCOSE 174*  BUN 23*  CREATININE 1.61*  CALCIUM  8.7*   GFR: Estimated Creatinine Clearance: 38.4 mL/min (by C-G formula based on SCr of 1.61 mg/dL (H)). Liver Function Tests:  Recent Labs Lab 02/12/16 2155  AST 24  ALT 15*  ALKPHOS 63  BILITOT 0.4  PROT 6.7  ALBUMIN 3.4*   No results for input(s): LIPASE, AMYLASE in the last 168 hours. No results for input(s): AMMONIA in the last 168 hours. Coagulation Profile:  Recent Labs Lab 02/12/16 2155  INR 1.04   Cardiac Enzymes: No results for input(s): CKTOTAL, CKMB, CKMBINDEX, TROPONINI in the last 168 hours. BNP (last 3 results) No results for input(s): PROBNP in the last 8760 hours. HbA1C: No results for input(s): HGBA1C in the last 72 hours. CBG: No results for input(s): GLUCAP in the last 168 hours. Lipid Profile: No results for input(s): CHOL, HDL, LDLCALC, TRIG, CHOLHDL, LDLDIRECT in the last 72 hours. Thyroid Function Tests: No results for input(s): TSH, T4TOTAL, FREET4, T3FREE, THYROIDAB in the last 72 hours. Anemia Panel: No results for input(s): VITAMINB12, FOLATE, FERRITIN, TIBC, IRON, RETICCTPCT in the last 72 hours. Urine analysis:    Component Value Date/Time   COLORURINE YELLOW 02/12/2016 2220   APPEARANCEUR CLEAR 02/12/2016 2220   LABSPEC 1.019 02/12/2016 2220   PHURINE 6.0 02/12/2016 2220   GLUCOSEU NEGATIVE 02/12/2016 2220     HGBUR SMALL (A) 02/12/2016 2220   BILIRUBINUR NEGATIVE 02/12/2016 2220   BILIRUBINUR small 08/19/2012 1241   KETONESUR NEGATIVE 02/12/2016 2220   PROTEINUR 100 (A) 02/12/2016 2220   UROBILINOGEN 0.2 08/19/2012 1241   UROBILINOGEN 0.2 03/20/2009 0645   NITRITE NEGATIVE 02/12/2016 2220   LEUKOCYTESUR NEGATIVE 02/12/2016 2220   Sepsis Labs: @LABRCNTIP (procalcitonin:4,lacticidven:4) )No results found for this or any previous visit (from the past 240 hour(s)).   Radiological Exams on Admission: Ct Head Wo Contrast  Result Date: 02/13/2016 CLINICAL DATA:  Altered mental status. History of hypertension, diabetes, Alzheimer's disease. EXAM: CT HEAD WITHOUT CONTRAST TECHNIQUE: Contiguous axial images were obtained from the base of the skull through the vertex without intravenous contrast. COMPARISON:  CT HEAD October 17, 2015 FINDINGS: BRAIN: Moderate to severe ventriculomegaly with disproportionate temporal horn enlargement compatible with temporal atrophy. Confluent supratentorial white matter hypodensities. Old bilateral basal ganglia lacunar infarcts. No intraparenchymal hemorrhage, mass effect, midline shift or acute large vascular territory infarcts. Basal cisterns are patent. VASCULAR: Moderate calcific atherosclerosis of the carotid siphons. SKULL: No skull fracture. No significant scalp soft tissue swelling. SINUSES/ORBITS: Mild paranasal sinus mucosal thickening. Mastoid air cells are well aerated. Soft tissue within LEFT external auditory canal compatible with cerumen. Status post bilateral ocular lens implants. The included ocular globes and orbital contents are non-suspicious. OTHER: None. IMPRESSION: No acute intracranial process. Moderate to severe brain atrophy, with disproportionate temporal lobe volume loss associated with neurodegenerative disorders. Severe chronic small vessel ischemic disease. Old basal ganglia infarcts. Electronically Signed   By: Elon Alas M.D.   On:  02/13/2016 02:26   Dg Chest Portable 1 View  Result Date: 02/12/2016 CLINICAL DATA:  Altered mental status, fever EXAM: PORTABLE CHEST 1 VIEW COMPARISON:  01/23/2016 FINDINGS: Left-sided pacing device with leads over the right atrium and right ventricle as before. Minimal subsegmental atelectasis left lung base. No acute consolidation or effusion. Stable borderline to mild cardiomegaly. No pneumothorax. IMPRESSION: 1. Minimal subsegmental atelectasis left base.  No acute infiltrate. 2. Stable borderline to mild cardiomegaly Electronically Signed   By: Donavan Foil M.D.   On: 02/12/2016 22:57     Assessment/Plan Principal Problem:   Acute  respiratory failure with hypoxia (HCC) Active Problems:   Type 2 diabetes mellitus with vascular disease (HCC)   Essential hypertension   Atrial fibrillation (HCC)   CAD (coronary artery disease)   Pacemaker   Restless legs syndrome (RLS)   Refusal of blood transfusions as patient is Jehovah's Witness    1. Acute respiratory failure with hypoxia probably from developing pneumonia or bronchitis - check influenza PCR. Continue with vancomycin and aztreonam and Levaquin since patient was recently admitted 2 weeks ago. Follow cultures. Continue nebulizer treatment and if patient is still wheezing continue steroids. Patient did receive 1 dose of steroid in the ER. 2. Diabetes mellitus type 2 - patient will be on sliding scale coverage. 3. History of atrial fibrillation - on metoprolol and aspirin. Was felt to be not a candidate for anticoagulation. Chads 2 vasc score is 5. 4. CAD - on Imdur statins and beta blockers and aspirin. 5. Hypertension - continue present medications. 6. Dementia on Aricept and Namenda. 7. History of tachybradycardia syndrome status post pacemaker placement.   DVT prophylaxis: Lovenox. Code Status: DO NOT RESUSCITATE.  Family Communication: [name].  Disposition Plan: Back to facility.  Consults called: None.  Admission  status: Inpatient.    Rise Patience MD [facility] Pager [phone].  If 7PM-7AM, please contact night-coverage [email or web address] Password [email or web address]  02/13/2016, 3:10 AM

## 2016-02-13 NOTE — Care Management Note (Signed)
Case Management Note  Patient Details  Name: Jon Davis MRN: [id number] Date of Birth: 12/23/1926  Subjective/Objective:                  From [place]; has private caregiver. /83 y.o. male with history of dementia, CAD, tachybradycardia syndrome status post pacemaker placement, A. fib who was recently admitted for failure to thrive was brought to the ER after patient was found to have fever and increasing confusion. In the ER patient was complaining of shortness of breath and has been having productive cough.  Action/Plan: Follow for disposition needs. /Admit to INPATIENT; discharge home with home health vs SNF vs ALF.   Expected Discharge Date:  02/16/16               Expected Discharge Plan:  Assisted Living / Rest Home  In-House Referral:  Clinical Social Work  Discharge planning Services  CM Consult  Post Acute Care Choice:  NA Choice offered to:  NA  DME Arranged:  N/A DME Agency:  NA  HH Arranged:  NA HH Agency:  NA  Status of Service:  In process, will continue to follow  If discussed at Long Length of Stay Meetings, dates discussed:    Additional Comments: EDSW and EDCSW engaged with pt private caregiver, Wendall Stade, [phone], in ED office. Per Mrs. Arlyce Dice, she is unable to provide her services any longer due to pt and spouse's need for higher level of care and due to liability concerns. Pt caregiver reports that pt APS worker is Ernest [facility] [phone].  Fuller Mandril, RN 02/13/2016, 11:00 AM

## 2016-02-13 NOTE — ED Notes (Addendum)
Patient and patient's wife's caregiver to nurse's station to discuss  patient's home situation. States she is concerned pt's wife can no longer care for patient. Case Management and Social work made aware of concerns and will follow up.

## 2016-02-14 ENCOUNTER — Inpatient Hospital Stay (HOSPITAL_COMMUNITY): Payer: Medicare Other

## 2016-02-14 LAB — GLUCOSE, CAPILLARY
GLUCOSE-CAPILLARY: 108 mg/dL — AB (ref 65–99)
GLUCOSE-CAPILLARY: 107 mg/dL — AB (ref 65–99)
Glucose-Capillary: 120 mg/dL — ABNORMAL HIGH (ref 65–99)
Glucose-Capillary: 108 mg/dL — ABNORMAL HIGH (ref 65–99)

## 2016-02-14 LAB — URINE CULTURE: CULTURE: NO GROWTH

## 2016-02-14 LAB — LEGIONELLA PNEUMOPHILA SEROGP 1 UR AG: L. pneumophila Serogp 1 Ur Ag: NEGATIVE

## 2016-02-14 NOTE — Progress Notes (Signed)
PROGRESS NOTE    Jon Davis  [id number] DOB: 10-22-1926 DOA: 02/12/2016 PCP: Haywood Pao, MD    Brief Narrative:   83 y/o ? Afib, Tachy-Brady-+ PPM/[employer] 2011  -CHad2Vasc2 score=5, no on AC 2/2 anemia-on ASA [place] plaque in desc thor aorta by TEE Klebsiella UTI Alzh Dementia +Restless leg syndrome  -? TIA in 2009 Ty II DM htn CAD-cath 06/2007 60 % circ disease Prostate CA  -Prior episodic urinary retention  Assessment & Plan:   Principal Problem:   Acute respiratory failure with hypoxia (HCC) Active Problems:   Type 2 diabetes mellitus with vascular disease (HCC)   Essential hypertension   Atrial fibrillation (HCC)   CAD (coronary artery disease)   Pacemaker   Restless legs syndrome (RLS)   Refusal of blood transfusions as patient is Jehovah's Witness   Influenza A   Flu   Flu positive-however patient seems to be at baseline.  Dg 2 vw neg for PNA.  Will continue only Tamiflu for now and follow patient in a.m. discontinue vancomycin and ceftriaxone-can give Tessalon Perles and albuterol nebulizations. Tachybradycardia syndrome, Medtronic PPM, A. fib [place] score above 6, no anticoagulation-all clinically stable at present Into new metoprolol XL 125, hold diuretics as euvolemic-Okay to discontinue cardiac monitor Dementia + restless leg syndrome continue Mirapex 0.125 daily, Aricept 23 milligrams daily at bedtime-may use Xanax 0.25 twice a day 4 anxiety or agitation and dementia Chronic kidney disease stage I-Baseline creatinine is 1.1 to 1.5. Currently is 1.6. Monitor in a.m. 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 if all stable  Consultants:   None  Procedures:   None  Antimicrobials:   Tamiflu only currently    Subjective:  Looks well. Not on oxygen. Seems alert and little bit disoriented but is overall much improved from prior   Objective: Vitals:   02/13/16 1035 02/13/16 1604 02/13/16 1936 02/14/16 0500  BP: 127/61 137/85 134/77 129/70  Pulse: 81 79 77 87  Resp: 20 20 20 19   Temp:  98.1 F (36.7 C) 99.3 F (37.4 C) 99.1 F (37.3 C)  TempSrc:  Oral Oral Oral  SpO2: 98% 96% 99% 99%    Intake/Output Summary (Last 24 hours) at 02/14/16 1222 Last data filed at 02/14/16 0900  Gross per 24 hour  Intake             1000 ml  Output                0 ml  Net             1000 ml   There were no vitals filed for this visit.  Examination:  General exam: Appears calm and comfortable , mild confusion Respiratory system: Clear to auscultation. Respiratory effort normal. Cardiovascular system: S1 & S2 heard, RRR. No JVD. No pedal edema. Gastrointestinal system: Abdomen is nondistended, soft and nontender. No organomegaly. bowel sounds heard. Central nervous system: Alert and oriented. No focal neurological deficits. Extremities: Symmetric 5 x 5 power. Skin: No rashes, lesions or ulcers Psychiatry: Judgement and insight appear normal. Mood & affect appropriate.     Data Reviewed: I have personally reviewed following labs and imaging  studies  CBC:  Recent Labs Lab 02/12/16 2155 02/13/16 0423  WBC 9.1 9.8  NEUTROABS 7.3 8.2*  HGB 12.7* 12.2*  HCT 37.4* 37.2*  MCV 89.9 92.1  PLT PLATELET CLUMPS NOTED ON SMEAR, COUNT APPEARS ADEQUATE [id number]   Basic Metabolic Panel:  Recent Labs Lab 02/12/16 2155 02/13/16 0423  NA 140 139  K 4.0 3.7  CL 106 106  CO2 20* 22  GLUCOSE 174* 178*  BUN 23* 22*  CREATININE 1.61* 1.61*  CALCIUM 8.7* 8.0*   GFR: Estimated Creatinine Clearance: 38.4 mL/min (by C-G  formula based on SCr of 1.61 mg/dL (H)). Liver Function Tests:  Recent Labs Lab 02/12/16 2155 02/13/16 0423  AST 24 25  ALT 15* 17  ALKPHOS 63 56  BILITOT 0.4 0.5  PROT 6.7 6.4*  ALBUMIN 3.4* 3.3*   No results for input(s): LIPASE, AMYLASE in the last 168 hours. No results for input(s): AMMONIA in the last 168 hours. Coagulation Profile:  Recent Labs Lab 02/12/16 2155  INR 1.04   Cardiac Enzymes: No results for input(s): CKTOTAL, CKMB, CKMBINDEX, TROPONINI in the last 168 hours. BNP (last 3 results) No results for input(s): PROBNP in the last 8760 hours. HbA1C: No results for input(s): HGBA1C in the last 72 hours. CBG:  Recent Labs Lab 02/13/16 1237 02/14/16 0853 02/14/16 1206  GLUCAP 295* 120* 108*   Lipid Profile: No results for input(s): CHOL, HDL, LDLCALC, TRIG, CHOLHDL, LDLDIRECT in the last 72 hours. Thyroid Function Tests: No results for input(s): TSH, T4TOTAL, FREET4, T3FREE, THYROIDAB in the last 72 hours. Anemia Panel: No results for input(s): VITAMINB12, FOLATE, FERRITIN, TIBC, IRON, RETICCTPCT in the last 72 hours. Sepsis Labs:  Recent Labs Lab 02/12/16 2212 02/13/16 0242  LATICACIDVEN 2.10* 1.39    Recent Results (from the past 240 hour(s))  Culture, blood (Routine x 2)     Status: None (Preliminary result)   Collection Time: 02/12/16  9:55 PM  Result Value Ref Range Status   Specimen Description BLOOD LEFT ARM  Final   Special Requests BOTTLES DRAWN AEROBIC AND ANAEROBIC 5ML  Final   Culture NO GROWTH < 24 HOURS  Final   Report Status PENDING  Incomplete  Urine culture     Status: None   Collection Time: 02/12/16 10:20 PM  Result Value Ref Range Status   Specimen Description URINE, CATHETERIZED  Final   Special Requests NONE  Final   Culture NO GROWTH  Final   Report Status 02/14/2016 FINAL  Final         Radiology Studies: Dg Chest 2 View  Result Date: 02/14/2016 CLINICAL DATA:  Cough, history of diabetes and dementia,  evaluate for possible pneumonia EXAM: CHEST  2 VIEW COMPARISON:  Chest x-ray of 02/12/2016 FINDINGS: No pneumonia is seen and there is no evidence of pleural effusion. There is some peribronchial thickening present which could indicate bronchitis. Mediastinal and hilar contours are unremarkable. Cardiomegaly is stable and a dual lead permanent pacemaker remains. There are degenerative changes throughout the thoracic spine. IMPRESSION: 1. No definite pneumonia.  Question bronchitis. 2. Stable cardiomegaly with pacemaker. Electronically Signed   By: Ivar Drape M.D.   On: 02/14/2016 08:27   Ct Head Wo Contrast  Result Date: 02/13/2016 CLINICAL DATA:  Altered mental status. History of hypertension, diabetes, Alzheimer's disease. EXAM: CT HEAD WITHOUT CONTRAST TECHNIQUE: Contiguous axial images were obtained from the base of the skull through the vertex without intravenous contrast. COMPARISON:  CT HEAD October 17, 2015 FINDINGS: BRAIN: Moderate  to severe ventriculomegaly with disproportionate temporal horn enlargement compatible with temporal atrophy. Confluent supratentorial white matter hypodensities. Old bilateral basal ganglia lacunar infarcts. No intraparenchymal hemorrhage, mass effect, midline shift or acute large vascular territory infarcts. Basal cisterns are patent. VASCULAR: Moderate calcific atherosclerosis of the carotid siphons. SKULL: No skull fracture. No significant scalp soft tissue swelling. SINUSES/ORBITS: Mild paranasal sinus mucosal thickening. Mastoid air cells are well aerated. Soft tissue within LEFT external auditory canal compatible with cerumen. Status post bilateral ocular lens implants. The included ocular globes and orbital contents are non-suspicious. OTHER: None. IMPRESSION: No acute intracranial process. Moderate to severe brain atrophy, with disproportionate temporal lobe volume loss associated with neurodegenerative disorders. Severe chronic small vessel ischemic disease. Old  basal ganglia infarcts. Electronically Signed   By: Elon Alas M.D.   On: 02/13/2016 02:26   Dg Chest Portable 1 View  Result Date: 02/12/2016 CLINICAL DATA:  Altered mental status, fever EXAM: PORTABLE CHEST 1 VIEW COMPARISON:  01/23/2016 FINDINGS: Left-sided pacing device with leads over the right atrium and right ventricle as before. Minimal subsegmental atelectasis left lung base. No acute consolidation or effusion. Stable borderline to mild cardiomegaly. No pneumothorax. IMPRESSION: 1. Minimal subsegmental atelectasis left base.  No acute infiltrate. 2. Stable borderline to mild cardiomegaly Electronically Signed   By: Donavan Foil M.D.   On: 02/12/2016 22:57        Scheduled Meds: . amLODipine  5 mg Oral Daily  . aspirin  81 mg Oral Daily  . atorvastatin  20 mg Oral QPM  . cefTRIAXone (ROCEPHIN)  IV  1 g Intravenous Q24H  . donepezil  23 mg Oral QHS  . enoxaparin (LOVENOX) injection  40 mg Subcutaneous Q24H  . ferrous sulfate  325 mg Oral BID  . insulin aspart  0-9 Units Subcutaneous TID WC  . ipratropium-albuterol  3 mL Nebulization BID  . isosorbide mononitrate  30 mg Oral Daily  . memantine  10 mg Oral BID  . metoprolol succinate  12.5 mg Oral Daily  . oseltamivir  30 mg Oral BID  . pantoprazole  40 mg Oral Daily  . pramipexole  0.25 mg Oral QPM  . sertraline  50 mg Oral Daily  . vancomycin  1,250 mg Intravenous Q24H   Continuous Infusions:   LOS: 1 day    Time spent: Hopkins, Cecil, MD [facility] Pager [phone]  If 7PM-7AM, please contact night-coverage [email or web address] Password [email or web address] 02/14/2016, 12:22 PM

## 2016-02-14 NOTE — Care Management Note (Signed)
Case Management Note  Patient Details  Name: Jon Davis MRN: [id number] Date of Birth: Jan 02, 1927  Subjective/Objective:                    Action/Plan:  Will await PT/OT evals Expected Discharge Date:  02/16/16               Expected Discharge Plan:  Assisted Living / Rest Home  In-House Referral:  Clinical Social Work  Discharge planning Services  CM Consult  Post Acute Care Choice:  NA Choice offered to:  NA  DME Arranged:  N/A DME Agency:  NA  HH Arranged:  NA HH Agency:  NA  Status of Service:  In process, will continue to follow  If discussed at Long Length of Stay Meetings, dates discussed:    Additional Comments:  Marilu Favre, RN 02/14/2016, 10:27 AM

## 2016-02-15 LAB — BASIC METABOLIC PANEL
Anion gap: 10 (ref 5–15)
BUN: 26 mg/dL — AB (ref 6–20)
CHLORIDE: 106 mmol/L (ref 101–111)
CO2: 23 mmol/L (ref 22–32)
CREATININE: 1.27 mg/dL — AB (ref 0.61–1.24)
Calcium: 8.4 mg/dL — ABNORMAL LOW (ref 8.9–10.3)
GFR calc Af Amer: 57 mL/min — ABNORMAL LOW (ref >60)
GFR calc non Af Amer: 49 mL/min — ABNORMAL LOW (ref >60)
Glucose, Bld: 101 mg/dL — ABNORMAL HIGH (ref 65–99)
Potassium: 3.4 mmol/L — ABNORMAL LOW (ref 3.5–5.1)
SODIUM: 139 mmol/L (ref 135–145)

## 2016-02-15 LAB — GLUCOSE, CAPILLARY
GLUCOSE-CAPILLARY: 102 mg/dL — AB (ref 65–99)
GLUCOSE-CAPILLARY: 96 mg/dL (ref 65–99)
Glucose-Capillary: 130 mg/dL — ABNORMAL HIGH (ref 65–99)
Glucose-Capillary: 121 mg/dL — ABNORMAL HIGH (ref 65–99)

## 2016-02-15 LAB — CBC WITH DIFFERENTIAL/PLATELET
Basophils Absolute: 0 10*3/uL (ref 0.0–0.1)
Basophils Relative: 0 %
EOS ABS: 0 10*3/uL (ref 0.0–0.7)
Eosinophils Relative: 0 %
HCT: 37.9 % — ABNORMAL LOW (ref 39.0–52.0)
Hemoglobin: 12.5 g/dL — ABNORMAL LOW (ref 13.0–17.0)
LYMPHS ABS: 1.5 10*3/uL (ref 0.7–4.0)
Lymphocytes Relative: 21 %
MCH: 29.7 pg (ref 26.0–34.0)
MCHC: 33 g/dL (ref 30.0–36.0)
MCV: 90 fL (ref 78.0–100.0)
MONO ABS: 0.8 10*3/uL (ref 0.1–1.0)
MONOS PCT: 12 %
Neutro Abs: 4.5 10*3/uL (ref 1.7–7.7)
Neutrophils Relative %: 67 %
Platelets: 171 10*3/uL (ref 150–400)
RBC: 4.21 MIL/uL — ABNORMAL LOW (ref 4.22–5.81)
RDW: 13.8 % (ref 11.5–15.5)
WBC: 6.9 10*3/uL (ref 4.0–10.5)

## 2016-02-15 MED ORDER — OSELTAMIVIR PHOSPHATE 30 MG PO CAPS: 30.0000 mg | ORAL_CAPSULE | Freq: Two times a day (BID) | ORAL | 0 refills | Status: DC

## 2016-02-15 NOTE — Care Management Note (Signed)
Case Management Note  Patient Details  Name: Jon Davis MRN: [id number] Date of Birth: 09/21/1926  Subjective/Objective:                    Action/Plan: Pt discharged but unable to d/c to SNF d/t less than 3 night stay under Medicare. Pt without family local except for wife that is in the hospital. Pts caregiver quit when he entered the hospital and he is being followed by APS. Pt has dementia and is not safe to be home alone.  Pts grandson Jon Davis notified of discharge but he is unable to come from [place] to stay with his grandfather. He is working on getting him into an ALF near [place] or in a SNF with his Grandmother. CSW is working with Jon Davis and has talked with him multiple times today to assist with placement.  CM spoke with Jon Davis and offered private duty sitters for home and [facility] services. Jon Davis stated he had called multiple Private duty sitter companies and was unable to get all the services needed (ie: medication administration). CM informed him that the patient is discharged and they may be responsible for part of the hospital bill. Jon Davis voiced understanding. Jon Davis asked if patient could d/c and stay in the room with his wife. CM informed him that the hospital could not be responsible for him once discharged. CM continuing to follow. CSW to f/u with Jon Davis this pm for placement tomorrow.   Expected Discharge Date:  02/16/16               Expected Discharge Plan:  Assisted Living / Rest Home  In-House Referral:  Clinical Social Work  Discharge planning Services  CM Consult  Post Acute Care Choice:  NA Choice offered to:  NA  DME Arranged:  N/A DME Agency:  NA  HH Arranged:  NA HH Agency:  NA  Status of Service:  In process, will continue to follow  If discussed at Long Length of Stay Meetings, dates discussed:    Additional Comments:  Jon Friar, RN 02/15/2016, 8:42 PM

## 2016-02-15 NOTE — Evaluation (Signed)
Physical Therapy Evaluation Patient Details Name: Jon Davis MRN: [id number] DOB: Sep 28, 1926 Today's Date: 02/15/2016   History of Present Illness  Jon Davis is a 83 y.o. male admitted from SNF on 02/12/2016 with pneumonia, hx of Alzheimers  Clinical Impression  Patient presents with decreased balance, safety awareness, generalized weakness and cognitive deficits all limiting independence and safety with mobility.  He will benefit from skilled PT in the acute setting prior to d/c to SNF rehab for further skilled therapy and nursing care.    Follow Up Recommendations SNF;Supervision/Assistance - 24 hour    Equipment Recommendations  None recommended by PT    Recommendations for Other Services       Precautions / Restrictions Precautions Precautions: Fall      Mobility  Bed Mobility Overal bed mobility: Needs Assistance Bed Mobility: Supine to Sit     Supine to sit: Supervision     General bed mobility comments: increased time and use of rail, cues for scooting to EOB  Transfers Overall transfer level: Needs assistance   Transfers: Sit to/from Stand;Stand Pivot Transfers Sit to Stand: Min assist         General transfer comment: assist for balance, safety  Ambulation/Gait Ambulation/Gait assistance: Min guard Ambulation Distance (Feet): 120 Feet Assistive device: None Gait Pattern/deviations: Step-through pattern;Drifts right/left;Staggering left;Shuffle;Decreased stride length     General Gait Details: ambulated without device due to pt reports not using one at home, noted some staggering and veering in hallway with minguard for safety  Stairs Stairs: Yes Stairs assistance: Min assist Stair Management: Two rails;Alternating pattern Number of Stairs: 2 General stair comments: unsafe placement of foot halfway on stair, high fall risk and poor safety awareness  Wheelchair Mobility    Modified Rankin (Stroke Patients Only)       Balance  Overall balance assessment: Needs assistance   Sitting balance-Leahy Scale: Fair       Standing balance-Leahy Scale: Fair Standing balance comment: balances without UE support, but unsteady esp with mobility                             Pertinent Vitals/Pain Pain Assessment: No/denies pain    Home Living Family/patient expects to be discharged to:: Private residence     Type of Home: Independent living facility [facility] per chart)       Home Layout: One level   Additional Comments: unsure of PLOF-no family present during session. Pt unable to provide answers due to Poor memory    Prior Function Level of Independence: Needs assistance   Gait / Transfers Assistance Needed: reports not using assistive devices at home  ADL's / Homemaking Assistance Needed: pt states that wif ehelps him with selfcare  Comments: unsure of PLOF-no family present during session. Pt unable to provide answers due to Poor memory     Hand Dominance   Dominant Hand: Right    Extremity/Trunk Assessment   Upper Extremity Assessment Upper Extremity Assessment: Defer to OT evaluation    Lower Extremity Assessment Lower Extremity Assessment: Generalized weakness       Communication   Communication: No difficulties  Cognition Arousal/Alertness: Awake/alert Behavior During Therapy: Flat affect Overall Cognitive Status: No family/caregiver present to determine baseline cognitive functioning Area of Impairment: Orientation;Memory;Following commands;Safety/judgement;Awareness Orientation Level: Disoriented to;Place;Time;Situation   Memory: Decreased short-term memory Following Commands: Follows one step commands consistently Safety/Judgement: Decreased awareness of deficits     General Comments: repeatedly asking if  his family is here    General Comments      Exercises     Assessment/Plan    PT Assessment Patient needs continued PT services  PT Problem List  Decreased mobility;Decreased balance;Decreased safety awareness;Decreased knowledge of use of DME;Decreased strength          PT Treatment Interventions Therapeutic activities;Gait training;Therapeutic exercise;Patient/family education;Functional mobility training;Balance training    PT Goals (Current goals can be found in the Care Plan section)  Acute Rehab PT Goals Patient Stated Goal: "see my wife" PT Goal Formulation: Patient unable to participate in goal setting Time For Goal Achievement: 02/22/16 Potential to Achieve Goals: Fair    Frequency Min 3X/week   Barriers to discharge Decreased caregiver support      Co-evaluation               End of Session Equipment Utilized During Treatment: Gait belt Activity Tolerance: Patient tolerated treatment well Patient left: in chair;with call bell/phone within reach;with family/visitor present;with chair alarm set Nurse Communication: Other (comment) (chair alarm not with cord to call bell)         Time: [id number] PT Time Calculation (min) (ACUTE ONLY): 15 min   Charges:   PT Evaluation $PT Eval Moderate Complexity: 1 Procedure     PT G CodesReginia Naas 03/01/16, 4:56 PM  Magda Kiel, [place] 2016-03-01

## 2016-02-15 NOTE — Discharge Summary (Signed)
.  Physician Discharge Summary  Jon Davis [id number] DOB: December 11, 1926 DOA: 02/12/2016  PCP: Haywood Pao, MD  Admit date: 02/12/2016 Discharge date: 02/15/2016  Admitted From:  [facility] Disposition:  SNF vs private pay to be id soon  Recommendations for Outpatient Follow-up:  1. Follow up with PCP in 1-2 weeks 2. Please obtain BMP/CBC in one week 3. No acute changes to meds--except addition of Tamiflu until 02/18/16  Home Health: NO Equipment/Devices:  nad  Discharge Condition: guarded CODE STATUS: DNR Diet recommendation: Heart Healthy / Carb Modified   Brief/Interim Summary: 83 y/o ? Afib, Tachy-Brady-+ PPM/Medtronic 2011             -CHad2Vasc2 score=5, no on AC 2/2 anemia-on ASA Mobile plaque in desc thor aorta by TEE Klebsiella UTI Alzh Dementia +Restless leg syndrome             -? TIA in 2009 Ty II DM htn CAD-cath 06/2007 60 % circ disease Prostate CA  -Prior episodic urinary retention Discharge Diagnoses:  Principal Problem:   Acute respiratory failure with hypoxia (HCC) Active Problems:   Type 2 diabetes mellitus with vascular disease (HCC)   Essential hypertension   Atrial fibrillation (HCC)   CAD (coronary artery disease)   Pacemaker   Restless legs syndrome (RLS)   Refusal of blood transfusions as patient is Jehovah's Witness   Influenza A   Flu  Flu positive-however patient made rapid recovery.  Dg 2 vw neg for PNA.  Will continue only Tamiflu on dc-can give Tessalon Perles and albuterol nebulizations.  Tachybradycardia syndrome, Medtronic PPM, A. fib [place] score above 6, no anticoagulation-all clinically stable at present cont metoprolol XL 125, hold diuretics as euvolemic Dementia + restless leg syndrome continue Mirapex 0.125 daily, Aricept 23 milligrams daily at bedtime-may use Xanax 0.25 twice a day 4 anxiety or agitation and dementia Chronic kidney disease stage I-Baseline creatinine is 1.1 to 1.5. Currently is 1.6. Monitor in  a.m. 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-holding glipizide 10-resume on d/c-sugars in 100's Hypertension continue amlodipine 5, Imdur 30 Reflux continue pantoprazole 40 daily Hyperlipidemia continue atorvastatin 20 daily   Discharge Instructions  Discharge Instructions    Diet - low sodium heart healthy    Complete by:  As directed    Increase activity slowly    Complete by:  As directed      Allergies as of 02/15/2016      Reactions   Penicillins Rash   Has patient had a PCN reaction causing immediate rash, facial/tongue/throat swelling, SOB or lightheadedness with hypotension: No  Has patient had a PCN reaction causing severe rash involving mucus membranes or skin necrosis: unknown Has patient had a PCN reaction that required hospitalization: No  Has patient had a PCN reaction occurring within the last 10 years: No  If all of the above answers are "NO", then may proceed with Cephalosporin use.      Medication List    STOP taking these medications   atorvastatin 20 MG tablet Commonly known as:  LIPITOR     TAKE these medications   ALPRAZolam 0.25 MG tablet Commonly known as:  XANAX Take 0.25 mg by mouth 2 (two) times daily as needed for anxiety.   amLODipine 5 MG tablet Commonly known as:  NORVASC Take 5 mg by mouth daily.   aspirin 81 MG tablet Take 81 mg by mouth daily.   benzonatate 100 MG capsule Commonly known as:  TESSALON Take 100 mg  by mouth 2 (two) times daily as needed for cough.   donepezil 23 MG Tabs tablet Commonly known as:  ARICEPT Take 1 tablet (23 mg total) by mouth at bedtime.   ferrous sulfate 325 (65 FE) MG tablet Take 325 mg by mouth 2 (two) times daily.   glipiZIDE 10 MG tablet Commonly known as:  GLUCOTROL Take 10 mg by mouth daily.   HYDROcodone-acetaminophen 5-325 MG tablet Commonly known as:  NORCO/VICODIN Take 1 tablet by mouth 2 (two) times daily.   isosorbide mononitrate 30 MG 24 hr tablet Commonly  known as:  IMDUR Take 30 mg by mouth daily.   memantine 10 MG tablet Commonly known as:  NAMENDA Take 1 tablet (10 mg total) by mouth 2 (two) times daily.   metoprolol succinate 25 MG 24 hr tablet Commonly known as:  TOPROL-XL Take 0.5 tablets (12.5 mg total) by mouth daily. Take with or immediately following a meal.   oseltamivir 30 MG capsule Commonly known as:  TAMIFLU Take 1 capsule (30 mg total) by mouth 2 (two) times daily.   pantoprazole 40 MG tablet Commonly known as:  PROTONIX Take 40 mg by mouth daily.   pramipexole 0.125 MG tablet Commonly known as:  MIRAPEX Take 2 tablets (0.25 mg total) by mouth every evening. Two hours before bedtime   sertraline 50 MG tablet Commonly known as:  ZOLOFT Take 1 tablet (50 mg total) by mouth daily.       Allergies  Allergen Reactions  . Penicillins Rash    Has patient had a PCN reaction causing immediate rash, facial/tongue/throat swelling, SOB or lightheadedness with hypotension: No  Has patient had a PCN reaction causing severe rash involving mucus membranes or skin necrosis: unknown Has patient had a PCN reaction that required hospitalization: No  Has patient had a PCN reaction occurring within the last 10 years: No  If all of the above answers are "NO", then may proceed with Cephalosporin use.    Consultations:     Procedures/Studies: Dg Chest 2 View  Result Date: 02/14/2016 CLINICAL DATA:  Cough, history of diabetes and dementia, evaluate for possible pneumonia EXAM: CHEST  2 VIEW COMPARISON:  Chest x-ray of 02/12/2016 FINDINGS: No pneumonia is seen and there is no evidence of pleural effusion. There is some peribronchial thickening present which could indicate bronchitis. Mediastinal and hilar contours are unremarkable. Cardiomegaly is stable and a dual lead permanent pacemaker remains. There are degenerative changes throughout the thoracic spine. IMPRESSION: 1. No definite pneumonia.  Question bronchitis. 2. Stable  cardiomegaly with pacemaker. Electronically Signed   By: Ivar Drape M.D.   On: 02/14/2016 08:27   Ct Head Wo Contrast  Result Date: 02/13/2016 CLINICAL DATA:  Altered mental status. History of hypertension, diabetes, Alzheimer's disease. EXAM: CT HEAD WITHOUT CONTRAST TECHNIQUE: Contiguous axial images were obtained from the base of the skull through the vertex without intravenous contrast. COMPARISON:  CT HEAD October 17, 2015 FINDINGS: BRAIN: Moderate to severe ventriculomegaly with disproportionate temporal horn enlargement compatible with temporal atrophy. Confluent supratentorial white matter hypodensities. Old bilateral basal ganglia lacunar infarcts. No intraparenchymal hemorrhage, mass effect, midline shift or acute large vascular territory infarcts. Basal cisterns are patent. VASCULAR: Moderate calcific atherosclerosis of the carotid siphons. SKULL: No skull fracture. No significant scalp soft tissue swelling. SINUSES/ORBITS: Mild paranasal sinus mucosal thickening. Mastoid air cells are well aerated. Soft tissue within LEFT external auditory canal compatible with cerumen. Status post bilateral ocular lens implants. The included ocular globes and orbital contents  are non-suspicious. OTHER: None. IMPRESSION: No acute intracranial process. Moderate to severe brain atrophy, with disproportionate temporal lobe volume loss associated with neurodegenerative disorders. Severe chronic small vessel ischemic disease. Old basal ganglia infarcts. Electronically Signed   By: Elon Alas M.D.   On: 02/13/2016 02:26   Ct Abdomen Pelvis W Contrast  Result Date: 01/24/2016 CLINICAL DATA:  Lower abdominal pain, weakness, history of Alzheimer's dementia, status post appendectomy, cholecystectomy, transurethral resection of the prostate EXAM: CT ABDOMEN AND PELVIS WITH CONTRAST TECHNIQUE: Multidetector CT imaging of the abdomen and pelvis was performed using the standard protocol following bolus administration  of intravenous contrast. CONTRAST:  44mL ISOVUE-300 IOPAMIDOL (ISOVUE-300) INJECTION 61% COMPARISON:  None. FINDINGS: Lower chest: Lung bases are unremarkable. Partially visualized cardiac pacemaker leads. Borderline cardiomegaly. Hepatobiliary: The patient is status post cholecystectomy. Mild intrahepatic biliary ductal dilatation probable postcholecystectomy. No focal hepatic mass. Punctate calcifications are noted within liver and spleen probable from prior granulomatous disease. Pancreas: Enhanced pancreas is normal. Spleen: Enhanced spleen is normal size. Adrenals/Urinary Tract: No adrenal gland mass. Mild lobulated renal contour. No hydronephrosis or hydroureter. Stable exophytic cyst posterior aspect of the left kidney measures 1.2 cm. Delayed renal images shows bilateral renal symmetrical excretion. Bilateral visualized proximal ureter is unremarkable. There is thickening of urinary bladder wall. Cystitis or chronic inflammation cannot be excluded. Stomach/Bowel: Oral contrast material was given to the patient. No small bowel obstruction. There is mild thickening of the wall of terminal ileum. Mild distal enteritis cannot be excluded. No pericecal inflammation. The patient is status post appendectomy. No colonic obstruction. Multiple diverticula are noted in sigmoid colon. No evidence of acute colitis or diverticulitis. Mild redundant sigmoid colon. Moderate gas and some stool noted within rectum Vascular/Lymphatic: No aortic aneurysm. Atherosclerotic calcifications of abdominal aorta and iliac arteries. Atherosclerotic calcifications are noted SMA origin and bilateral renal artery origin. No adenopathy. Reproductive: The patient is status post transurethral resection of prostate gland. No pelvic masses noted. Other: No ascites or free abdominal air.  No inguinal adenopathy. Musculoskeletal: Sagittal images of the spine shows osteopenia and mild degenerative changes thoracolumbar spine. IMPRESSION: 1. Status  post cholecystectomy. 2. No hydronephrosis or hydroureter.  Stable left renal cyst. 3. There is mild thickening of the wall of terminal small bowel/terminal ileum. Mild distal enteritis cannot be excluded. 4. Status post appendectomy.  No pericecal inflammation. 5. Multiple sigmoid colon diverticula. No evidence of acute colitis or diverticulitis. 6. There is thickening of urinary bladder wall. Chronic inflammation or cystitis cannot be excluded. Status post transurethral resection of prostate gland 7. Mild degenerative changes thoracolumbar spine. 8. Moderate stool and gas noted within rectum. Electronically Signed   By: Lahoma Crocker M.D.   On: 01/24/2016 10:57   Dg Chest Portable 1 View  Result Date: 02/12/2016 CLINICAL DATA:  Altered mental status, fever EXAM: PORTABLE CHEST 1 VIEW COMPARISON:  01/23/2016 FINDINGS: Left-sided pacing device with leads over the right atrium and right ventricle as before. Minimal subsegmental atelectasis left lung base. No acute consolidation or effusion. Stable borderline to mild cardiomegaly. No pneumothorax. IMPRESSION: 1. Minimal subsegmental atelectasis left base.  No acute infiltrate. 2. Stable borderline to mild cardiomegaly Electronically Signed   By: Donavan Foil M.D.   On: 02/12/2016 22:57   Dg Chest Portable 1 View  Result Date: 01/23/2016 CLINICAL DATA:  Adenoma status EXAM: PORTABLE CHEST 1 VIEW COMPARISON:  October 27, 2015 FINDINGS: There is a pacemaker present with lead tips attached to the right atrium and right ventricle. No  pneumothorax. No edema or consolidation. Heart is upper normal in size with pulmonary vascularity within normal limits. There is atherosclerotic calcification in the aorta. No adenopathy. No bone lesions. IMPRESSION: No edema or consolidation. Stable cardiac silhouette. Aortic atherosclerosis. Electronically Signed   By: Lowella Grip III M.D.   On: 01/23/2016 14:27     Subjective:   Discharge Exam: Vitals:   02/15/16 0110  02/15/16 0504  BP: (!) 141/65 (!) 153/65  Pulse: 70 68  Resp: 20 18  Temp: 98.3 F (36.8 C) 98 F (36.7 C)   Vitals:   02/14/16 1533 02/14/16 2100 02/15/16 0110 02/15/16 0504  BP: 126/81 133/70 (!) 141/65 (!) 153/65  Pulse: 78 69 70 68  Resp: 18 19 20 18   Temp: 98.3 F (36.8 C) 97.5 F (36.4 C) 98.3 F (36.8 C) 98 F (36.7 C)  TempSrc: Oral Oral Oral Oral  SpO2: 98% 97% 96% 95%  Weight:   92.2 kg (203 lb 3.2 oz)   Height:   6' (1.829 m)     General: Pt is alert, confused not in acute distress Cardiovascular: RRR, S1/S2 +, no rubs, no gallops, no rales no rhonchi Respiratory: CTA bilaterally, no wheezing, no rhonchi Abdominal: Soft, NT, ND, bowel sounds + Extremities: no edema, no cyanosis    The results of significant diagnostics from this hospitalization (including imaging, microbiology, ancillary and laboratory) are listed below for reference.     Microbiology: Recent Results (from the past 240 hour(s))  Culture, blood (Routine x 2)     Status: None (Preliminary result)   Collection Time: 02/12/16  9:55 PM  Result Value Ref Range Status   Specimen Description BLOOD LEFT ARM  Final   Special Requests BOTTLES DRAWN AEROBIC AND ANAEROBIC 5ML  Final   Culture NO GROWTH 2 DAYS  Final   Report Status PENDING  Incomplete  Urine culture     Status: None   Collection Time: 02/12/16 10:20 PM  Result Value Ref Range Status   Specimen Description URINE, CATHETERIZED  Final   Special Requests NONE  Final   Culture NO GROWTH  Final   Report Status 02/14/2016 FINAL  Final  Culture, blood (Routine x 2)     Status: None (Preliminary result)   Collection Time: 02/12/16 11:56 PM  Result Value Ref Range Status   Specimen Description BLOOD RIGHT HAND  Final   Special Requests AEROBIC BOTTLE ONLY 5ML  Final   Culture NO GROWTH 1 DAY  Final   Report Status PENDING  Incomplete     Labs: BNP (last 3 results) No results for input(s): BNP in the last 8760 hours. Basic Metabolic  Panel:  Recent Labs Lab 02/12/16 2155 02/13/16 0423 02/15/16 0557  NA 140 139 139  K 4.0 3.7 3.4*  CL 106 106 106  CO2 20* 22 23  GLUCOSE 174* 178* 101*  BUN 23* 22* 26*  CREATININE 1.61* 1.61* 1.27*  CALCIUM 8.7* 8.0* 8.4*   Liver Function Tests:  Recent Labs Lab 02/12/16 2155 02/13/16 0423  AST 24 25  ALT 15* 17  ALKPHOS 63 56  BILITOT 0.4 0.5  PROT 6.7 6.4*  ALBUMIN 3.4* 3.3*   No results for input(s): LIPASE, AMYLASE in the last 168 hours. No results for input(s): AMMONIA in the last 168 hours. CBC:  Recent Labs Lab 02/12/16 2155 02/13/16 0423 02/15/16 0557  WBC 9.1 9.8 6.9  NEUTROABS 7.3 8.2* 4.5  HGB 12.7* 12.2* 12.5*  HCT 37.4* 37.2* 37.9*  MCV 89.9  92.1 90.0  PLT PLATELET CLUMPS NOTED ON SMEAR, COUNT APPEARS ADEQUATE 166 171   Cardiac Enzymes: No results for input(s): CKTOTAL, CKMB, CKMBINDEX, TROPONINI in the last 168 hours. BNP: Invalid input(s): POCBNP CBG:  Recent Labs Lab 02/14/16 0853 02/14/16 1206 02/14/16 1712 02/14/16 2016 02/15/16 0609  GLUCAP 120* 108* 108* 107* 102*   D-Dimer No results for input(s): DDIMER in the last 72 hours. Hgb A1c No results for input(s): HGBA1C in the last 72 hours. Lipid Profile No results for input(s): CHOL, HDL, LDLCALC, TRIG, CHOLHDL, LDLDIRECT in the last 72 hours. Thyroid function studies No results for input(s): TSH, T4TOTAL, T3FREE, THYROIDAB in the last 72 hours.  Invalid input(s): FREET3 Anemia work up No results for input(s): VITAMINB12, FOLATE, FERRITIN, TIBC, IRON, RETICCTPCT in the last 72 hours. Urinalysis    Component Value Date/Time   COLORURINE YELLOW 02/12/2016 2220   APPEARANCEUR CLEAR 02/12/2016 2220   LABSPEC 1.019 02/12/2016 2220   PHURINE 6.0 02/12/2016 2220   GLUCOSEU NEGATIVE 02/12/2016 2220   HGBUR SMALL (A) 02/12/2016 2220   BILIRUBINUR NEGATIVE 02/12/2016 2220   BILIRUBINUR small 08/19/2012 1241   KETONESUR NEGATIVE 02/12/2016 2220   PROTEINUR 100 (A) 02/12/2016  2220   UROBILINOGEN 0.2 08/19/2012 1241   UROBILINOGEN 0.2 03/20/2009 0645   NITRITE NEGATIVE 02/12/2016 2220   LEUKOCYTESUR NEGATIVE 02/12/2016 2220   Sepsis Labs Invalid input(s): PROCALCITONIN,  WBC,  LACTICIDVEN Microbiology Recent Results (from the past 240 hour(s))  Culture, blood (Routine x 2)     Status: None (Preliminary result)   Collection Time: 02/12/16  9:55 PM  Result Value Ref Range Status   Specimen Description BLOOD LEFT ARM  Final   Special Requests BOTTLES DRAWN AEROBIC AND ANAEROBIC 5ML  Final   Culture NO GROWTH 2 DAYS  Final   Report Status PENDING  Incomplete  Urine culture     Status: None   Collection Time: 02/12/16 10:20 PM  Result Value Ref Range Status   Specimen Description URINE, CATHETERIZED  Final   Special Requests NONE  Final   Culture NO GROWTH  Final   Report Status 02/14/2016 FINAL  Final  Culture, blood (Routine x 2)     Status: None (Preliminary result)   Collection Time: 02/12/16 11:56 PM  Result Value Ref Range Status   Specimen Description BLOOD RIGHT HAND  Final   Special Requests AEROBIC BOTTLE ONLY 5ML  Final   Culture NO GROWTH 1 DAY  Final   Report Status PENDING  Incomplete     Time coordinating discharge: Over 30 minutes  SIGNED:   Nita Sells, MD  [facility] 02/15/2016, 10:07 AM Pager   If 7PM-7AM, please contact night-coverage [email or web address] Password [email or web address]

## 2016-02-15 NOTE — Progress Notes (Signed)
Patient transfered to 5M14 per Md order earlier to be more accessible for him to visit his wife who is admitted in the hospital.Hand off given to [facility]

## 2016-02-15 NOTE — Evaluation (Signed)
Occupational Therapy Evaluation Patient Details Name: YOUSOF Davis MRN: [id number] DOB: [birth year] Today's Date: 02/15/2016    History of Present Illness Jon Davis is a 83 y.o. male admitted from SNF on 02/12/2016 with pneumonia, hx of Alzheimers   Clinical Impression   Pt with decline in function and safety with ADLs and ADL mobility. Pt with decreased strength, balance and endurance. Pt with very poor memory/recall of PLOF, hx of cognitive deficits. Pt's wife not present to determine baseline level of function/assist. Pt would benefit from acute OT services to address impairments to increase level of function and safety    Follow Up Recommendations  SNF;Supervision/Assistance - 24 hour    Equipment Recommendations  Other (comment);None recommended by OT (TBD at next venue of care)    Recommendations for Other Services PT consult     Precautions / Restrictions Precautions Precautions: Fall Restrictions Weight Bearing Restrictions: No      Mobility Bed Mobility Overal bed mobility: Needs Assistance Bed Mobility: Supine to Sit;Sit to Supine     Supine to sit: Min guard Sit to supine: Min guard   General bed mobility comments: cues for scooting to [facility]  Transfers Overall transfer level: Needs assistance   Transfers: Sit to/from [facility] Sit to Stand: Min assist Stand pivot transfers: Min assist            Balance Overall balance assessment: Needs assistance   Sitting balance-Leahy Scale: Fair       Standing balance-Leahy Scale: Poor                              ADL Overall ADL's : Needs assistance/impaired     Grooming: Wash/dry hands;Wash/dry face;Sitting;Set up;Supervision/safety   Upper Body Bathing: Min guard;Sitting   Lower Body Bathing: Moderate assistance;Sit to/from stand   Upper Body Dressing : Min guard;Sitting   Lower Body Dressing: Moderate assistance;Sit to/from stand   Toilet Transfer:  Minimal assistance;Cueing for safety   Toileting- Clothing Manipulation and Hygiene: Minimal assistance       Functional mobility during ADLs: Minimal assistance General ADL Comments: cues to initiate, Poor standing balance     Vision Vision Assessment?: No apparent visual deficits          Pertinent Vitals/Pain Pain Assessment: No/denies pain     Hand Dominance Right   Extremity/Trunk Assessment Upper Extremity Assessment Upper Extremity Assessment: Generalized weakness   Lower Extremity Assessment Lower Extremity Assessment: Defer to PT evaluation   Cervical / Trunk Assessment Cervical / Trunk Assessment: Normal   Communication Communication Communication: No difficulties   Cognition Arousal/Alertness: Awake/alert Behavior During Therapy: Flat affect Overall Cognitive Status: No family/caregiver present to determine baseline cognitive functioning Area of Impairment: Orientation;Memory;Following commands;Safety/judgement;Awareness Orientation Level: Disoriented to;Place;Time;Situation   Memory: Decreased short-term memory Following Commands: Follows one step commands consistently Safety/Judgement: Decreased awareness of deficits     General Comments: pt states that his mind is "fuzzy" and doesn't remeber alot of things   General Comments   pt very pleasant and cooperative                 Home Living Family/patient expects to be discharged to:: Assisted living                                 Additional Comments: unsure of PLOF-no family present during session. Pt unable to provide  answers due to Poor memory      Prior Functioning/Environment Level of Independence: Needs assistance  Gait / Transfers Assistance Needed: pt states that he uses a RW ADL's / Homemaking Assistance Needed: pt states that wif ehelps him with selfcare   Comments: unsure of PLOF-no family present during session. Pt unable to provide answers due to Poor memory         OT Problem List: Decreased strength;Impaired balance (sitting and/or standing);Decreased cognition;Decreased activity tolerance;Decreased knowledge of use of DME or AE;Decreased safety awareness   OT Treatment/Interventions: DME and/or AE instruction;Therapeutic activities;Self-care/ADL training;Patient/family education;Therapeutic exercise    OT Goals(Current goals can be found in the care plan section) Acute Rehab OT Goals Patient Stated Goal: "see my wife" OT Goal Formulation: Patient unable to participate in goal setting Time For Goal Achievement: 02/22/16 Potential to Achieve Goals: Good ADL Goals Pt Will Perform Grooming: with min guard assist;standing Pt Will Perform Upper Body Bathing: with set-up;with supervision;sitting Pt Will Perform Lower Body Bathing: with min assist;sitting/lateral leans;sit to/from stand Pt Will Perform Upper Body Dressing: with set-up;with supervision;sitting Pt Will Transfer to Toilet: with min guard assist;with supervision;ambulating Pt Will Perform Toileting - Clothing Manipulation and hygiene: with min guard assist;sit to/from stand  OT Frequency: Min 2X/week   Barriers to D/C:  decreased caregiver support?                        End of Session Equipment Utilized During Treatment: Gait belt;Rolling walker  Activity Tolerance: Patient limited by fatigue Patient left: in bed;with call bell/phone within reach;with bed alarm set   Time: [id number] OT Time Calculation (min): 25 min Charges:  OT General Charges $OT Visit: 1 Procedure OT Evaluation $OT Eval Moderate Complexity: 1 Procedure OT Treatments $Therapeutic Activity: 8-22 mins G-Codes:    Jon Davis 02/15/2016, 1:10 PM

## 2016-02-16 LAB — GLUCOSE, CAPILLARY
Glucose-Capillary: 106 mg/dL — ABNORMAL HIGH (ref 65–99)
Glucose-Capillary: 132 mg/dL — ABNORMAL HIGH (ref 65–99)
Glucose-Capillary: 111 mg/dL — ABNORMAL HIGH (ref 65–99)

## 2016-02-16 MED ORDER — ALPRAZOLAM 0.25 MG PO TABS: 0.2500 mg | ORAL_TABLET | Freq: Two times a day (BID) | ORAL | 0 refills | Status: AC | PRN

## 2016-02-16 MED ORDER — SERTRALINE HCL 50 MG PO TABS: 50.0000 mg | ORAL_TABLET | Freq: Every day | ORAL | 3 refills | Status: AC

## 2016-02-16 NOTE — Progress Notes (Signed)
Patient is being discharged to [facility]. Patient is traveling by [facility]. Writer attempted to call twice  at [facility] to give report; and no one answered the phone. Family has been updated regarding the patient's discharge.

## 2016-02-16 NOTE — Progress Notes (Signed)
  Patient seen and examined  No specific new issues or illnesses and seems to be at baseline so can discharge later today  Verneita Griffes, MD [facility] [phone]

## 2016-02-16 NOTE — Clinical Social Work Placement (Signed)
   CLINICAL SOCIAL WORK PLACEMENT  NOTE  Date:  02/16/2016  Patient Details  Name: Jon Davis MRN: [id number] Date of Birth: [birth year]  Clinical Social Work is seeking post-discharge placement for this patient at the [place] level of care (*CSW will initial, date and re-position this form in  chart as items are completed):  Yes   Patient/family provided with [place] Work Department's list of facilities offering this level of care within the geographic area requested by the patient (or if unable, by the patient's family).  Yes   Patient/family informed of their freedom to choose among providers that offer the needed level of care, that participate in Medicare, Medicaid or managed care program needed by the patient, have an available bed and are willing to accept the patient.  Yes   Patient/family informed of [place]'s ownership interest in [facility] and [facility], as well as of the fact that they are under no obligation to receive care at these facilities.  PASRR submitted to EDS on 02/15/16     PASRR number received on 02/15/16     Existing PASRR number confirmed on       FL2 transmitted to all facilities in geographic area requested by pt/family on 02/15/16     FL2 transmitted to all facilities within larger geographic area on       Patient informed that his/her managed care company has contracts with or will negotiate with certain facilities, including the following:        Yes   Patient/family informed of bed offers received.  Patient chooses bed at [facility]     Physician recommends and patient chooses bed at      Patient to be transferred to [facility] on 02/16/16.  Patient to be transferred to facility by Ambulance     Patient family notified on 02/16/16 of transfer.  Name of family member notified:  Loa Socks and Kake Please prepare priority discharge summary, including medications, Please  prepare prescriptions, Please sign FL2, Please sign DNR     Additional Comment:  Per MD patient ready for DC to [facility]. RN, patient, patient's family, and facility notified of DC. RN given number for report. DC packet on chart. Ambulance transport requested for patient by RN. Patient's son and grandson Loa Socks have paid privately for the patient to discharge to [facility]. Jolyne Loa, admission coordinator has approved patient's admission to the facility. CSW signing off.   _______________________________________________ Rigoberto Noel, LCSW 02/16/2016, 9:01 PM

## 2016-02-16 NOTE — [place] FL2 (Signed)
[place] MEDICAID FL2 LEVEL OF CARE SCREENING TOOL     IDENTIFICATION  Patient Name: Jon Davis Birthdate: 15-Dec-1926 Sex: male Admission Date (Current Location): 02/12/2016  [facility] and [place] Number:  Herbalist and Address:  [facility] [place], [place] [address], [place], [place] [address]      Provider Number: [id number]  Attending Physician Name and Address:  Nita Sells, MD  Relative Name and Phone Number:       Current Level of Care: Hospital Recommended Level of Care: [place] Prior Approval Number:    Date Approved/Denied:   PASRR Number:  ([id number])  Discharge Plan: SNF    Current Diagnoses: Patient Active Problem List   Diagnosis Date Noted  . Altered mental status 02/13/2016  . Acute respiratory failure with hypoxia ([place]) 02/13/2016  . Influenza A 02/13/2016  . Flu 02/13/2016  . Refusal of blood transfusions as patient is Jehovah's Witness 10/28/2015  . Hypokalemia 10/27/2015  . UTI (urinary tract infection) 10/21/2015  . Urinary retention 10/21/2015  . Sigmoid diverticulitis 10/17/2015  . Dementia 10/17/2015  . ARF (acute renal failure) ([place]) 10/17/2015  . Diarrhea 10/17/2015  . AKI (acute kidney injury) ([place])   . Cerebral degeneration, unspecified 05/26/2013  . Restless legs syndrome (RLS) 05/26/2013  . CAD (coronary artery disease) 07/31/2012  . Pacemaker 07/31/2012  . Nonsustained ventricular tachycardia ([place]) 07/31/2012  . Aortic atherosclerosis ([place]) 07/31/2012  . Microcytic anemia 06/29/2012  . Long term (current) use of anticoagulants 04/26/2012  . THYROID NODULE 01/23/2010  . Type 2 diabetes mellitus with vascular disease ([place]) 01/23/2010  . Hyperlipidemia 01/23/2010  . ANEMIA, IRON DEFICIENCY 01/23/2010  . Memory loss 01/23/2010  . Essential hypertension 01/23/2010  . Atrial fibrillation ([place]) 01/23/2010  . CVA 01/23/2010  . ESOPHAGEAL STRICTURE 01/23/2010  . HIATAL HERNIA  01/23/2010  . DIVERTICULOSIS, COLON 01/23/2010  . HEMOCCULT POSITIVE STOOL 01/23/2010    Orientation RESPIRATION BLADDER Height & Weight     Place, Self    Continent Weight: 203 lb 3.2 oz (92.2 kg) Height:  6' (182.9 cm)  BEHAVIORAL SYMPTOMS/MOOD NEUROLOGICAL BOWEL NUTRITION STATUS      Continent Diet (heart healthy/low sodium)  AMBULATORY STATUS COMMUNICATION OF NEEDS Skin   Limited Assist Verbally Normal                       Personal Care Assistance Level of Assistance  Bathing, Feeding, Dressing Bathing Assistance: Limited assistance Feeding assistance: Independent Dressing Assistance: Limited assistance     Functional Limitations Info  Sight, Hearing, Speech Sight Info: Adequate Hearing Info: Adequate Speech Info: Adequate    SPECIAL CARE FACTORS FREQUENCY  PT (By licensed PT), OT (By licensed OT)     PT Frequency:  (5x/week) OT Frequency:  (5x/week)            Contractures Contractures Info: Not present    Additional Factors Info  Code Status Code Status Info:  (DNR)             Current Medications (02/16/2016):  This is the current hospital active medication list Current Facility-Administered Medications  Medication Dose Route Frequency Provider Last Rate Last Dose  . acetaminophen (TYLENOL) tablet 650 mg  650 mg Oral Q6H PRN [place] Patience, MD   650 mg at 02/13/16 2107   Or  . acetaminophen (TYLENOL) suppository 650 mg  650 mg Rectal Q6H PRN Rise Patience, MD      . ALPRAZolam Duanne Moron)  tablet 0.25 mg  0.25 mg Oral BID PRN Rise Patience, MD   0.25 mg at 02/15/16 2058  . amLODipine (NORVASC) tablet 5 mg  5 mg Oral Daily Rise Patience, MD   5 mg at 02/15/16 0905  . aspirin chewable tablet 81 mg  81 mg Oral Daily Rise Patience, MD   81 mg at 02/15/16 0900  . atorvastatin (LIPITOR) tablet 20 mg  20 mg Oral QPM Rise Patience, MD   20 mg at 02/15/16 1738  . benzonatate (TESSALON) capsule 100 mg  100 mg Oral BID PRN  Rise Patience, MD   100 mg at 02/15/16 2058  . donepezil (ARICEPT) tablet 23 mg  23 mg Oral QHS Rise Patience, MD   23 mg at 02/15/16 2058  . enoxaparin (LOVENOX) injection 40 mg  40 mg Subcutaneous Q24H Rise Patience, MD   40 mg at 02/15/16 1318  . ferrous sulfate tablet 325 mg  325 mg Oral BID Rise Patience, MD   325 mg at 02/15/16 2058  . insulin aspart (novoLOG) injection 0-9 Units  0-9 Units Subcutaneous TID WC Rise Patience, MD   1 Units at 02/15/16 1319  . ipratropium-albuterol (DUONEB) 0.5-2.5 (3) MG/3ML nebulizer solution 3 mL  3 mL Nebulization BID Rise Patience, MD   3 mL at 02/15/16 2045  . isosorbide mononitrate (IMDUR) 24 hr tablet 30 mg  30 mg Oral Daily Rise Patience, MD   30 mg at 02/15/16 0900  . memantine (NAMENDA) tablet 10 mg  10 mg Oral BID Rise Patience, MD   10 mg at 02/15/16 2058  . metoprolol succinate (TOPROL-XL) 24 hr tablet 12.5 mg  12.5 mg Oral Daily Rise Patience, MD   12.5 mg at 02/15/16 0900  . ondansetron (ZOFRAN) tablet 4 mg  4 mg Oral Q6H PRN Rise Patience, MD       Or  . ondansetron [facility]) injection 4 mg  4 mg Intravenous Q6H PRN Rise Patience, MD      . oseltamivir (TAMIFLU) capsule 30 mg  30 mg Oral BID Geradine Girt, DO   30 mg at 02/15/16 2057  . pantoprazole (PROTONIX) EC tablet 40 mg  40 mg Oral Daily Rise Patience, MD   40 mg at 02/15/16 0900  . pramipexole (MIRAPEX) tablet 0.25 mg  0.25 mg Oral QPM Rise Patience, MD   0.25 mg at 02/15/16 1738  . sertraline (ZOLOFT) tablet 50 mg  50 mg Oral Daily Rise Patience, MD   50 mg at 02/15/16 0900     Discharge Medications: Please see discharge summary for a list of discharge medications.  Relevant Imaging Results:  Relevant Lab Results:   Additional Information    Robinette Esters M, LCSW

## 2016-02-17 LAB — CULTURE, BLOOD (ROUTINE X 2): Culture: NO GROWTH

## 2016-02-18 ENCOUNTER — Non-Acute Institutional Stay (SKILLED_NURSING_FACILITY): Payer: Medicare Other | Admitting: Adult Health

## 2016-02-18 ENCOUNTER — Encounter: Payer: Self-pay | Admitting: Adult Health

## 2016-02-18 DIAGNOSIS — G309 Alzheimer's disease, unspecified: Secondary | ICD-10-CM

## 2016-02-18 DIAGNOSIS — F419 Anxiety disorder, unspecified: Secondary | ICD-10-CM

## 2016-02-18 DIAGNOSIS — R2689 Other abnormalities of gait and mobility: Secondary | ICD-10-CM | POA: Diagnosis not present

## 2016-02-18 DIAGNOSIS — R278 Other lack of coordination: Secondary | ICD-10-CM | POA: Diagnosis not present

## 2016-02-18 DIAGNOSIS — E1159 Type 2 diabetes mellitus with other circulatory complications: Secondary | ICD-10-CM

## 2016-02-18 DIAGNOSIS — J101 Influenza due to other identified influenza virus with other respiratory manifestations: Secondary | ICD-10-CM

## 2016-02-18 DIAGNOSIS — I2510 Atherosclerotic heart disease of native coronary artery without angina pectoris: Secondary | ICD-10-CM

## 2016-02-18 DIAGNOSIS — I10 Essential (primary) hypertension: Secondary | ICD-10-CM

## 2016-02-18 DIAGNOSIS — I482 Chronic atrial fibrillation, unspecified: Secondary | ICD-10-CM

## 2016-02-18 DIAGNOSIS — R5381 Other malaise: Secondary | ICD-10-CM | POA: Diagnosis not present

## 2016-02-18 DIAGNOSIS — F331 Major depressive disorder, recurrent, moderate: Secondary | ICD-10-CM | POA: Diagnosis not present

## 2016-02-18 DIAGNOSIS — K219 Gastro-esophageal reflux disease without esophagitis: Secondary | ICD-10-CM | POA: Diagnosis not present

## 2016-02-18 DIAGNOSIS — N400 Enlarged prostate without lower urinary tract symptoms: Secondary | ICD-10-CM

## 2016-02-18 DIAGNOSIS — G2581 Restless legs syndrome: Secondary | ICD-10-CM

## 2016-02-18 DIAGNOSIS — N183 Chronic kidney disease, stage 3 unspecified: Secondary | ICD-10-CM

## 2016-02-18 DIAGNOSIS — M6281 Muscle weakness (generalized): Secondary | ICD-10-CM | POA: Diagnosis not present

## 2016-02-18 DIAGNOSIS — F0280 Dementia in other diseases classified elsewhere without behavioral disturbance: Secondary | ICD-10-CM

## 2016-02-18 LAB — CULTURE, BLOOD (ROUTINE X 2): CULTURE: NO GROWTH

## 2016-02-18 NOTE — Progress Notes (Signed)
DATE:  02/18/2016   MRN:  [id number]  BIRTHDAY: 1926/08/17  Facility:  [facility] Location:  [place] Room Number: 1003-B  LEVEL OF CARE:  SNF (31)  Contact Information    Name Relation Home Work [place] B Spouse [phone]  [phone]   Costella Hatcher [phone]     Rubie Maid [phone]         Code Status History    Date Active Date Inactive Code Status Order ID Comments User Context   02/13/2016  3:08 AM 02/16/2016 10:53 PM DNR [id number]  Rise Patience, MD ED   01/23/2016  6:26 PM 01/26/2016  3:19 PM DNR [id number]  Debbe Odea, MD ED   10/27/2015  3:54 PM 10/31/2015  9:22 PM DNR [id number]  Ivor Costa, MD ED   10/17/2015  9:49 PM 10/21/2015  8:19 PM DNR [id number]  Rise Patience, MD ED   10/17/2015  9:34 PM 10/17/2015  9:48 PM Full Code [id number]  Rise Patience, MD ED   09/05/2013 10:40 PM 09/06/2013  3:08 AM DNR [id number]  Carmin Muskrat, MD ED    Questions for Most Recent Historical Code Status (Order [id number])    Question Answer Comment   In the event of cardiac or respiratory ARREST Do not call a "code blue"    In the event of cardiac or respiratory ARREST Do not perform Intubation, CPR, defibrillation or ACLS    In the event of cardiac or respiratory ARREST Use medication by any route, position, wound care, and other measures to relive pain and suffering. May use oxygen, suction and manual treatment of airway obstruction as needed for comfort.        Chief Complaint  Patient presents with  . Hospitalization Follow-up    HISTORY OF PRESENT ILLNESS:  This is an 81-YO male admitted to [facility] on 02/16/16 for short-term rehabilitation following an admission at [facility] 02/12/16-02/16/16. He has PMH of dementia, CAD, tachybradycardia syndrome S/P pacemaker placement and atrial fibrillation. He was having increasing confusion, fever, shortness of breath and productive cough. He  was placed on empiric antibiotics. He tested + flu A and was started on Tamiflu. Chest x-ray was negative for PNA so vancomycin and ceftriaxone were discontinued.  He has been admitted for a short-term rehabilitation.  PAST MEDICAL HISTORY:  Past Medical History:  Diagnosis Date  . Anemia 06/2012  . Atrial fibrillation ([place])   . BPH (benign prostatic hyperplasia)    Followed by Dr. Matilde Sprang  . Brady-tachy syndrome ([place])    04/17/2009 MDT pacer Enrhythmatr fib  . CAD (coronary artery disease)   . Dementia in Alzheimer's disease   . Depression   . Diabetes ([place])   . Diverticulosis 02/26/2010   Sigmoid colon  . Duodenitis 02/26/2010  . Elevated PSA   . Episodic VTach, pacer    Followed by Dr. Avon Gully  . Fall 03/2014   elbow fluid   . Gastritis 02/26/2010   moderate  . Hiatal hernia 02/26/2010  . Hyperlipidemia   . Hypertension   . Memory loss   . Microalbuminuria   . Urinary incontinence      CURRENT MEDICATIONS: Reviewed  Patient's Medications  New Prescriptions   No medications on file  Previous Medications   ALPRAZOLAM (XANAX) 0.25 MG TABLET    Take 1 tablet (0.25 mg total) by mouth 2 (two) times daily as needed for anxiety.   AMLODIPINE (NORVASC) 5 MG TABLET  Take 5 mg by mouth daily.    ASPIRIN 81 MG TABLET    Take 81 mg by mouth daily.   BENZONATATE (TESSALON) 100 MG CAPSULE    Take 100 mg by mouth 2 (two) times daily as needed for cough.   DONEPEZIL (ARICEPT) 23 MG TABS TABLET    Take 1 tablet (23 mg total) by mouth at bedtime.   FERROUS SULFATE 325 (65 FE) MG TABLET    Take 325 mg by mouth 2 (two) times daily.   GLIPIZIDE (GLUCOTROL) 10 MG TABLET    Take 10 mg by mouth daily.   HYDROCODONE-ACETAMINOPHEN (NORCO/VICODIN) 5-325 MG TABLET    Take 1 tablet by mouth 2 (two) times daily.   ISOSORBIDE MONONITRATE (IMDUR) 30 MG 24 HR TABLET    Take 30 mg by mouth daily.   MEMANTINE (NAMENDA) 10 MG TABLET    Take 1 tablet (10 mg total) by mouth 2 (two) times daily.    METOPROLOL SUCCINATE (TOPROL-XL) 25 MG 24 HR TABLET    Take 0.5 tablets (12.5 mg total) by mouth daily. Take with or immediately following a meal.   OSELTAMIVIR (TAMIFLU) 30 MG CAPSULE    Take 1 capsule (30 mg total) by mouth 2 (two) times daily.   PANTOPRAZOLE (PROTONIX) 40 MG TABLET    Take 40 mg by mouth daily.    PRAMIPEXOLE (MIRAPEX) 0.125 MG TABLET    Take 2 tablets (0.25 mg total) by mouth every evening. Two hours before bedtime   SERTRALINE (ZOLOFT) 50 MG TABLET    Take 1 tablet (50 mg total) by mouth daily.  Modified Medications   No medications on file  Discontinued Medications   No medications on file     Allergies  Allergen Reactions  . Penicillins Rash    Has patient had a PCN reaction causing immediate rash, facial/tongue/throat swelling, SOB or lightheadedness with hypotension: No  Has patient had a PCN reaction causing severe rash involving mucus membranes or skin necrosis: unknown Has patient had a PCN reaction that required hospitalization: No  Has patient had a PCN reaction occurring within the last 10 years: No  If all of the above answers are "NO", then may proceed with Cephalosporin use.     REVIEW OF SYSTEMS:  GENERAL: no change in appetite, no fatigue, no weight changes, no fever, chills or weakness EYES: Denies change in vision, dry eyes, eye pain, itching or discharge EARS: Denies change in hearing, ringing in ears, or earache NOSE: Denies nasal congestion or epistaxis MOUTH and THROAT: Denies oral discomfort, gingival pain or bleeding, pain from teeth or hoarseness   RESPIRATORY: no cough, SOB, DOE, wheezing, hemoptysis CARDIAC: no chest pain, edema or palpitations GI: no abdominal pain, diarrhea, constipation, heart burn, nausea or vomiting GU: Denies dysuria, frequency, hematuria, incontinence, or discharge PSYCHIATRIC: Denies feeling of depression or anxiety. No report of hallucinations, insomnia, paranoia, or agitation    PHYSICAL  EXAMINATION  GENERAL APPEARANCE: Well nourished. In no acute distress. Normal body habitus SKIN:  Skin is warm and dry.  HEAD: Normal in size and contour. No evidence of trauma EYES: Lids open and close normally. No blepharitis, entropion or ectropion. PERRL. Conjunctivae are clear and sclerae are white. Lenses are without opacity EARS: Pinnae are normal. Patient hears normal voice tunes of the examiner MOUTH and THROAT: Lips are without lesions. Oral mucosa is moist and without lesions. Tongue is normal in shape, size, and color and without lesions NECK: supple, trachea midline, no neck masses,  no thyroid tenderness, no thyromegaly LYMPHATICS: no LAN in the neck, no supraclavicular LAN RESPIRATORY: breathing is even & unlabored, BS CTAB CARDIAC: RRR, no murmur,no extra heart sounds, no edema, left chest pacemaker GI: abdomen soft, normal BS, no masses, no tenderness, no hepatomegaly, no splenomegaly EXTREMITIES:  Able to move 4 extremities PSYCHIATRIC: Alert to self, disoriented to time and place. Affect and behavior are appropriate  LABS/RADIOLOGY: Labs reviewed: Basic Metabolic Panel:  Recent Labs  10/28/15 0425  10/30/15 0400  02/12/16 2155 02/13/16 0423 02/15/16 0557  NA 142  < > 140  < > 140 139 139  K 3.1*  < > 3.6  < > 4.0 3.7 3.4*  CL 106  < > 106  < > 106 106 106  CO2 30  < > 29  < > 20* 22 23  GLUCOSE 143*  < > 137*  < > 174* 178* 101*  BUN 14  < > 13  < > 23* 22* 26*  CREATININE 1.39*  < > 1.16  < > 1.61* 1.61* 1.27*  CALCIUM 8.2*  < > 8.3*  < > 8.7* 8.0* 8.4*  MG 1.6*  --  1.7  --   --   --   --   < > = values in this interval not displayed. Liver Function Tests:  Recent Labs  01/23/16 1340 02/12/16 2155 02/13/16 0423  AST 30 24 25   ALT 11* 15* 17  ALKPHOS 81 63 56  BILITOT 1.4* 0.4 0.5  PROT 7.6 6.7 6.4*  ALBUMIN 4.0 3.4* 3.3*    Recent Labs  10/17/15 1852  LIPASE 22   CBC:  Recent Labs  02/12/16 2155 02/13/16 0423 02/15/16 0557  WBC 9.1  9.8 6.9  NEUTROABS 7.3 8.2* 4.5  HGB 12.7* 12.2* 12.5*  HCT 37.4* 37.2* 37.9*  MCV 89.9 92.1 90.0  PLT PLATELET CLUMPS NOTED ON SMEAR, COUNT APPEARS ADEQUATE 166 171   Cardiac Enzymes:  Recent Labs  10/27/15 2143 10/28/15 0425 10/28/15 1255  TROPONINI <0.03 <0.03 <0.03   CBG:  Recent Labs  02/16/16 0636 02/16/16 1119 02/16/16 1626  GLUCAP 106* 132* 111*      Dg Chest 2 View  Result Date: 02/14/2016 CLINICAL DATA:  Cough, history of diabetes and dementia, evaluate for possible pneumonia EXAM: CHEST  2 VIEW COMPARISON:  Chest x-ray of 02/12/2016 FINDINGS: No pneumonia is seen and there is no evidence of pleural effusion. There is some peribronchial thickening present which could indicate bronchitis. Mediastinal and hilar contours are unremarkable. Cardiomegaly is stable and a dual lead permanent pacemaker remains. There are degenerative changes throughout the thoracic spine. IMPRESSION: 1. No definite pneumonia.  Question bronchitis. 2. Stable cardiomegaly with pacemaker. Electronically Signed   By: Ivar Drape M.D.   On: 02/14/2016 08:27   Ct Head Wo Contrast  Result Date: 02/13/2016 CLINICAL DATA:  Altered mental status. History of hypertension, diabetes, Alzheimer's disease. EXAM: CT HEAD WITHOUT CONTRAST TECHNIQUE: Contiguous axial images were obtained from the base of the skull through the vertex without intravenous contrast. COMPARISON:  CT HEAD October 17, 2015 FINDINGS: BRAIN: Moderate to severe ventriculomegaly with disproportionate temporal horn enlargement compatible with temporal atrophy. Confluent supratentorial white matter hypodensities. Old bilateral basal ganglia lacunar infarcts. No intraparenchymal hemorrhage, mass effect, midline shift or acute large vascular territory infarcts. Basal cisterns are patent. VASCULAR: Moderate calcific atherosclerosis of the carotid siphons. SKULL: No skull fracture. No significant scalp soft tissue swelling. SINUSES/ORBITS: Mild  paranasal sinus mucosal thickening. Mastoid air cells  are well aerated. Soft tissue within LEFT external auditory canal compatible with cerumen. Status post bilateral ocular lens implants. The included ocular globes and orbital contents are non-suspicious. OTHER: None. IMPRESSION: No acute intracranial process. Moderate to severe brain atrophy, with disproportionate temporal lobe volume loss associated with neurodegenerative disorders. Severe chronic small vessel ischemic disease. Old basal ganglia infarcts. Electronically Signed   By: Elon Alas M.D.   On: 02/13/2016 02:26   Ct Abdomen Pelvis W Contrast  Result Date: 01/24/2016 CLINICAL DATA:  Lower abdominal pain, weakness, history of Alzheimer's dementia, status post appendectomy, cholecystectomy, transurethral resection of the prostate EXAM: CT ABDOMEN AND PELVIS WITH CONTRAST TECHNIQUE: Multidetector CT imaging of the abdomen and pelvis was performed using the standard protocol following bolus administration of intravenous contrast. CONTRAST:  71mL ISOVUE-300 IOPAMIDOL (ISOVUE-300) INJECTION 61% COMPARISON:  None. FINDINGS: Lower chest: Lung bases are unremarkable. Partially visualized cardiac pacemaker leads. Borderline cardiomegaly. Hepatobiliary: The patient is status post cholecystectomy. Mild intrahepatic biliary ductal dilatation probable postcholecystectomy. No focal hepatic mass. Punctate calcifications are noted within liver and spleen probable from prior granulomatous disease. Pancreas: Enhanced pancreas is normal. Spleen: Enhanced spleen is normal size. Adrenals/Urinary Tract: No adrenal gland mass. Mild lobulated renal contour. No hydronephrosis or hydroureter. Stable exophytic cyst posterior aspect of the left kidney measures 1.2 cm. Delayed renal images shows bilateral renal symmetrical excretion. Bilateral visualized proximal ureter is unremarkable. There is thickening of urinary bladder wall. Cystitis or chronic inflammation cannot  be excluded. Stomach/Bowel: Oral contrast material was given to the patient. No small bowel obstruction. There is mild thickening of the wall of terminal ileum. Mild distal enteritis cannot be excluded. No pericecal inflammation. The patient is status post appendectomy. No colonic obstruction. Multiple diverticula are noted in sigmoid colon. No evidence of acute colitis or diverticulitis. Mild redundant sigmoid colon. Moderate gas and some stool noted within rectum Vascular/Lymphatic: No aortic aneurysm. Atherosclerotic calcifications of abdominal aorta and iliac arteries. Atherosclerotic calcifications are noted SMA origin and bilateral renal artery origin. No adenopathy. Reproductive: The patient is status post transurethral resection of prostate gland. No pelvic masses noted. Other: No ascites or free abdominal air.  No inguinal adenopathy. Musculoskeletal: Sagittal images of the spine shows osteopenia and mild degenerative changes thoracolumbar spine. IMPRESSION: 1. Status post cholecystectomy. 2. No hydronephrosis or hydroureter.  Stable left renal cyst. 3. There is mild thickening of the wall of terminal small bowel/terminal ileum. Mild distal enteritis cannot be excluded. 4. Status post appendectomy.  No pericecal inflammation. 5. Multiple sigmoid colon diverticula. No evidence of acute colitis or diverticulitis. 6. There is thickening of urinary bladder wall. Chronic inflammation or cystitis cannot be excluded. Status post transurethral resection of prostate gland 7. Mild degenerative changes thoracolumbar spine. 8. Moderate stool and gas noted within rectum. Electronically Signed   By: Lahoma Crocker M.D.   On: 01/24/2016 10:57   Dg Chest Portable 1 View  Result Date: 02/12/2016 CLINICAL DATA:  Altered mental status, fever EXAM: PORTABLE CHEST 1 VIEW COMPARISON:  01/23/2016 FINDINGS: Left-sided pacing device with leads over the right atrium and right ventricle as before. Minimal subsegmental atelectasis left  lung base. No acute consolidation or effusion. Stable borderline to mild cardiomegaly. No pneumothorax. IMPRESSION: 1. Minimal subsegmental atelectasis left base.  No acute infiltrate. 2. Stable borderline to mild cardiomegaly Electronically Signed   By: Donavan Foil M.D.   On: 02/12/2016 22:57   Dg Chest Portable 1 View  Result Date: 01/23/2016 CLINICAL DATA:  Adenoma status EXAM: PORTABLE  CHEST 1 VIEW COMPARISON:  October 27, 2015 FINDINGS: There is a pacemaker present with lead tips attached to the right atrium and right ventricle. No pneumothorax. No edema or consolidation. Heart is upper normal in size with pulmonary vascularity within normal limits. There is atherosclerotic calcification in the aorta. No adenopathy. No bone lesions. IMPRESSION: No edema or consolidation. Stable cardiac silhouette. Aortic atherosclerosis. Electronically Signed   By: Lowella Grip III M.D.   On: 01/23/2016 14:27    ASSESSMENT/PLAN:  Physical deconditioning - for rehabilitation, PT and OT, for therapeutic strengthening exercises; fall precautions  Influenza A - tested +Flu A on 02/13/15 and was started on Tamiflu, will continue till 02/21/16, benzonatate 100 mg 1 capsule by mouth twice a day when necessary  Hypertension - continue amlodipine besylate 5 mg 1 tab by mouth daily and Toprol-XL 25 mg 1/2 tab = 12.5 mg by mouth daily  Atrial fibrillation - rate controlled; continue Toprol-XL 25 mg 1/2 tab = 12.5 mg PO Q D; not on anticoagulation; has Meditronic PPM  Alzheimer's dementia - continue supportive care; continue Namenda 10 mg 1 tab by mouth twice a day and Aricept 23 mg 1 tab by mouth daily at bedtime; fall precautions  Anxiety - continue Alprazolam 0.25 mg 1 tab PO Q D PRN X 14 days then needs to be reassessed  Diabetes mellitus, type II - Continue glipizide 10 mg 1 tab by mouth daily; CBG daily Lab Results  Component Value Date   HGBA1C 7.1 (H) 10/28/2015   CAD - no chest pains; continue  Imdue ER 30 mg 1 tab by mouth daily and aspirin 81 mg 1 tab by mouth daily  Chronic kidney disease, stage III - check BMP Lab Results  Component Value Date   CREATININE 1.27 (H) 02/15/2016   Restless leg syndrome - continue pramipexole 0.125 mg given 2 tabs = 0.25 mg by mouth daily evening  Depression - mood is stable; continue sertraline 50 mg 1 tab by mouth daily  GERD - continue pantoprazole 40 mg 1 tab by mouth daily  Anemia - ; check CBC Lab Results  Component Value Date   HGB 12.5 (L) 02/15/2016   BPH - has occasional urinary retention, currently voiding without difficulty; monitor for urinary retention     Goals of care:  Short-term rehabilitation    Annalyse Langlais C. Jenner  -  NP [employer] [phone]

## 2016-02-19 ENCOUNTER — Encounter: Payer: Self-pay | Admitting: Internal Medicine

## 2016-02-19 ENCOUNTER — Non-Acute Institutional Stay (SKILLED_NURSING_FACILITY): Payer: Medicare Other | Admitting: Internal Medicine

## 2016-02-19 DIAGNOSIS — K222 Esophageal obstruction: Secondary | ICD-10-CM | POA: Diagnosis not present

## 2016-02-19 DIAGNOSIS — F32A Depression, unspecified: Secondary | ICD-10-CM

## 2016-02-19 DIAGNOSIS — I482 Chronic atrial fibrillation, unspecified: Secondary | ICD-10-CM

## 2016-02-19 DIAGNOSIS — R531 Weakness: Secondary | ICD-10-CM

## 2016-02-19 DIAGNOSIS — E1159 Type 2 diabetes mellitus with other circulatory complications: Secondary | ICD-10-CM

## 2016-02-19 DIAGNOSIS — J09X1 Influenza due to identified novel influenza A virus with pneumonia: Secondary | ICD-10-CM | POA: Diagnosis not present

## 2016-02-19 DIAGNOSIS — F329 Major depressive disorder, single episode, unspecified: Secondary | ICD-10-CM | POA: Diagnosis not present

## 2016-02-19 DIAGNOSIS — F0390 Unspecified dementia without behavioral disturbance: Secondary | ICD-10-CM | POA: Diagnosis not present

## 2016-02-19 DIAGNOSIS — R2689 Other abnormalities of gait and mobility: Secondary | ICD-10-CM | POA: Diagnosis not present

## 2016-02-19 DIAGNOSIS — I10 Essential (primary) hypertension: Secondary | ICD-10-CM

## 2016-02-19 DIAGNOSIS — R278 Other lack of coordination: Secondary | ICD-10-CM | POA: Diagnosis not present

## 2016-02-19 DIAGNOSIS — G2581 Restless legs syndrome: Secondary | ICD-10-CM | POA: Diagnosis not present

## 2016-02-19 DIAGNOSIS — M6281 Muscle weakness (generalized): Secondary | ICD-10-CM | POA: Diagnosis not present

## 2016-02-19 NOTE — Progress Notes (Signed)
LOCATION: [facility]  PCP: Haywood Pao, MD   Code Status: Full Code  Goals of care: Advanced Directive information Advanced Directives 02/12/2016  Does Patient Have a Medical Advance Directive? No  Type of Advance Directive -  Copy of [place] in Chart? -  Would patient like information on creating a medical advance directive? -       Extended Emergency Contact Information Primary Emergency Contact: Snook,[name] B Address: [address]          [place], [place] [address] Johnnette Litter of [place] Phone: [phone] Mobile Phone: [phone] Relation: Spouse Secondary Emergency Contact: Donney Dice [place] of [place] Phone: [phone] Relation: Grandson   Allergies  Allergen Reactions  . Penicillins Rash    Has patient had a PCN reaction causing immediate rash, facial/tongue/throat swelling, SOB or lightheadedness with hypotension: No  Has patient had a PCN reaction causing severe rash involving mucus membranes or skin necrosis: unknown Has patient had a PCN reaction that required hospitalization: No  Has patient had a PCN reaction occurring within the last 10 years: No  If all of the above answers are "NO", then may proceed with Cephalosporin use.    Chief Complaint  Patient presents with  . New Admit To SNF    New Admission Visit      HPI:  Patient is a 83 y.o. male seen today for short term rehabilitation post hospital admission from01/03/2016-02/25/2016 with fever and change in mental status. CT head was unremarkable and chest x-ray was concerning for pneumonia. He was started on antibiotics. Influenza A was positive and he was started on Tamiflu and other antibiotics were discontinued. He has medical history of dementia, tachycardia bradycardia syndrome status post pacemaker, atrial fibrillation, coronary artery disease among others. He is seen in his room today.  Review of Systems:  Constitutional: Negative for  fever, chills, diaphoresis.  HENT: Negative for headache, congestion, nasal discharge, difficulty swallowing. Positive for myalgia.  Eyes: Negative for blurred vision, double vision and discharge.  Respiratory: Negative for cough, shortness of breath and wheezing.   Cardiovascular: Negative for chest pain, palpitations, leg swelling.  Gastrointestinal: Negative for heartburn, nausea, vomiting, abdominal pain. Doesn't remember his last bowel movement. Genitourinary: Negative for dysuria and flank pain.  Musculoskeletal: Negative for back pain, fall in the facility.  Skin: Negative for itching, rash.  Neurological: Negative for dizziness. Psychiatric/Behavioral: Negative for depression   Past Medical History:  Diagnosis Date  . Anemia 06/2012  . Atrial fibrillation ([place])   . BPH (benign prostatic hyperplasia)    Followed by Dr. Matilde Sprang  . Brady-tachy syndrome ([place])    04/17/2009 MDT pacer Enrhythmatr fib  . CAD (coronary artery disease)   . Dementia in Alzheimer's disease   . Depression   . Diabetes ([place])   . Diverticulosis 02/26/2010   Sigmoid colon  . Duodenitis 02/26/2010  . Elevated PSA   . Episodic VTach, pacer    Followed by Dr. Avon Gully  . Fall 03/2014   elbow fluid   . Gastritis 02/26/2010   moderate  . Hiatal hernia 02/26/2010  . Hyperlipidemia   . Hypertension   . Memory loss   . Microalbuminuria   . Urinary incontinence    Past Surgical History:  Procedure Laterality Date  . APPENDECTOMY  1948  . CARDIAC CATHETERIZATION  07/07/2007   mild to mod. ca+ w/narrowing of 20% ostium of second diagonal,diffuse 80% stenosis small caliber infer. branch,20% mid LAD,60-70% narrowing mid atrioventricular  groove CX,mild 20-30% RCA  . CAROTID DOPPLER  10/01/2006   0-49% right bulb,prox,ECA & prox ICA,0-49% left subclavian bulb & prox ICA,right & left ICA abn. resistance  . Cataracts    . CHOLECYSTECTOMY    . CIRCUMCISION N/A 10/28/2015   Procedure: CIRCUMCISION ADULT,  DORSAL SLIT;  Surgeon: Ardis Hughs, MD;  Location: [facility];  Service: Urology;  Laterality: N/A;  . NM MYOVIEW LTD  10/01/2010   No ischemia  . PERMANENT PACEMAKER INSERTION  04/17/2009   MDT Enrhythm  . TRANSURETHRAL RESECTION OF PROSTATE N/A 10/28/2015   Procedure: TRANSURETHRAL RESECTION OF THE PROSTATE (TURP);  Surgeon: Ardis Hughs, MD;  Location: [facility];  Service: Urology;  Laterality: N/A;   Social History:   reports that he has quit smoking. He quit smokeless tobacco use about 42 years ago. He reports that he does not drink alcohol or use drugs.  Family History  Problem Relation Age of Onset  . Alzheimer's disease Mother     Deceased  . Heart disease Father     Deceased    Medications: Allergies as of 02/19/2016      Reactions   Penicillins Rash   Has patient had a PCN reaction causing immediate rash, facial/tongue/throat swelling, SOB or lightheadedness with hypotension: No  Has patient had a PCN reaction causing severe rash involving mucus membranes or skin necrosis: unknown Has patient had a PCN reaction that required hospitalization: No  Has patient had a PCN reaction occurring within the last 10 years: No  If all of the above answers are "NO", then may proceed with Cephalosporin use.      Medication List       Accurate as of 02/19/16  2:40 PM. Always use your most recent med list.          ALPRAZolam 0.25 MG tablet Commonly known as:  XANAX Take 1 tablet (0.25 mg total) by mouth 2 (two) times daily as needed for anxiety.   amLODipine 5 MG tablet Commonly known as:  NORVASC Take 5 mg by mouth daily.   aspirin 81 MG tablet Take 81 mg by mouth daily.   benzonatate 100 MG capsule Commonly known as:  TESSALON Take 100 mg by mouth 2 (two) times daily as needed for cough.   donepezil 23 MG Tabs tablet Commonly known as:  ARICEPT Take 1 tablet (23 mg total) by mouth at bedtime.   glipiZIDE 10 MG tablet Commonly known as:  GLUCOTROL Take 10 mg by  mouth daily.   HYDROcodone-acetaminophen 5-325 MG tablet Commonly known as:  NORCO/VICODIN Take 1 tablet by mouth 2 (two) times daily.   isosorbide mononitrate 30 MG 24 hr tablet Commonly known as:  IMDUR Take 30 mg by mouth daily.   memantine 10 MG tablet Commonly known as:  NAMENDA Take 1 tablet (10 mg total) by mouth 2 (two) times daily.   metoprolol succinate 25 MG 24 hr tablet Commonly known as:  TOPROL-XL Take 0.5 tablets (12.5 mg total) by mouth daily. Take with or immediately following a meal.   pantoprazole 40 MG tablet Commonly known as:  PROTONIX Take 40 mg by mouth daily.   pramipexole 0.125 MG tablet Commonly known as:  MIRAPEX Take 2 tablets (0.25 mg total) by mouth every evening. Two hours before bedtime   sertraline 50 MG tablet Commonly known as:  ZOLOFT Take 1 tablet (50 mg total) by mouth daily.       Immunizations: Immunization History  Administered Date(s) Administered  .  Influenza,inj,Quad PF,36+ Mos 11/15/2015     Physical Exam: Vitals:   02/19/16 1436  BP: 136/84  Pulse: 77  Resp: 18  Temp: 98.3 F (36.8 C)  TempSrc: Oral  SpO2: 96%  Weight: 203 lb 3.2 oz (92.2 kg)  Height: 6' (1.829 m)   Body mass index is 27.56 kg/m.  General- elderly male, well built, in no acute distress Head- normocephalic, atraumatic Nose- no maxillary or frontal sinus tenderness, no nasal discharge Throat- moist mucus membrane Eyes- PERRLA, EOMI, no pallor, no icterus, no discharge, normal conjunctiva, normal sclera Neck- no cervical lymphadenopathy Cardiovascular- normal s1,s2, no murmur Respiratory- bilateral clear to auscultation, no wheeze, no rhonchi, no crackles, no use of accessory muscles Abdomen- bowel sounds present, soft, non tender Musculoskeletal- able to move all 4 extremities, on wheelchair, generalized weakness, no leg edema, limited range of motion to his shoulders Neurological- alert and oriented to self and place only.  Skin- warm and  dry Psychiatry- normal mood and affect    Labs reviewed: Basic Metabolic Panel:  Recent Labs  10/28/15 0425  10/30/15 0400  02/12/16 2155 02/13/16 0423 02/15/16 0557  NA 142  < > 140  < > 140 139 139  K 3.1*  < > 3.6  < > 4.0 3.7 3.4*  CL 106  < > 106  < > 106 106 106  CO2 30  < > 29  < > 20* 22 23  GLUCOSE 143*  < > 137*  < > 174* 178* 101*  BUN 14  < > 13  < > 23* 22* 26*  CREATININE 1.39*  < > 1.16  < > 1.61* 1.61* 1.27*  CALCIUM 8.2*  < > 8.3*  < > 8.7* 8.0* 8.4*  MG 1.6*  --  1.7  --   --   --   --   < > = values in this interval not displayed. Liver Function Tests:  Recent Labs  01/23/16 1340 02/12/16 2155 02/13/16 0423  AST 30 24 25   ALT 11* 15* 17  ALKPHOS 81 63 56  BILITOT 1.4* 0.4 0.5  PROT 7.6 6.7 6.4*  ALBUMIN 4.0 3.4* 3.3*    Recent Labs  10/17/15 1852  LIPASE 22   No results for input(s): AMMONIA in the last 8760 hours. CBC:  Recent Labs  02/12/16 2155 02/13/16 0423 02/15/16 0557  WBC 9.1 9.8 6.9  NEUTROABS 7.3 8.2* 4.5  HGB 12.7* 12.2* 12.5*  HCT 37.4* 37.2* 37.9*  MCV 89.9 92.1 90.0  PLT PLATELET CLUMPS NOTED ON SMEAR, COUNT APPEARS ADEQUATE 166 171   Cardiac Enzymes:  Recent Labs  10/27/15 2143 10/28/15 0425 10/28/15 1255  TROPONINI <0.03 <0.03 <0.03   BNP: Invalid input(s): POCBNP CBG:  Recent Labs  02/16/16 0636 02/16/16 1119 02/16/16 1626  GLUCAP 106* 132* 111*    Radiological Exams: Dg Chest 2 View  Result Date: 02/14/2016 CLINICAL DATA:  Cough, history of diabetes and dementia, evaluate for possible pneumonia EXAM: CHEST  2 VIEW COMPARISON:  Chest x-ray of 02/12/2016 FINDINGS: No pneumonia is seen and there is no evidence of pleural effusion. There is some peribronchial thickening present which could indicate bronchitis. Mediastinal and hilar contours are unremarkable. Cardiomegaly is stable and a dual lead permanent pacemaker remains. There are degenerative changes throughout the thoracic spine. IMPRESSION: 1.  No definite pneumonia.  Question bronchitis. 2. Stable cardiomegaly with pacemaker. Electronically Signed   By: Ivar Drape M.D.   On: 02/14/2016 08:27   Ct Head Wo Contrast  Result Date: 02/13/2016 CLINICAL DATA:  Altered mental status. History of hypertension, diabetes, Alzheimer's disease. EXAM: CT HEAD WITHOUT CONTRAST TECHNIQUE: Contiguous axial images were obtained from the base of the skull through the vertex without intravenous contrast. COMPARISON:  CT HEAD October 17, 2015 FINDINGS: BRAIN: Moderate to severe ventriculomegaly with disproportionate temporal horn enlargement compatible with temporal atrophy. Confluent supratentorial white matter hypodensities. Old bilateral basal ganglia lacunar infarcts. No intraparenchymal hemorrhage, mass effect, midline shift or acute large vascular territory infarcts. Basal cisterns are patent. VASCULAR: Moderate calcific atherosclerosis of the carotid siphons. SKULL: No skull fracture. No significant scalp soft tissue swelling. SINUSES/ORBITS: Mild paranasal sinus mucosal thickening. Mastoid air cells are well aerated. Soft tissue within LEFT external auditory canal compatible with cerumen. Status post bilateral ocular lens implants. The included ocular globes and orbital contents are non-suspicious. OTHER: None. IMPRESSION: No acute intracranial process. Moderate to severe brain atrophy, with disproportionate temporal lobe volume loss associated with neurodegenerative disorders. Severe chronic small vessel ischemic disease. Old basal ganglia infarcts. Electronically Signed   By: Elon Alas M.D.   On: 02/13/2016 02:26   Ct Abdomen Pelvis W Contrast  Result Date: 01/24/2016 CLINICAL DATA:  Lower abdominal pain, weakness, history of Alzheimer's dementia, status post appendectomy, cholecystectomy, transurethral resection of the prostate EXAM: CT ABDOMEN AND PELVIS WITH CONTRAST TECHNIQUE: Multidetector CT imaging of the abdomen and pelvis was performed  using the standard protocol following bolus administration of intravenous contrast. CONTRAST:  72mL ISOVUE-300 IOPAMIDOL (ISOVUE-300) INJECTION 61% COMPARISON:  None. FINDINGS: Lower chest: Lung bases are unremarkable. Partially visualized cardiac pacemaker leads. Borderline cardiomegaly. Hepatobiliary: The patient is status post cholecystectomy. Mild intrahepatic biliary ductal dilatation probable postcholecystectomy. No focal hepatic mass. Punctate calcifications are noted within liver and spleen probable from prior granulomatous disease. Pancreas: Enhanced pancreas is normal. Spleen: Enhanced spleen is normal size. Adrenals/Urinary Tract: No adrenal gland mass. Mild lobulated renal contour. No hydronephrosis or hydroureter. Stable exophytic cyst posterior aspect of the left kidney measures 1.2 cm. Delayed renal images shows bilateral renal symmetrical excretion. Bilateral visualized proximal ureter is unremarkable. There is thickening of urinary bladder wall. Cystitis or chronic inflammation cannot be excluded. Stomach/Bowel: Oral contrast material was given to the patient. No small bowel obstruction. There is mild thickening of the wall of terminal ileum. Mild distal enteritis cannot be excluded. No pericecal inflammation. The patient is status post appendectomy. No colonic obstruction. Multiple diverticula are noted in sigmoid colon. No evidence of acute colitis or diverticulitis. Mild redundant sigmoid colon. Moderate gas and some stool noted within rectum Vascular/Lymphatic: No aortic aneurysm. Atherosclerotic calcifications of abdominal aorta and iliac arteries. Atherosclerotic calcifications are noted SMA origin and bilateral renal artery origin. No adenopathy. Reproductive: The patient is status post transurethral resection of prostate gland. No pelvic masses noted. Other: No ascites or free abdominal air.  No inguinal adenopathy. Musculoskeletal: Sagittal images of the spine shows osteopenia and mild  degenerative changes thoracolumbar spine. IMPRESSION: 1. Status post cholecystectomy. 2. No hydronephrosis or hydroureter.  Stable left renal cyst. 3. There is mild thickening of the wall of terminal small bowel/terminal ileum. Mild distal enteritis cannot be excluded. 4. Status post appendectomy.  No pericecal inflammation. 5. Multiple sigmoid colon diverticula. No evidence of acute colitis or diverticulitis. 6. There is thickening of urinary bladder wall. Chronic inflammation or cystitis cannot be excluded. Status post transurethral resection of prostate gland 7. Mild degenerative changes thoracolumbar spine. 8. Moderate stool and gas noted within rectum. Electronically Signed   By: Julien Girt  Pop M.D.   On: 01/24/2016 10:57   Dg Chest Portable 1 View  Result Date: 02/12/2016 CLINICAL DATA:  Altered mental status, fever EXAM: PORTABLE CHEST 1 VIEW COMPARISON:  01/23/2016 FINDINGS: Left-sided pacing device with leads over the right atrium and right ventricle as before. Minimal subsegmental atelectasis left lung base. No acute consolidation or effusion. Stable borderline to mild cardiomegaly. No pneumothorax. IMPRESSION: 1. Minimal subsegmental atelectasis left base.  No acute infiltrate. 2. Stable borderline to mild cardiomegaly Electronically Signed   By: Donavan Foil M.D.   On: 02/12/2016 22:57   Dg Chest Portable 1 View  Result Date: 01/23/2016 CLINICAL DATA:  Adenoma status EXAM: PORTABLE CHEST 1 VIEW COMPARISON:  October 27, 2015 FINDINGS: There is a pacemaker present with lead tips attached to the right atrium and right ventricle. No pneumothorax. No edema or consolidation. Heart is upper normal in size with pulmonary vascularity within normal limits. There is atherosclerotic calcification in the aorta. No adenopathy. No bone lesions. IMPRESSION: No edema or consolidation. Stable cardiac silhouette. Aortic atherosclerosis. Electronically Signed   By: Lowella Grip III M.D.   On: 01/23/2016 14:27     Assessment/Plan  Generalized weakness From deconditioning. Will need for patient to work with physical therapy and occupational therapy to help regain his strength and balance. Fall precautions to be taken.  Influenza A pneumonia Clinically improved. He has completed 5 days course of Tamiflu. Monitor clinically.  Dementia without behavioral disturbance Continue Namenda and Aricept. Provide supportive care.  Type 2 diabetes mellitus with vascular disease Continue glipizide 10 mg daily and monitor blood sugar reading. Continue foot care.   Atrial fibrillation Rate controlled. Continue Toprol-XL current regimen. Continue baby aspirin daily.   Chronic depression Continue sertraline 50 mg daily and get psych to evaluate.  Restless leg syndrome Continue pramipexole  Esophageal stricture Control symptom. Continue pantoprazole for now. No changes made.  Hypertension Continue amlodipine 5 mg daily, Imdur 30 mg daily and Toprol-XL 12.5 mg daily. Monitor blood pressure every shift for now.     Goals of care: short term rehabilitation   Labs/tests ordered: CBC, BMP  Family/ staff Communication: reviewed care plan with patient and nursing supervisor    Blanchie Serve, MD Internal Medicine [place], [place] [address] Cell Phone (Monday-Friday 8 am - 5 pm): [phone] On Call: [phone] and follow prompts after 5 pm and on weekends Office Phone: [phone] Office Fax: [phone]

## 2016-02-20 ENCOUNTER — Encounter: Payer: Medicare Other | Admitting: Cardiovascular Disease

## 2016-02-20 DIAGNOSIS — R278 Other lack of coordination: Secondary | ICD-10-CM | POA: Diagnosis not present

## 2016-02-20 DIAGNOSIS — M6281 Muscle weakness (generalized): Secondary | ICD-10-CM | POA: Diagnosis not present

## 2016-02-20 DIAGNOSIS — R2689 Other abnormalities of gait and mobility: Secondary | ICD-10-CM | POA: Diagnosis not present

## 2016-02-21 DIAGNOSIS — R278 Other lack of coordination: Secondary | ICD-10-CM | POA: Diagnosis not present

## 2016-02-21 DIAGNOSIS — M6281 Muscle weakness (generalized): Secondary | ICD-10-CM | POA: Diagnosis not present

## 2016-02-21 DIAGNOSIS — R2689 Other abnormalities of gait and mobility: Secondary | ICD-10-CM | POA: Diagnosis not present

## 2016-02-22 ENCOUNTER — Encounter: Payer: Self-pay | Admitting: *Deleted

## 2016-02-22 DIAGNOSIS — D649 Anemia, unspecified: Secondary | ICD-10-CM | POA: Diagnosis not present

## 2016-02-22 DIAGNOSIS — R278 Other lack of coordination: Secondary | ICD-10-CM | POA: Diagnosis not present

## 2016-02-22 DIAGNOSIS — I10 Essential (primary) hypertension: Secondary | ICD-10-CM | POA: Diagnosis not present

## 2016-02-22 DIAGNOSIS — M6281 Muscle weakness (generalized): Secondary | ICD-10-CM | POA: Diagnosis not present

## 2016-02-22 DIAGNOSIS — R2689 Other abnormalities of gait and mobility: Secondary | ICD-10-CM | POA: Diagnosis not present

## 2016-02-22 LAB — BASIC METABOLIC PANEL
BUN: 21 mg/dL (ref 4–21)
Creatinine: 1.2 mg/dL (ref 0.6–1.3)
GLUCOSE: 131 mg/dL
Potassium: 3.7 mmol/L (ref 3.4–5.3)
SODIUM: 143 mmol/L (ref 137–147)

## 2016-02-22 LAB — CBC AND DIFFERENTIAL
HCT: 41 % (ref 41–53)
Hemoglobin: 14 g/dL (ref 13.5–17.5)
NEUTROS ABS: 3 /uL
PLATELETS: 264 10*3/uL (ref 150–399)
WBC: 6 10^3/mL

## 2016-02-25 DIAGNOSIS — M6281 Muscle weakness (generalized): Secondary | ICD-10-CM | POA: Diagnosis not present

## 2016-02-25 DIAGNOSIS — R278 Other lack of coordination: Secondary | ICD-10-CM | POA: Diagnosis not present

## 2016-02-25 DIAGNOSIS — R2689 Other abnormalities of gait and mobility: Secondary | ICD-10-CM | POA: Diagnosis not present

## 2016-02-26 DIAGNOSIS — M6281 Muscle weakness (generalized): Secondary | ICD-10-CM | POA: Diagnosis not present

## 2016-02-26 DIAGNOSIS — R2689 Other abnormalities of gait and mobility: Secondary | ICD-10-CM | POA: Diagnosis not present

## 2016-02-26 DIAGNOSIS — R278 Other lack of coordination: Secondary | ICD-10-CM | POA: Diagnosis not present

## 2016-02-27 DIAGNOSIS — M6281 Muscle weakness (generalized): Secondary | ICD-10-CM | POA: Diagnosis not present

## 2016-02-27 DIAGNOSIS — R278 Other lack of coordination: Secondary | ICD-10-CM | POA: Diagnosis not present

## 2016-02-27 DIAGNOSIS — R2689 Other abnormalities of gait and mobility: Secondary | ICD-10-CM | POA: Diagnosis not present

## 2016-02-28 DIAGNOSIS — M6281 Muscle weakness (generalized): Secondary | ICD-10-CM | POA: Diagnosis not present

## 2016-02-28 DIAGNOSIS — R2689 Other abnormalities of gait and mobility: Secondary | ICD-10-CM | POA: Diagnosis not present

## 2016-02-28 DIAGNOSIS — R278 Other lack of coordination: Secondary | ICD-10-CM | POA: Diagnosis not present

## 2016-02-29 DIAGNOSIS — M6281 Muscle weakness (generalized): Secondary | ICD-10-CM | POA: Diagnosis not present

## 2016-02-29 DIAGNOSIS — R2689 Other abnormalities of gait and mobility: Secondary | ICD-10-CM | POA: Diagnosis not present

## 2016-02-29 DIAGNOSIS — R278 Other lack of coordination: Secondary | ICD-10-CM | POA: Diagnosis not present

## 2016-03-01 DIAGNOSIS — M6281 Muscle weakness (generalized): Secondary | ICD-10-CM | POA: Diagnosis not present

## 2016-03-01 DIAGNOSIS — R2689 Other abnormalities of gait and mobility: Secondary | ICD-10-CM | POA: Diagnosis not present

## 2016-03-01 DIAGNOSIS — R278 Other lack of coordination: Secondary | ICD-10-CM | POA: Diagnosis not present

## 2016-03-03 DIAGNOSIS — R2689 Other abnormalities of gait and mobility: Secondary | ICD-10-CM | POA: Diagnosis not present

## 2016-03-03 DIAGNOSIS — R278 Other lack of coordination: Secondary | ICD-10-CM | POA: Diagnosis not present

## 2016-03-03 DIAGNOSIS — M6281 Muscle weakness (generalized): Secondary | ICD-10-CM | POA: Diagnosis not present

## 2016-03-04 ENCOUNTER — Encounter: Payer: Self-pay | Admitting: Adult Health

## 2016-03-04 ENCOUNTER — Non-Acute Institutional Stay (SKILLED_NURSING_FACILITY): Payer: Medicare Other | Admitting: Adult Health

## 2016-03-04 DIAGNOSIS — G2581 Restless legs syndrome: Secondary | ICD-10-CM

## 2016-03-04 DIAGNOSIS — K219 Gastro-esophageal reflux disease without esophagitis: Secondary | ICD-10-CM

## 2016-03-04 DIAGNOSIS — F0390 Unspecified dementia without behavioral disturbance: Secondary | ICD-10-CM | POA: Diagnosis not present

## 2016-03-04 DIAGNOSIS — R2689 Other abnormalities of gait and mobility: Secondary | ICD-10-CM | POA: Diagnosis not present

## 2016-03-04 DIAGNOSIS — E1159 Type 2 diabetes mellitus with other circulatory complications: Secondary | ICD-10-CM | POA: Diagnosis not present

## 2016-03-04 DIAGNOSIS — I10 Essential (primary) hypertension: Secondary | ICD-10-CM | POA: Diagnosis not present

## 2016-03-04 DIAGNOSIS — M6281 Muscle weakness (generalized): Secondary | ICD-10-CM | POA: Diagnosis not present

## 2016-03-04 DIAGNOSIS — I482 Chronic atrial fibrillation, unspecified: Secondary | ICD-10-CM

## 2016-03-04 DIAGNOSIS — F32A Depression, unspecified: Secondary | ICD-10-CM

## 2016-03-04 DIAGNOSIS — J09X1 Influenza due to identified novel influenza A virus with pneumonia: Secondary | ICD-10-CM

## 2016-03-04 DIAGNOSIS — N183 Chronic kidney disease, stage 3 unspecified: Secondary | ICD-10-CM

## 2016-03-04 DIAGNOSIS — R278 Other lack of coordination: Secondary | ICD-10-CM | POA: Diagnosis not present

## 2016-03-04 DIAGNOSIS — F329 Major depressive disorder, single episode, unspecified: Secondary | ICD-10-CM | POA: Diagnosis not present

## 2016-03-04 DIAGNOSIS — I2510 Atherosclerotic heart disease of native coronary artery without angina pectoris: Secondary | ICD-10-CM | POA: Diagnosis not present

## 2016-03-04 NOTE — Progress Notes (Signed)
DATE:  03/04/2016   MRN:  [id number]  BIRTHDAY: 1926-10-28  Facility:  [facility]ursing Home Location:  [place] [facility] [place] Room Number: 1003-B  LEVEL OF CARE:  SNF (31)  Contact Information    Name Relation Home Work Thomas B Spouse [phone]  [phone]   Costella Hatcher [phone]     Rubie Maid [phone]         Code Status History    Date Active Date Inactive Code Status Order ID Comments User Context   02/13/2016  3:08 AM 02/16/2016 10:53 PM DNR [id number]  Rise Patience, MD ED   01/23/2016  6:26 PM 01/26/2016  3:19 PM DNR [id number]  Debbe Odea, MD ED   10/27/2015  3:54 PM 10/31/2015  9:22 PM DNR [id number]  Ivor Costa, MD ED   10/17/2015  9:49 PM 10/21/2015  8:19 PM DNR [id number]  Rise Patience, MD ED   10/17/2015  9:34 PM 10/17/2015  9:48 PM Full Code [id number]  Rise Patience, MD ED   09/05/2013 10:40 PM 09/06/2013  3:08 AM DNR [id number]  Carmin Muskrat, MD ED    Questions for Most Recent Historical Code Status (Order [id number])    Question Answer Comment   In the event of cardiac or respiratory ARREST Do not call a "code blue"    In the event of cardiac or respiratory ARREST Do not perform Intubation, CPR, defibrillation or ACLS    In the event of cardiac or respiratory ARREST Use medication by any route, position, wound care, and other measures to relive pain and suffering. May use oxygen, suction and manual treatment of airway obstruction as needed for comfort.        Chief Complaint  Patient presents with  . Discharge Notes    HISTORY OF PRESENT ILLNESS:  This is an 51-YO male who is for discharge to ALF.  He has been admitted to [facility] on 02/16/16 for short-term rehabilitation following an admission at [facility] 02/12/16-02/16/16. He has PMH of dementia, CAD, tachybradycardia syndrome S/P pacemaker placement and atrial fibrillation. He was having increasing confusion, fever, shortness of  breath and productive cough. He was placed on empiric antibiotics. He tested + flu A and was started on Tamiflu. Chest x-ray was negative for PNA so vancomycin and ceftriaxone were discontinued.  Patient was admitted to this facility for short-term rehabilitation after the patient's recent hospitalization.  Patient has completed SNF rehabilitation and therapy has cleared the patient for discharge.  PAST MEDICAL HISTORY:  Past Medical History:  Diagnosis Date  . Anemia 06/2012  . Atrial fibrillation ([place])   . BPH (benign prostatic hyperplasia)    Followed by Dr. Matilde Sprang  . Brady-tachy syndrome ([place])    04/17/2009 MDT pacer Enrhythmatr fib  . CAD (coronary artery disease)   . Dementia in Alzheimer's disease   . Depression   . Diabetes ([place])   . Diverticulosis 02/26/2010   Sigmoid colon  . Duodenitis 02/26/2010  . Elevated PSA   . Episodic VTach, pacer    Followed by Dr. Avon Gully  . Fall 03/2014   elbow fluid   . Gastritis 02/26/2010   moderate  . Hiatal hernia 02/26/2010  . Hyperlipidemia   . Hypertension   . Memory loss   . Microalbuminuria   . Urinary incontinence      CURRENT MEDICATIONS: Reviewed  Patient's Medications  New Prescriptions   No medications on file  Previous Medications   ALPRAZOLAM (  XANAX) 0.25 MG TABLET    Take 1 tablet (0.25 mg total) by mouth 2 (two) times daily as needed for anxiety.   AMLODIPINE (NORVASC) 5 MG TABLET    Take 5 mg by mouth daily.    ASPIRIN 81 MG TABLET    Take 81 mg by mouth daily.   BENZONATATE (TESSALON) 100 MG CAPSULE    Take 100 mg by mouth 2 (two) times daily as needed for cough.   DONEPEZIL (ARICEPT) 23 MG TABS TABLET    Take 1 tablet (23 mg total) by mouth at bedtime.   FERROUS SULFATE 325 (65 FE) MG TABLET    Take 325 mg by mouth 2 (two) times daily.   GLIPIZIDE (GLUCOTROL) 10 MG TABLET    Take 10 mg by mouth daily.   HYDROCODONE-ACETAMINOPHEN (NORCO/VICODIN) 5-325 MG TABLET    Take 1 tablet by mouth 2 (two) times daily.     ISOSORBIDE MONONITRATE (IMDUR) 30 MG 24 HR TABLET    Take 30 mg by mouth daily.   MEMANTINE (NAMENDA) 10 MG TABLET    Take 1 tablet (10 mg total) by mouth 2 (two) times daily.   METOPROLOL SUCCINATE (TOPROL-XL) 25 MG 24 HR TABLET    Take 0.5 tablets (12.5 mg total) by mouth daily. Take with or immediately following a meal.   OSELTAMIVIR (TAMIFLU) 30 MG CAPSULE    Take 1 capsule (30 mg total) by mouth 2 (two) times daily.   PANTOPRAZOLE (PROTONIX) 40 MG TABLET    Take 40 mg by mouth daily.    PRAMIPEXOLE (MIRAPEX) 0.125 MG TABLET    Take 2 tablets (0.25 mg total) by mouth every evening. Two hours before bedtime   SERTRALINE (ZOLOFT) 50 MG TABLET    Take 1 tablet (50 mg total) by mouth daily.  Modified Medications   No medications on file  Discontinued Medications   No medications on file     Allergies  Allergen Reactions  . Penicillins Rash    Has patient had a PCN reaction causing immediate rash, facial/tongue/throat swelling, SOB or lightheadedness with hypotension: No  Has patient had a PCN reaction causing severe rash involving mucus membranes or skin necrosis: unknown Has patient had a PCN reaction that required hospitalization: No  Has patient had a PCN reaction occurring within the last 10 years: No  If all of the above answers are "NO", then may proceed with Cephalosporin use.     REVIEW OF SYSTEMS:  GENERAL: no change in appetite, no fatigue, no weight changes, no fever, chills or weakness EYES: Denies change in vision, dry eyes, eye pain, itching or discharge EARS: Denies change in hearing, ringing in ears, or earache NOSE: Denies nasal congestion or epistaxis MOUTH and THROAT: Denies oral discomfort, gingival pain or bleeding, pain from teeth or hoarseness   RESPIRATORY: no cough, SOB, DOE, wheezing, hemoptysis CARDIAC: no chest pain, edema or palpitations GI: no abdominal pain, diarrhea, constipation, heart burn, nausea or vomiting GU: Denies dysuria, frequency,  hematuria, incontinence, or discharge PSYCHIATRIC: Denies feeling of depression or anxiety. No report of hallucinations, insomnia, paranoia, or agitation    PHYSICAL EXAMINATION  GENERAL APPEARANCE: Well nourished. In no acute distress. Normal body habitus SKIN:  Skin is warm and dry.  HEAD: Normal in size and contour. No evidence of trauma EYES: Lids open and close normally. No blepharitis, entropion or ectropion. PERRL. Conjunctivae are clear and sclerae are white. Lenses are without opacity EARS: Pinnae are normal. Patient hears normal voice tunes of  the examiner MOUTH and THROAT: Lips are without lesions. Oral mucosa is moist and without lesions. Tongue is normal in shape, size, and color and without lesions NECK: supple, trachea midline, no neck masses, no thyroid tenderness, no thyromegaly LYMPHATICS: no LAN in the neck, no supraclavicular LAN RESPIRATORY: breathing is even & unlabored, BS CTAB CARDIAC: RRR, no murmur,no extra heart sounds, no edema, left chest pacemaker GI: abdomen soft, normal BS, no masses, no tenderness, no hepatomegaly, no splenomegaly EXTREMITIES:  Able to move 4 extremities PSYCHIATRIC: Alert to self, disoriented to time and place. Affect and behavior are appropriate   LABS/RADIOLOGY: Labs reviewed: Basic Metabolic Panel:  Recent Labs  10/28/15 0425  10/30/15 0400  02/12/16 2155 02/13/16 0423 02/15/16 0557  NA 142  < > 140  < > 140 139 139  K 3.1*  < > 3.6  < > 4.0 3.7 3.4*  CL 106  < > 106  < > 106 106 106  CO2 30  < > 29  < > 20* 22 23  GLUCOSE 143*  < > 137*  < > 174* 178* 101*  BUN 14  < > 13  < > 23* 22* 26*  CREATININE 1.39*  < > 1.16  < > 1.61* 1.61* 1.27*  CALCIUM 8.2*  < > 8.3*  < > 8.7* 8.0* 8.4*  MG 1.6*  --  1.7  --   --   --   --   < > = values in this interval not displayed. Liver Function Tests:  Recent Labs  01/23/16 1340 02/12/16 2155 02/13/16 0423  AST 30 24 25   ALT 11* 15* 17  ALKPHOS 81 63 56  BILITOT 1.4* 0.4 0.5   PROT 7.6 6.7 6.4*  ALBUMIN 4.0 3.4* 3.3*    Recent Labs  10/17/15 1852  LIPASE 22   CBC:  Recent Labs  02/12/16 2155 02/13/16 0423 02/15/16 0557  WBC 9.1 9.8 6.9  NEUTROABS 7.3 8.2* 4.5  HGB 12.7* 12.2* 12.5*  HCT 37.4* 37.2* 37.9*  MCV 89.9 92.1 90.0  PLT PLATELET CLUMPS NOTED ON SMEAR, COUNT APPEARS ADEQUATE 166 171   Cardiac Enzymes:  Recent Labs  10/27/15 2143 10/28/15 0425 10/28/15 1255  TROPONINI <0.03 <0.03 <0.03   CBG:  Recent Labs  02/16/16 0636 02/16/16 1119 02/16/16 1626  GLUCAP 106* 132* 111*      Dg Chest 2 View  Result Date: 02/14/2016 CLINICAL DATA:  Cough, history of diabetes and dementia, evaluate for possible pneumonia EXAM: CHEST  2 VIEW COMPARISON:  Chest x-ray of 02/12/2016 FINDINGS: No pneumonia is seen and there is no evidence of pleural effusion. There is some peribronchial thickening present which could indicate bronchitis. Mediastinal and hilar contours are unremarkable. Cardiomegaly is stable and a dual lead permanent pacemaker remains. There are degenerative changes throughout the thoracic spine. IMPRESSION: 1. No definite pneumonia.  Question bronchitis. 2. Stable cardiomegaly with pacemaker. Electronically Signed   By: Ivar Drape M.D.   On: 02/14/2016 08:27   Ct Head Wo Contrast  Result Date: 02/13/2016 CLINICAL DATA:  Altered mental status. History of hypertension, diabetes, Alzheimer's disease. EXAM: CT HEAD WITHOUT CONTRAST TECHNIQUE: Contiguous axial images were obtained from the base of the skull through the vertex without intravenous contrast. COMPARISON:  CT HEAD October 17, 2015 FINDINGS: BRAIN: Moderate to severe ventriculomegaly with disproportionate temporal horn enlargement compatible with temporal atrophy. Confluent supratentorial white matter hypodensities. Old bilateral basal ganglia lacunar infarcts. No intraparenchymal hemorrhage, mass effect, midline shift or acute large  vascular territory infarcts. Basal cisterns are  patent. VASCULAR: Moderate calcific atherosclerosis of the carotid siphons. SKULL: No skull fracture. No significant scalp soft tissue swelling. SINUSES/ORBITS: Mild paranasal sinus mucosal thickening. Mastoid air cells are well aerated. Soft tissue within LEFT external auditory canal compatible with cerumen. Status post bilateral ocular lens implants. The included ocular globes and orbital contents are non-suspicious. OTHER: None. IMPRESSION: No acute intracranial process. Moderate to severe brain atrophy, with disproportionate temporal lobe volume loss associated with neurodegenerative disorders. Severe chronic small vessel ischemic disease. Old basal ganglia infarcts. Electronically Signed   By: Elon Alas M.D.   On: 02/13/2016 02:26   Ct Abdomen Pelvis W Contrast  Result Date: 01/24/2016 CLINICAL DATA:  Lower abdominal pain, weakness, history of Alzheimer's dementia, status post appendectomy, cholecystectomy, transurethral resection of the prostate EXAM: CT ABDOMEN AND PELVIS WITH CONTRAST TECHNIQUE: Multidetector CT imaging of the abdomen and pelvis was performed using the standard protocol following bolus administration of intravenous contrast. CONTRAST:  38mL ISOVUE-300 IOPAMIDOL (ISOVUE-300) INJECTION 61% COMPARISON:  None. FINDINGS: Lower chest: Lung bases are unremarkable. Partially visualized cardiac pacemaker leads. Borderline cardiomegaly. Hepatobiliary: The patient is status post cholecystectomy. Mild intrahepatic biliary ductal dilatation probable postcholecystectomy. No focal hepatic mass. Punctate calcifications are noted within liver and spleen probable from prior granulomatous disease. Pancreas: Enhanced pancreas is normal. Spleen: Enhanced spleen is normal size. Adrenals/Urinary Tract: No adrenal gland mass. Mild lobulated renal contour. No hydronephrosis or hydroureter. Stable exophytic cyst posterior aspect of the left kidney measures 1.2 cm. Delayed renal images shows bilateral  renal symmetrical excretion. Bilateral visualized proximal ureter is unremarkable. There is thickening of urinary bladder wall. Cystitis or chronic inflammation cannot be excluded. Stomach/Bowel: Oral contrast material was given to the patient. No small bowel obstruction. There is mild thickening of the wall of terminal ileum. Mild distal enteritis cannot be excluded. No pericecal inflammation. The patient is status post appendectomy. No colonic obstruction. Multiple diverticula are noted in sigmoid colon. No evidence of acute colitis or diverticulitis. Mild redundant sigmoid colon. Moderate gas and some stool noted within rectum Vascular/Lymphatic: No aortic aneurysm. Atherosclerotic calcifications of abdominal aorta and iliac arteries. Atherosclerotic calcifications are noted SMA origin and bilateral renal artery origin. No adenopathy. Reproductive: The patient is status post transurethral resection of prostate gland. No pelvic masses noted. Other: No ascites or free abdominal air.  No inguinal adenopathy. Musculoskeletal: Sagittal images of the spine shows osteopenia and mild degenerative changes thoracolumbar spine. IMPRESSION: 1. Status post cholecystectomy. 2. No hydronephrosis or hydroureter.  Stable left renal cyst. 3. There is mild thickening of the wall of terminal small bowel/terminal ileum. Mild distal enteritis cannot be excluded. 4. Status post appendectomy.  No pericecal inflammation. 5. Multiple sigmoid colon diverticula. No evidence of acute colitis or diverticulitis. 6. There is thickening of urinary bladder wall. Chronic inflammation or cystitis cannot be excluded. Status post transurethral resection of prostate gland 7. Mild degenerative changes thoracolumbar spine. 8. Moderate stool and gas noted within rectum. Electronically Signed   By: Lahoma Crocker M.D.   On: 01/24/2016 10:57   Dg Chest Portable 1 View  Result Date: 02/12/2016 CLINICAL DATA:  Altered mental status, fever EXAM: PORTABLE CHEST  1 VIEW COMPARISON:  01/23/2016 FINDINGS: Left-sided pacing device with leads over the right atrium and right ventricle as before. Minimal subsegmental atelectasis left lung base. No acute consolidation or effusion. Stable borderline to mild cardiomegaly. No pneumothorax. IMPRESSION: 1. Minimal subsegmental atelectasis left base.  No acute infiltrate. 2. Stable borderline  to mild cardiomegaly Electronically Signed   By: Donavan Foil M.D.   On: 02/12/2016 22:57   Dg Chest Portable 1 View  Result Date: 01/23/2016 CLINICAL DATA:  Adenoma status EXAM: PORTABLE CHEST 1 VIEW COMPARISON:  October 27, 2015 FINDINGS: There is a pacemaker present with lead tips attached to the right atrium and right ventricle. No pneumothorax. No edema or consolidation. Heart is upper normal in size with pulmonary vascularity within normal limits. There is atherosclerotic calcification in the aorta. No adenopathy. No bone lesions. IMPRESSION: No edema or consolidation. Stable cardiac silhouette. Aortic atherosclerosis. Electronically Signed   By: Lowella Grip III M.D.   On: 01/23/2016 14:27    ASSESSMENT/PLAN:  Influenza A - recently completed Tamiflul 02/21/16, continue benzonatate 100 mg 1 capsule by mouth twice a day when necessary  Hypertension - well-controlled; continue amlodipine besylate 5 mg 1 tab by mouth daily and Toprol-XL 25 mg 1/2 tab = 12.5 mg by mouth daily  Atrial fibrillation - rate controlled; continue Toprol-XL 25 mg 1/2 tab = 12.5 mg PO Q D; not on anticoagulation; has Meditronic PPM  Alzheimer's dementia - continue supportive care; continue Namenda 10 mg 1 tab by mouth twice a day and Aricept 23 mg 1 tab by mouth daily at bedtime; fall precautions  Diabetes mellitus, type II - Continue glipizide 10 mg 1 tab by mouth daily; CBG daily Lab Results  Component Value Date   HGBA1C 7.1 (H) 10/28/2015   CAD - no chest pains; continue Imdur ER 30 mg 1 tab by mouth daily and aspirin 81 mg 1 tab by  mouth daily  Chronic kidney disease, stage III - stable Lab Results  Component Value Date   CREATININE 1.27 (H) 02/15/2016   Restless leg syndrome - continue pramipexole 0.125 mg given 2 tabs = 0.25 mg by mouth daily evening  Chronic depression - mood is stable; continue sertraline 50 mg 1 tab by mouth daily  GERD - continue pantoprazole 40 mg 1 tab by mouth daily     I have filled out patient's discharge paperwork and written prescriptions.    DME provided: None  Total discharge time: Less than 30 minutes  Discharge time involved coordination of the discharge process with social worker, nursing staff and therapy department.      Monina C. [place]  -  NP [employer] [phone]

## 2016-03-05 DIAGNOSIS — R2689 Other abnormalities of gait and mobility: Secondary | ICD-10-CM | POA: Diagnosis not present

## 2016-03-05 DIAGNOSIS — M6281 Muscle weakness (generalized): Secondary | ICD-10-CM | POA: Diagnosis not present

## 2016-03-05 DIAGNOSIS — R278 Other lack of coordination: Secondary | ICD-10-CM | POA: Diagnosis not present

## 2016-03-10 DIAGNOSIS — R4182 Altered mental status, unspecified: Secondary | ICD-10-CM | POA: Diagnosis not present

## 2016-03-10 DIAGNOSIS — E119 Type 2 diabetes mellitus without complications: Secondary | ICD-10-CM | POA: Diagnosis not present

## 2016-03-10 DIAGNOSIS — I10 Essential (primary) hypertension: Secondary | ICD-10-CM | POA: Diagnosis not present

## 2016-03-10 DIAGNOSIS — J9601 Acute respiratory failure with hypoxia: Secondary | ICD-10-CM | POA: Diagnosis not present

## 2016-03-24 DIAGNOSIS — E119 Type 2 diabetes mellitus without complications: Secondary | ICD-10-CM | POA: Diagnosis not present

## 2016-03-24 DIAGNOSIS — G4701 Insomnia due to medical condition: Secondary | ICD-10-CM | POA: Diagnosis not present

## 2016-03-24 DIAGNOSIS — F0150 Vascular dementia without behavioral disturbance: Secondary | ICD-10-CM | POA: Diagnosis not present

## 2016-03-31 DIAGNOSIS — F0150 Vascular dementia without behavioral disturbance: Secondary | ICD-10-CM | POA: Diagnosis not present

## 2016-03-31 DIAGNOSIS — E119 Type 2 diabetes mellitus without complications: Secondary | ICD-10-CM | POA: Diagnosis not present

## 2016-04-07 DIAGNOSIS — E119 Type 2 diabetes mellitus without complications: Secondary | ICD-10-CM | POA: Diagnosis not present

## 2016-04-07 DIAGNOSIS — G4700 Insomnia, unspecified: Secondary | ICD-10-CM | POA: Diagnosis not present

## 2016-04-07 DIAGNOSIS — F0150 Vascular dementia without behavioral disturbance: Secondary | ICD-10-CM | POA: Diagnosis not present

## 2016-05-01 DIAGNOSIS — L209 Atopic dermatitis, unspecified: Secondary | ICD-10-CM | POA: Diagnosis not present

## 2016-05-01 DIAGNOSIS — G4700 Insomnia, unspecified: Secondary | ICD-10-CM | POA: Diagnosis not present

## 2016-05-01 DIAGNOSIS — E119 Type 2 diabetes mellitus without complications: Secondary | ICD-10-CM | POA: Diagnosis not present

## 2016-05-01 DIAGNOSIS — I10 Essential (primary) hypertension: Secondary | ICD-10-CM | POA: Diagnosis not present

## 2016-05-07 ENCOUNTER — Ambulatory Visit: Payer: Medicare Other | Admitting: Adult Health

## 2016-05-07 DIAGNOSIS — E119 Type 2 diabetes mellitus without complications: Secondary | ICD-10-CM | POA: Diagnosis not present

## 2016-05-07 DIAGNOSIS — Z79899 Other long term (current) drug therapy: Secondary | ICD-10-CM | POA: Diagnosis not present

## 2016-05-10 DIAGNOSIS — N390 Urinary tract infection, site not specified: Secondary | ICD-10-CM | POA: Diagnosis not present

## 2016-05-12 DIAGNOSIS — I10 Essential (primary) hypertension: Secondary | ICD-10-CM | POA: Diagnosis not present

## 2016-05-12 DIAGNOSIS — F0150 Vascular dementia without behavioral disturbance: Secondary | ICD-10-CM | POA: Diagnosis not present

## 2016-05-12 DIAGNOSIS — F419 Anxiety disorder, unspecified: Secondary | ICD-10-CM | POA: Diagnosis not present

## 2016-05-16 DIAGNOSIS — M549 Dorsalgia, unspecified: Secondary | ICD-10-CM | POA: Diagnosis not present

## 2016-06-16 DIAGNOSIS — E119 Type 2 diabetes mellitus without complications: Secondary | ICD-10-CM | POA: Diagnosis not present

## 2016-07-04 DIAGNOSIS — R41841 Cognitive communication deficit: Secondary | ICD-10-CM | POA: Diagnosis not present

## 2016-07-07 DIAGNOSIS — R41841 Cognitive communication deficit: Secondary | ICD-10-CM | POA: Diagnosis not present

## 2016-07-08 DIAGNOSIS — E1151 Type 2 diabetes mellitus with diabetic peripheral angiopathy without gangrene: Secondary | ICD-10-CM | POA: Diagnosis not present

## 2016-07-08 DIAGNOSIS — M79675 Pain in left toe(s): Secondary | ICD-10-CM | POA: Diagnosis not present

## 2016-07-08 DIAGNOSIS — M79674 Pain in right toe(s): Secondary | ICD-10-CM | POA: Diagnosis not present

## 2016-07-08 DIAGNOSIS — R41841 Cognitive communication deficit: Secondary | ICD-10-CM | POA: Diagnosis not present

## 2016-07-08 DIAGNOSIS — B351 Tinea unguium: Secondary | ICD-10-CM | POA: Diagnosis not present

## 2016-07-09 DIAGNOSIS — R41841 Cognitive communication deficit: Secondary | ICD-10-CM | POA: Diagnosis not present

## 2016-07-10 DIAGNOSIS — R41841 Cognitive communication deficit: Secondary | ICD-10-CM | POA: Diagnosis not present

## 2016-07-12 DIAGNOSIS — R41841 Cognitive communication deficit: Secondary | ICD-10-CM | POA: Diagnosis not present

## 2016-07-14 DIAGNOSIS — R41841 Cognitive communication deficit: Secondary | ICD-10-CM | POA: Diagnosis not present

## 2016-07-15 DIAGNOSIS — R41841 Cognitive communication deficit: Secondary | ICD-10-CM | POA: Diagnosis not present

## 2016-07-16 DIAGNOSIS — R41841 Cognitive communication deficit: Secondary | ICD-10-CM | POA: Diagnosis not present

## 2016-07-17 DIAGNOSIS — R41841 Cognitive communication deficit: Secondary | ICD-10-CM | POA: Diagnosis not present

## 2016-07-18 DIAGNOSIS — F0150 Vascular dementia without behavioral disturbance: Secondary | ICD-10-CM | POA: Diagnosis not present

## 2016-07-18 DIAGNOSIS — N401 Enlarged prostate with lower urinary tract symptoms: Secondary | ICD-10-CM | POA: Diagnosis not present

## 2016-07-18 DIAGNOSIS — R338 Other retention of urine: Secondary | ICD-10-CM | POA: Diagnosis not present

## 2016-07-18 DIAGNOSIS — R32 Unspecified urinary incontinence: Secondary | ICD-10-CM | POA: Diagnosis not present

## 2016-07-21 DIAGNOSIS — R41841 Cognitive communication deficit: Secondary | ICD-10-CM | POA: Diagnosis not present

## 2016-07-22 DIAGNOSIS — R41841 Cognitive communication deficit: Secondary | ICD-10-CM | POA: Diagnosis not present

## 2016-07-23 DIAGNOSIS — R41841 Cognitive communication deficit: Secondary | ICD-10-CM | POA: Diagnosis not present

## 2016-07-28 DIAGNOSIS — R41841 Cognitive communication deficit: Secondary | ICD-10-CM | POA: Diagnosis not present

## 2016-07-29 DIAGNOSIS — R41841 Cognitive communication deficit: Secondary | ICD-10-CM | POA: Diagnosis not present

## 2016-08-01 DIAGNOSIS — R41841 Cognitive communication deficit: Secondary | ICD-10-CM | POA: Diagnosis not present

## 2016-08-04 DIAGNOSIS — R41841 Cognitive communication deficit: Secondary | ICD-10-CM | POA: Diagnosis not present

## 2016-08-05 DIAGNOSIS — R41841 Cognitive communication deficit: Secondary | ICD-10-CM | POA: Diagnosis not present

## 2016-08-06 DIAGNOSIS — M79675 Pain in left toe(s): Secondary | ICD-10-CM | POA: Diagnosis not present

## 2016-08-06 DIAGNOSIS — E1142 Type 2 diabetes mellitus with diabetic polyneuropathy: Secondary | ICD-10-CM | POA: Diagnosis not present

## 2016-08-06 DIAGNOSIS — M79674 Pain in right toe(s): Secondary | ICD-10-CM | POA: Diagnosis not present

## 2016-08-06 DIAGNOSIS — L600 Ingrowing nail: Secondary | ICD-10-CM | POA: Diagnosis not present

## 2016-08-06 DIAGNOSIS — R41841 Cognitive communication deficit: Secondary | ICD-10-CM | POA: Diagnosis not present

## 2016-08-06 DIAGNOSIS — L03032 Cellulitis of left toe: Secondary | ICD-10-CM | POA: Diagnosis not present

## 2016-08-06 DIAGNOSIS — B351 Tinea unguium: Secondary | ICD-10-CM | POA: Diagnosis not present

## 2016-08-11 DIAGNOSIS — R41841 Cognitive communication deficit: Secondary | ICD-10-CM | POA: Diagnosis not present

## 2016-08-11 DIAGNOSIS — F0390 Unspecified dementia without behavioral disturbance: Secondary | ICD-10-CM | POA: Diagnosis not present

## 2016-08-12 DIAGNOSIS — F0390 Unspecified dementia without behavioral disturbance: Secondary | ICD-10-CM | POA: Diagnosis not present

## 2016-08-12 DIAGNOSIS — R41841 Cognitive communication deficit: Secondary | ICD-10-CM | POA: Diagnosis not present

## 2016-08-13 DIAGNOSIS — R41841 Cognitive communication deficit: Secondary | ICD-10-CM | POA: Diagnosis not present

## 2016-08-13 DIAGNOSIS — F0390 Unspecified dementia without behavioral disturbance: Secondary | ICD-10-CM | POA: Diagnosis not present

## 2016-09-02 DIAGNOSIS — F0390 Unspecified dementia without behavioral disturbance: Secondary | ICD-10-CM | POA: Diagnosis not present

## 2016-09-02 DIAGNOSIS — R41841 Cognitive communication deficit: Secondary | ICD-10-CM | POA: Diagnosis not present

## 2016-09-03 DIAGNOSIS — F0390 Unspecified dementia without behavioral disturbance: Secondary | ICD-10-CM | POA: Diagnosis not present

## 2016-09-03 DIAGNOSIS — R41841 Cognitive communication deficit: Secondary | ICD-10-CM | POA: Diagnosis not present

## 2016-09-04 DIAGNOSIS — F0390 Unspecified dementia without behavioral disturbance: Secondary | ICD-10-CM | POA: Diagnosis not present

## 2016-09-04 DIAGNOSIS — E119 Type 2 diabetes mellitus without complications: Secondary | ICD-10-CM | POA: Diagnosis not present

## 2016-09-04 DIAGNOSIS — R41841 Cognitive communication deficit: Secondary | ICD-10-CM | POA: Diagnosis not present

## 2016-09-08 DIAGNOSIS — R41841 Cognitive communication deficit: Secondary | ICD-10-CM | POA: Diagnosis not present

## 2016-09-08 DIAGNOSIS — F0390 Unspecified dementia without behavioral disturbance: Secondary | ICD-10-CM | POA: Diagnosis not present

## 2016-09-09 DIAGNOSIS — B351 Tinea unguium: Secondary | ICD-10-CM | POA: Diagnosis not present

## 2016-09-09 DIAGNOSIS — M79674 Pain in right toe(s): Secondary | ICD-10-CM | POA: Diagnosis not present

## 2016-09-09 DIAGNOSIS — M79675 Pain in left toe(s): Secondary | ICD-10-CM | POA: Diagnosis not present

## 2016-09-09 DIAGNOSIS — E1151 Type 2 diabetes mellitus with diabetic peripheral angiopathy without gangrene: Secondary | ICD-10-CM | POA: Diagnosis not present

## 2016-09-09 DIAGNOSIS — L84 Corns and callosities: Secondary | ICD-10-CM | POA: Diagnosis not present

## 2016-09-10 DIAGNOSIS — R41841 Cognitive communication deficit: Secondary | ICD-10-CM | POA: Diagnosis not present

## 2016-09-10 DIAGNOSIS — F0390 Unspecified dementia without behavioral disturbance: Secondary | ICD-10-CM | POA: Diagnosis not present

## 2016-09-12 DIAGNOSIS — R41841 Cognitive communication deficit: Secondary | ICD-10-CM | POA: Diagnosis not present

## 2016-09-12 DIAGNOSIS — F0390 Unspecified dementia without behavioral disturbance: Secondary | ICD-10-CM | POA: Diagnosis not present

## 2016-09-15 DIAGNOSIS — F0390 Unspecified dementia without behavioral disturbance: Secondary | ICD-10-CM | POA: Diagnosis not present

## 2016-09-15 DIAGNOSIS — R41841 Cognitive communication deficit: Secondary | ICD-10-CM | POA: Diagnosis not present

## 2016-09-17 DIAGNOSIS — R41841 Cognitive communication deficit: Secondary | ICD-10-CM | POA: Diagnosis not present

## 2016-09-17 DIAGNOSIS — F0390 Unspecified dementia without behavioral disturbance: Secondary | ICD-10-CM | POA: Diagnosis not present

## 2016-09-19 DIAGNOSIS — R41841 Cognitive communication deficit: Secondary | ICD-10-CM | POA: Diagnosis not present

## 2016-09-19 DIAGNOSIS — F0390 Unspecified dementia without behavioral disturbance: Secondary | ICD-10-CM | POA: Diagnosis not present

## 2016-09-19 DIAGNOSIS — M549 Dorsalgia, unspecified: Secondary | ICD-10-CM | POA: Diagnosis not present

## 2016-09-19 DIAGNOSIS — F329 Major depressive disorder, single episode, unspecified: Secondary | ICD-10-CM | POA: Diagnosis not present

## 2016-09-19 DIAGNOSIS — E119 Type 2 diabetes mellitus without complications: Secondary | ICD-10-CM | POA: Diagnosis not present

## 2016-09-19 DIAGNOSIS — F0150 Vascular dementia without behavioral disturbance: Secondary | ICD-10-CM | POA: Diagnosis not present

## 2016-09-22 DIAGNOSIS — R41841 Cognitive communication deficit: Secondary | ICD-10-CM | POA: Diagnosis not present

## 2016-09-22 DIAGNOSIS — F0390 Unspecified dementia without behavioral disturbance: Secondary | ICD-10-CM | POA: Diagnosis not present

## 2016-09-24 DIAGNOSIS — R41841 Cognitive communication deficit: Secondary | ICD-10-CM | POA: Diagnosis not present

## 2016-09-24 DIAGNOSIS — F0390 Unspecified dementia without behavioral disturbance: Secondary | ICD-10-CM | POA: Diagnosis not present

## 2016-09-25 DIAGNOSIS — F0390 Unspecified dementia without behavioral disturbance: Secondary | ICD-10-CM | POA: Diagnosis not present

## 2016-09-25 DIAGNOSIS — R41841 Cognitive communication deficit: Secondary | ICD-10-CM | POA: Diagnosis not present

## 2016-09-26 DIAGNOSIS — R5383 Other fatigue: Secondary | ICD-10-CM | POA: Diagnosis not present

## 2016-09-30 DIAGNOSIS — F0390 Unspecified dementia without behavioral disturbance: Secondary | ICD-10-CM | POA: Diagnosis not present

## 2016-09-30 DIAGNOSIS — R41841 Cognitive communication deficit: Secondary | ICD-10-CM | POA: Diagnosis not present

## 2016-10-01 DIAGNOSIS — R41841 Cognitive communication deficit: Secondary | ICD-10-CM | POA: Diagnosis not present

## 2016-10-01 DIAGNOSIS — F0390 Unspecified dementia without behavioral disturbance: Secondary | ICD-10-CM | POA: Diagnosis not present

## 2016-10-03 DIAGNOSIS — F0390 Unspecified dementia without behavioral disturbance: Secondary | ICD-10-CM | POA: Diagnosis not present

## 2016-10-03 DIAGNOSIS — R41841 Cognitive communication deficit: Secondary | ICD-10-CM | POA: Diagnosis not present

## 2016-10-05 DIAGNOSIS — R404 Transient alteration of awareness: Secondary | ICD-10-CM | POA: Diagnosis not present

## 2016-10-05 DIAGNOSIS — R279 Unspecified lack of coordination: Secondary | ICD-10-CM | POA: Diagnosis not present

## 2016-10-05 DIAGNOSIS — Z743 Need for continuous supervision: Secondary | ICD-10-CM | POA: Diagnosis not present

## 2016-10-05 DIAGNOSIS — R402411 Glasgow coma scale score 13-15, in the field [EMT or ambulance]: Secondary | ICD-10-CM | POA: Diagnosis not present

## 2016-10-05 DIAGNOSIS — R4182 Altered mental status, unspecified: Secondary | ICD-10-CM | POA: Diagnosis not present

## 2016-10-09 DIAGNOSIS — R5383 Other fatigue: Secondary | ICD-10-CM | POA: Diagnosis not present

## 2016-10-09 DIAGNOSIS — F0150 Vascular dementia without behavioral disturbance: Secondary | ICD-10-CM | POA: Diagnosis not present

## 2016-10-09 DIAGNOSIS — F329 Major depressive disorder, single episode, unspecified: Secondary | ICD-10-CM | POA: Diagnosis not present

## 2016-10-25 DIAGNOSIS — N390 Urinary tract infection, site not specified: Secondary | ICD-10-CM | POA: Diagnosis not present

## 2016-10-25 DIAGNOSIS — N289 Disorder of kidney and ureter, unspecified: Secondary | ICD-10-CM | POA: Diagnosis not present

## 2016-10-25 DIAGNOSIS — Z7984 Long term (current) use of oral hypoglycemic drugs: Secondary | ICD-10-CM | POA: Diagnosis not present

## 2016-10-25 DIAGNOSIS — R404 Transient alteration of awareness: Secondary | ICD-10-CM | POA: Diagnosis not present

## 2016-10-25 DIAGNOSIS — R531 Weakness: Secondary | ICD-10-CM | POA: Diagnosis not present

## 2016-10-25 DIAGNOSIS — E1165 Type 2 diabetes mellitus with hyperglycemia: Secondary | ICD-10-CM | POA: Diagnosis not present

## 2016-10-25 DIAGNOSIS — F419 Anxiety disorder, unspecified: Secondary | ICD-10-CM | POA: Diagnosis not present

## 2016-10-25 DIAGNOSIS — F4489 Other dissociative and conversion disorders: Secondary | ICD-10-CM | POA: Diagnosis not present

## 2016-10-25 DIAGNOSIS — Z743 Need for continuous supervision: Secondary | ICD-10-CM | POA: Diagnosis not present

## 2016-10-25 DIAGNOSIS — Z7982 Long term (current) use of aspirin: Secondary | ICD-10-CM | POA: Diagnosis not present

## 2016-10-25 DIAGNOSIS — I10 Essential (primary) hypertension: Secondary | ICD-10-CM | POA: Diagnosis not present

## 2016-10-25 DIAGNOSIS — F05 Delirium due to known physiological condition: Secondary | ICD-10-CM | POA: Diagnosis not present

## 2016-10-29 DIAGNOSIS — N390 Urinary tract infection, site not specified: Secondary | ICD-10-CM | POA: Diagnosis not present

## 2016-10-29 DIAGNOSIS — F0150 Vascular dementia without behavioral disturbance: Secondary | ICD-10-CM | POA: Diagnosis not present

## 2016-11-05 DIAGNOSIS — M549 Dorsalgia, unspecified: Secondary | ICD-10-CM | POA: Diagnosis not present

## 2016-11-05 DIAGNOSIS — G459 Transient cerebral ischemic attack, unspecified: Secondary | ICD-10-CM | POA: Diagnosis not present

## 2016-11-05 DIAGNOSIS — I10 Essential (primary) hypertension: Secondary | ICD-10-CM | POA: Diagnosis not present

## 2016-11-06 DIAGNOSIS — N390 Urinary tract infection, site not specified: Secondary | ICD-10-CM | POA: Diagnosis not present

## 2016-11-06 DIAGNOSIS — F0390 Unspecified dementia without behavioral disturbance: Secondary | ICD-10-CM | POA: Diagnosis not present

## 2016-11-06 DIAGNOSIS — I10 Essential (primary) hypertension: Secondary | ICD-10-CM | POA: Diagnosis not present

## 2016-11-06 DIAGNOSIS — E119 Type 2 diabetes mellitus without complications: Secondary | ICD-10-CM | POA: Diagnosis not present

## 2016-11-10 DIAGNOSIS — F0390 Unspecified dementia without behavioral disturbance: Secondary | ICD-10-CM | POA: Diagnosis not present

## 2016-11-10 DIAGNOSIS — R41841 Cognitive communication deficit: Secondary | ICD-10-CM | POA: Diagnosis not present

## 2016-11-10 DIAGNOSIS — Z23 Encounter for immunization: Secondary | ICD-10-CM | POA: Diagnosis not present

## 2016-11-11 DIAGNOSIS — R41841 Cognitive communication deficit: Secondary | ICD-10-CM | POA: Diagnosis not present

## 2016-11-11 DIAGNOSIS — F0390 Unspecified dementia without behavioral disturbance: Secondary | ICD-10-CM | POA: Diagnosis not present

## 2016-11-12 DIAGNOSIS — R41841 Cognitive communication deficit: Secondary | ICD-10-CM | POA: Diagnosis not present

## 2016-11-12 DIAGNOSIS — F0390 Unspecified dementia without behavioral disturbance: Secondary | ICD-10-CM | POA: Diagnosis not present

## 2016-11-13 DIAGNOSIS — R41841 Cognitive communication deficit: Secondary | ICD-10-CM | POA: Diagnosis not present

## 2016-11-13 DIAGNOSIS — F0390 Unspecified dementia without behavioral disturbance: Secondary | ICD-10-CM | POA: Diagnosis not present

## 2016-11-17 DIAGNOSIS — G9340 Encephalopathy, unspecified: Secondary | ICD-10-CM | POA: Diagnosis not present

## 2016-11-17 DIAGNOSIS — G2581 Restless legs syndrome: Secondary | ICD-10-CM | POA: Diagnosis present

## 2016-11-17 DIAGNOSIS — G9341 Metabolic encephalopathy: Secondary | ICD-10-CM | POA: Diagnosis not present

## 2016-11-17 DIAGNOSIS — Z87440 Personal history of urinary (tract) infections: Secondary | ICD-10-CM | POA: Diagnosis not present

## 2016-11-17 DIAGNOSIS — I4891 Unspecified atrial fibrillation: Secondary | ICD-10-CM | POA: Diagnosis present

## 2016-11-17 DIAGNOSIS — Z880 Allergy status to penicillin: Secondary | ICD-10-CM | POA: Diagnosis not present

## 2016-11-17 DIAGNOSIS — Z79899 Other long term (current) drug therapy: Secondary | ICD-10-CM | POA: Diagnosis not present

## 2016-11-17 DIAGNOSIS — Z950 Presence of cardiac pacemaker: Secondary | ICD-10-CM | POA: Diagnosis not present

## 2016-11-17 DIAGNOSIS — F05 Delirium due to known physiological condition: Secondary | ICD-10-CM | POA: Diagnosis not present

## 2016-11-17 DIAGNOSIS — N179 Acute kidney failure, unspecified: Secondary | ICD-10-CM | POA: Diagnosis present

## 2016-11-17 DIAGNOSIS — R41841 Cognitive communication deficit: Secondary | ICD-10-CM | POA: Diagnosis not present

## 2016-11-17 DIAGNOSIS — G9389 Other specified disorders of brain: Secondary | ICD-10-CM | POA: Diagnosis not present

## 2016-11-17 DIAGNOSIS — Z7984 Long term (current) use of oral hypoglycemic drugs: Secondary | ICD-10-CM | POA: Diagnosis not present

## 2016-11-17 DIAGNOSIS — E119 Type 2 diabetes mellitus without complications: Secondary | ICD-10-CM | POA: Diagnosis present

## 2016-11-17 DIAGNOSIS — M6281 Muscle weakness (generalized): Secondary | ICD-10-CM | POA: Diagnosis not present

## 2016-11-17 DIAGNOSIS — R269 Unspecified abnormalities of gait and mobility: Secondary | ICD-10-CM | POA: Diagnosis present

## 2016-11-17 DIAGNOSIS — I10 Essential (primary) hypertension: Secondary | ICD-10-CM | POA: Diagnosis not present

## 2016-11-17 DIAGNOSIS — N390 Urinary tract infection, site not specified: Secondary | ICD-10-CM | POA: Diagnosis not present

## 2016-11-17 DIAGNOSIS — Z7982 Long term (current) use of aspirin: Secondary | ICD-10-CM | POA: Diagnosis not present

## 2016-11-17 DIAGNOSIS — I6523 Occlusion and stenosis of bilateral carotid arteries: Secondary | ICD-10-CM | POA: Diagnosis not present

## 2016-11-17 DIAGNOSIS — N400 Enlarged prostate without lower urinary tract symptoms: Secondary | ICD-10-CM | POA: Diagnosis present

## 2016-11-17 DIAGNOSIS — F419 Anxiety disorder, unspecified: Secondary | ICD-10-CM | POA: Diagnosis present

## 2016-11-17 DIAGNOSIS — F0390 Unspecified dementia without behavioral disturbance: Secondary | ICD-10-CM | POA: Diagnosis present

## 2016-11-17 DIAGNOSIS — R2681 Unsteadiness on feet: Secondary | ICD-10-CM | POA: Diagnosis not present

## 2016-11-17 DIAGNOSIS — Z743 Need for continuous supervision: Secondary | ICD-10-CM | POA: Diagnosis not present

## 2016-11-17 DIAGNOSIS — E860 Dehydration: Secondary | ICD-10-CM | POA: Diagnosis not present

## 2016-11-17 DIAGNOSIS — R2689 Other abnormalities of gait and mobility: Secondary | ICD-10-CM | POA: Diagnosis not present

## 2016-11-17 DIAGNOSIS — R4182 Altered mental status, unspecified: Secondary | ICD-10-CM | POA: Diagnosis not present

## 2016-11-17 DIAGNOSIS — I480 Paroxysmal atrial fibrillation: Secondary | ICD-10-CM | POA: Diagnosis not present

## 2016-11-17 DIAGNOSIS — E876 Hypokalemia: Secondary | ICD-10-CM | POA: Diagnosis present

## 2016-11-17 DIAGNOSIS — R1312 Dysphagia, oropharyngeal phase: Secondary | ICD-10-CM | POA: Diagnosis not present

## 2016-11-17 DIAGNOSIS — I6789 Other cerebrovascular disease: Secondary | ICD-10-CM | POA: Diagnosis not present

## 2016-11-20 DIAGNOSIS — R630 Anorexia: Secondary | ICD-10-CM | POA: Diagnosis not present

## 2016-11-20 DIAGNOSIS — F05 Delirium due to known physiological condition: Secondary | ICD-10-CM | POA: Diagnosis not present

## 2016-11-20 DIAGNOSIS — R4182 Altered mental status, unspecified: Secondary | ICD-10-CM | POA: Diagnosis not present

## 2016-11-20 DIAGNOSIS — I2510 Atherosclerotic heart disease of native coronary artery without angina pectoris: Secondary | ICD-10-CM | POA: Diagnosis not present

## 2016-11-20 DIAGNOSIS — I4891 Unspecified atrial fibrillation: Secondary | ICD-10-CM | POA: Diagnosis not present

## 2016-11-20 DIAGNOSIS — G9340 Encephalopathy, unspecified: Secondary | ICD-10-CM | POA: Diagnosis not present

## 2016-11-20 DIAGNOSIS — I480 Paroxysmal atrial fibrillation: Secondary | ICD-10-CM | POA: Diagnosis not present

## 2016-11-20 DIAGNOSIS — G9341 Metabolic encephalopathy: Secondary | ICD-10-CM | POA: Diagnosis not present

## 2016-11-20 DIAGNOSIS — R531 Weakness: Secondary | ICD-10-CM | POA: Diagnosis not present

## 2016-11-20 DIAGNOSIS — Z743 Need for continuous supervision: Secondary | ICD-10-CM | POA: Diagnosis not present

## 2016-11-20 DIAGNOSIS — R41841 Cognitive communication deficit: Secondary | ICD-10-CM | POA: Diagnosis not present

## 2016-11-20 DIAGNOSIS — F329 Major depressive disorder, single episode, unspecified: Secondary | ICD-10-CM | POA: Diagnosis not present

## 2016-11-20 DIAGNOSIS — E119 Type 2 diabetes mellitus without complications: Secondary | ICD-10-CM | POA: Diagnosis not present

## 2016-11-20 DIAGNOSIS — R2689 Other abnormalities of gait and mobility: Secondary | ICD-10-CM | POA: Diagnosis not present

## 2016-11-20 DIAGNOSIS — M6281 Muscle weakness (generalized): Secondary | ICD-10-CM | POA: Diagnosis not present

## 2016-11-20 DIAGNOSIS — Z7409 Other reduced mobility: Secondary | ICD-10-CM | POA: Diagnosis not present

## 2016-11-20 DIAGNOSIS — I10 Essential (primary) hypertension: Secondary | ICD-10-CM | POA: Diagnosis not present

## 2016-11-20 DIAGNOSIS — N179 Acute kidney failure, unspecified: Secondary | ICD-10-CM | POA: Diagnosis not present

## 2016-11-20 DIAGNOSIS — G2581 Restless legs syndrome: Secondary | ICD-10-CM | POA: Diagnosis not present

## 2016-11-20 DIAGNOSIS — R1312 Dysphagia, oropharyngeal phase: Secondary | ICD-10-CM | POA: Diagnosis not present

## 2016-11-20 DIAGNOSIS — F0150 Vascular dementia without behavioral disturbance: Secondary | ICD-10-CM | POA: Diagnosis not present

## 2016-11-21 DIAGNOSIS — I10 Essential (primary) hypertension: Secondary | ICD-10-CM | POA: Diagnosis not present

## 2016-11-21 DIAGNOSIS — G2581 Restless legs syndrome: Secondary | ICD-10-CM | POA: Diagnosis not present

## 2016-11-21 DIAGNOSIS — F329 Major depressive disorder, single episode, unspecified: Secondary | ICD-10-CM | POA: Diagnosis not present

## 2016-11-21 DIAGNOSIS — I4891 Unspecified atrial fibrillation: Secondary | ICD-10-CM | POA: Diagnosis not present

## 2016-11-24 DIAGNOSIS — I4891 Unspecified atrial fibrillation: Secondary | ICD-10-CM | POA: Diagnosis not present

## 2016-11-24 DIAGNOSIS — G2581 Restless legs syndrome: Secondary | ICD-10-CM | POA: Diagnosis not present

## 2016-11-24 DIAGNOSIS — I10 Essential (primary) hypertension: Secondary | ICD-10-CM | POA: Diagnosis not present

## 2016-11-24 DIAGNOSIS — R531 Weakness: Secondary | ICD-10-CM | POA: Diagnosis not present

## 2016-11-25 DIAGNOSIS — R531 Weakness: Secondary | ICD-10-CM | POA: Diagnosis not present

## 2016-11-25 DIAGNOSIS — Z7409 Other reduced mobility: Secondary | ICD-10-CM | POA: Diagnosis not present

## 2016-11-25 DIAGNOSIS — R630 Anorexia: Secondary | ICD-10-CM | POA: Diagnosis not present

## 2016-12-01 DIAGNOSIS — I2510 Atherosclerotic heart disease of native coronary artery without angina pectoris: Secondary | ICD-10-CM | POA: Diagnosis not present

## 2016-12-01 DIAGNOSIS — F329 Major depressive disorder, single episode, unspecified: Secondary | ICD-10-CM | POA: Diagnosis not present

## 2016-12-01 DIAGNOSIS — R630 Anorexia: Secondary | ICD-10-CM | POA: Diagnosis not present

## 2016-12-01 DIAGNOSIS — I4891 Unspecified atrial fibrillation: Secondary | ICD-10-CM | POA: Diagnosis not present

## 2016-12-08 DIAGNOSIS — I2510 Atherosclerotic heart disease of native coronary artery without angina pectoris: Secondary | ICD-10-CM | POA: Diagnosis not present

## 2016-12-08 DIAGNOSIS — G2581 Restless legs syndrome: Secondary | ICD-10-CM | POA: Diagnosis not present

## 2016-12-08 DIAGNOSIS — F329 Major depressive disorder, single episode, unspecified: Secondary | ICD-10-CM | POA: Diagnosis not present

## 2016-12-08 DIAGNOSIS — F0150 Vascular dementia without behavioral disturbance: Secondary | ICD-10-CM | POA: Diagnosis not present

## 2016-12-12 DIAGNOSIS — R2681 Unsteadiness on feet: Secondary | ICD-10-CM | POA: Diagnosis not present

## 2016-12-12 DIAGNOSIS — R41841 Cognitive communication deficit: Secondary | ICD-10-CM | POA: Diagnosis not present

## 2016-12-12 DIAGNOSIS — M6281 Muscle weakness (generalized): Secondary | ICD-10-CM | POA: Diagnosis not present

## 2016-12-12 DIAGNOSIS — R262 Difficulty in walking, not elsewhere classified: Secondary | ICD-10-CM | POA: Diagnosis not present

## 2016-12-16 DIAGNOSIS — R2681 Unsteadiness on feet: Secondary | ICD-10-CM | POA: Diagnosis not present

## 2016-12-16 DIAGNOSIS — R41841 Cognitive communication deficit: Secondary | ICD-10-CM | POA: Diagnosis not present

## 2016-12-16 DIAGNOSIS — M6281 Muscle weakness (generalized): Secondary | ICD-10-CM | POA: Diagnosis not present

## 2016-12-16 DIAGNOSIS — R262 Difficulty in walking, not elsewhere classified: Secondary | ICD-10-CM | POA: Diagnosis not present

## 2016-12-17 DIAGNOSIS — M6281 Muscle weakness (generalized): Secondary | ICD-10-CM | POA: Diagnosis not present

## 2016-12-17 DIAGNOSIS — R41841 Cognitive communication deficit: Secondary | ICD-10-CM | POA: Diagnosis not present

## 2016-12-17 DIAGNOSIS — R262 Difficulty in walking, not elsewhere classified: Secondary | ICD-10-CM | POA: Diagnosis not present

## 2016-12-17 DIAGNOSIS — R2681 Unsteadiness on feet: Secondary | ICD-10-CM | POA: Diagnosis not present

## 2016-12-18 DIAGNOSIS — R262 Difficulty in walking, not elsewhere classified: Secondary | ICD-10-CM | POA: Diagnosis not present

## 2016-12-18 DIAGNOSIS — R2681 Unsteadiness on feet: Secondary | ICD-10-CM | POA: Diagnosis not present

## 2016-12-18 DIAGNOSIS — M6281 Muscle weakness (generalized): Secondary | ICD-10-CM | POA: Diagnosis not present

## 2016-12-18 DIAGNOSIS — R41841 Cognitive communication deficit: Secondary | ICD-10-CM | POA: Diagnosis not present

## 2016-12-19 DIAGNOSIS — G2581 Restless legs syndrome: Secondary | ICD-10-CM | POA: Diagnosis not present

## 2016-12-19 DIAGNOSIS — K5900 Constipation, unspecified: Secondary | ICD-10-CM | POA: Diagnosis not present

## 2016-12-19 DIAGNOSIS — F0150 Vascular dementia without behavioral disturbance: Secondary | ICD-10-CM | POA: Diagnosis not present

## 2016-12-19 DIAGNOSIS — M549 Dorsalgia, unspecified: Secondary | ICD-10-CM | POA: Diagnosis not present

## 2016-12-23 DIAGNOSIS — R262 Difficulty in walking, not elsewhere classified: Secondary | ICD-10-CM | POA: Diagnosis not present

## 2016-12-23 DIAGNOSIS — M6281 Muscle weakness (generalized): Secondary | ICD-10-CM | POA: Diagnosis not present

## 2016-12-23 DIAGNOSIS — R2681 Unsteadiness on feet: Secondary | ICD-10-CM | POA: Diagnosis not present

## 2016-12-23 DIAGNOSIS — R41841 Cognitive communication deficit: Secondary | ICD-10-CM | POA: Diagnosis not present

## 2016-12-24 DIAGNOSIS — M6281 Muscle weakness (generalized): Secondary | ICD-10-CM | POA: Diagnosis not present

## 2016-12-24 DIAGNOSIS — R41841 Cognitive communication deficit: Secondary | ICD-10-CM | POA: Diagnosis not present

## 2016-12-24 DIAGNOSIS — R262 Difficulty in walking, not elsewhere classified: Secondary | ICD-10-CM | POA: Diagnosis not present

## 2016-12-24 DIAGNOSIS — R2681 Unsteadiness on feet: Secondary | ICD-10-CM | POA: Diagnosis not present

## 2016-12-26 DIAGNOSIS — R262 Difficulty in walking, not elsewhere classified: Secondary | ICD-10-CM | POA: Diagnosis not present

## 2016-12-26 DIAGNOSIS — R2681 Unsteadiness on feet: Secondary | ICD-10-CM | POA: Diagnosis not present

## 2016-12-26 DIAGNOSIS — R41841 Cognitive communication deficit: Secondary | ICD-10-CM | POA: Diagnosis not present

## 2016-12-26 DIAGNOSIS — M6281 Muscle weakness (generalized): Secondary | ICD-10-CM | POA: Diagnosis not present

## 2016-12-29 DIAGNOSIS — R41841 Cognitive communication deficit: Secondary | ICD-10-CM | POA: Diagnosis not present

## 2016-12-29 DIAGNOSIS — R262 Difficulty in walking, not elsewhere classified: Secondary | ICD-10-CM | POA: Diagnosis not present

## 2016-12-29 DIAGNOSIS — M6281 Muscle weakness (generalized): Secondary | ICD-10-CM | POA: Diagnosis not present

## 2016-12-29 DIAGNOSIS — R2681 Unsteadiness on feet: Secondary | ICD-10-CM | POA: Diagnosis not present

## 2016-12-30 DIAGNOSIS — M6281 Muscle weakness (generalized): Secondary | ICD-10-CM | POA: Diagnosis not present

## 2016-12-30 DIAGNOSIS — R41841 Cognitive communication deficit: Secondary | ICD-10-CM | POA: Diagnosis not present

## 2016-12-30 DIAGNOSIS — R262 Difficulty in walking, not elsewhere classified: Secondary | ICD-10-CM | POA: Diagnosis not present

## 2016-12-30 DIAGNOSIS — R2681 Unsteadiness on feet: Secondary | ICD-10-CM | POA: Diagnosis not present

## 2016-12-31 DIAGNOSIS — R2681 Unsteadiness on feet: Secondary | ICD-10-CM | POA: Diagnosis not present

## 2016-12-31 DIAGNOSIS — R262 Difficulty in walking, not elsewhere classified: Secondary | ICD-10-CM | POA: Diagnosis not present

## 2016-12-31 DIAGNOSIS — M6281 Muscle weakness (generalized): Secondary | ICD-10-CM | POA: Diagnosis not present

## 2016-12-31 DIAGNOSIS — R41841 Cognitive communication deficit: Secondary | ICD-10-CM | POA: Diagnosis not present

## 2017-01-06 DIAGNOSIS — R41841 Cognitive communication deficit: Secondary | ICD-10-CM | POA: Diagnosis not present

## 2017-01-06 DIAGNOSIS — R2681 Unsteadiness on feet: Secondary | ICD-10-CM | POA: Diagnosis not present

## 2017-01-06 DIAGNOSIS — M6281 Muscle weakness (generalized): Secondary | ICD-10-CM | POA: Diagnosis not present

## 2017-01-06 DIAGNOSIS — R262 Difficulty in walking, not elsewhere classified: Secondary | ICD-10-CM | POA: Diagnosis not present

## 2017-01-07 DIAGNOSIS — R262 Difficulty in walking, not elsewhere classified: Secondary | ICD-10-CM | POA: Diagnosis not present

## 2017-01-07 DIAGNOSIS — M549 Dorsalgia, unspecified: Secondary | ICD-10-CM | POA: Diagnosis not present

## 2017-01-07 DIAGNOSIS — R41841 Cognitive communication deficit: Secondary | ICD-10-CM | POA: Diagnosis not present

## 2017-01-07 DIAGNOSIS — M6281 Muscle weakness (generalized): Secondary | ICD-10-CM | POA: Diagnosis not present

## 2017-01-07 DIAGNOSIS — I2510 Atherosclerotic heart disease of native coronary artery without angina pectoris: Secondary | ICD-10-CM | POA: Diagnosis not present

## 2017-01-07 DIAGNOSIS — R2681 Unsteadiness on feet: Secondary | ICD-10-CM | POA: Diagnosis not present

## 2017-01-07 DIAGNOSIS — E119 Type 2 diabetes mellitus without complications: Secondary | ICD-10-CM | POA: Diagnosis not present

## 2017-01-07 DIAGNOSIS — F0150 Vascular dementia without behavioral disturbance: Secondary | ICD-10-CM | POA: Diagnosis not present

## 2017-01-08 DIAGNOSIS — R41841 Cognitive communication deficit: Secondary | ICD-10-CM | POA: Diagnosis not present

## 2017-01-08 DIAGNOSIS — R262 Difficulty in walking, not elsewhere classified: Secondary | ICD-10-CM | POA: Diagnosis not present

## 2017-01-08 DIAGNOSIS — R2681 Unsteadiness on feet: Secondary | ICD-10-CM | POA: Diagnosis not present

## 2017-01-08 DIAGNOSIS — M6281 Muscle weakness (generalized): Secondary | ICD-10-CM | POA: Diagnosis not present

## 2017-01-13 DIAGNOSIS — R41841 Cognitive communication deficit: Secondary | ICD-10-CM | POA: Diagnosis not present

## 2017-01-13 DIAGNOSIS — R2681 Unsteadiness on feet: Secondary | ICD-10-CM | POA: Diagnosis not present

## 2017-01-13 DIAGNOSIS — M6281 Muscle weakness (generalized): Secondary | ICD-10-CM | POA: Diagnosis not present

## 2017-01-13 DIAGNOSIS — R262 Difficulty in walking, not elsewhere classified: Secondary | ICD-10-CM | POA: Diagnosis not present

## 2017-01-14 DIAGNOSIS — R109 Unspecified abdominal pain: Secondary | ICD-10-CM | POA: Diagnosis not present

## 2017-01-15 DIAGNOSIS — R41841 Cognitive communication deficit: Secondary | ICD-10-CM | POA: Diagnosis not present

## 2017-01-15 DIAGNOSIS — M6281 Muscle weakness (generalized): Secondary | ICD-10-CM | POA: Diagnosis not present

## 2017-01-15 DIAGNOSIS — G2581 Restless legs syndrome: Secondary | ICD-10-CM | POA: Diagnosis not present

## 2017-01-15 DIAGNOSIS — R319 Hematuria, unspecified: Secondary | ICD-10-CM | POA: Diagnosis not present

## 2017-01-15 DIAGNOSIS — F0150 Vascular dementia without behavioral disturbance: Secondary | ICD-10-CM | POA: Diagnosis not present

## 2017-01-15 DIAGNOSIS — R262 Difficulty in walking, not elsewhere classified: Secondary | ICD-10-CM | POA: Diagnosis not present

## 2017-01-15 DIAGNOSIS — R2681 Unsteadiness on feet: Secondary | ICD-10-CM | POA: Diagnosis not present

## 2017-01-15 DIAGNOSIS — R109 Unspecified abdominal pain: Secondary | ICD-10-CM | POA: Diagnosis not present

## 2017-01-16 DIAGNOSIS — R262 Difficulty in walking, not elsewhere classified: Secondary | ICD-10-CM | POA: Diagnosis not present

## 2017-01-16 DIAGNOSIS — M6281 Muscle weakness (generalized): Secondary | ICD-10-CM | POA: Diagnosis not present

## 2017-01-16 DIAGNOSIS — R41841 Cognitive communication deficit: Secondary | ICD-10-CM | POA: Diagnosis not present

## 2017-01-16 DIAGNOSIS — R2681 Unsteadiness on feet: Secondary | ICD-10-CM | POA: Diagnosis not present

## 2017-01-20 DIAGNOSIS — R2681 Unsteadiness on feet: Secondary | ICD-10-CM | POA: Diagnosis not present

## 2017-01-20 DIAGNOSIS — R41841 Cognitive communication deficit: Secondary | ICD-10-CM | POA: Diagnosis not present

## 2017-01-20 DIAGNOSIS — M6281 Muscle weakness (generalized): Secondary | ICD-10-CM | POA: Diagnosis not present

## 2017-01-20 DIAGNOSIS — R262 Difficulty in walking, not elsewhere classified: Secondary | ICD-10-CM | POA: Diagnosis not present

## 2017-01-21 DIAGNOSIS — R2681 Unsteadiness on feet: Secondary | ICD-10-CM | POA: Diagnosis not present

## 2017-01-21 DIAGNOSIS — R262 Difficulty in walking, not elsewhere classified: Secondary | ICD-10-CM | POA: Diagnosis not present

## 2017-01-21 DIAGNOSIS — M6281 Muscle weakness (generalized): Secondary | ICD-10-CM | POA: Diagnosis not present

## 2017-01-21 DIAGNOSIS — R41841 Cognitive communication deficit: Secondary | ICD-10-CM | POA: Diagnosis not present

## 2017-01-23 DIAGNOSIS — G4700 Insomnia, unspecified: Secondary | ICD-10-CM | POA: Diagnosis not present

## 2017-01-29 DIAGNOSIS — R262 Difficulty in walking, not elsewhere classified: Secondary | ICD-10-CM | POA: Diagnosis not present

## 2017-01-29 DIAGNOSIS — R41841 Cognitive communication deficit: Secondary | ICD-10-CM | POA: Diagnosis not present

## 2017-01-29 DIAGNOSIS — M6281 Muscle weakness (generalized): Secondary | ICD-10-CM | POA: Diagnosis not present

## 2017-01-29 DIAGNOSIS — R2681 Unsteadiness on feet: Secondary | ICD-10-CM | POA: Diagnosis not present

## 2017-01-30 DIAGNOSIS — M6281 Muscle weakness (generalized): Secondary | ICD-10-CM | POA: Diagnosis not present

## 2017-01-30 DIAGNOSIS — R41841 Cognitive communication deficit: Secondary | ICD-10-CM | POA: Diagnosis not present

## 2017-01-30 DIAGNOSIS — R2681 Unsteadiness on feet: Secondary | ICD-10-CM | POA: Diagnosis not present

## 2017-01-30 DIAGNOSIS — R262 Difficulty in walking, not elsewhere classified: Secondary | ICD-10-CM | POA: Diagnosis not present

## 2017-02-04 DIAGNOSIS — R2681 Unsteadiness on feet: Secondary | ICD-10-CM | POA: Diagnosis not present

## 2017-02-04 DIAGNOSIS — R41841 Cognitive communication deficit: Secondary | ICD-10-CM | POA: Diagnosis not present

## 2017-02-04 DIAGNOSIS — M6281 Muscle weakness (generalized): Secondary | ICD-10-CM | POA: Diagnosis not present

## 2017-02-04 DIAGNOSIS — R262 Difficulty in walking, not elsewhere classified: Secondary | ICD-10-CM | POA: Diagnosis not present

## 2017-02-05 DIAGNOSIS — R41841 Cognitive communication deficit: Secondary | ICD-10-CM | POA: Diagnosis not present

## 2017-02-05 DIAGNOSIS — R262 Difficulty in walking, not elsewhere classified: Secondary | ICD-10-CM | POA: Diagnosis not present

## 2017-02-05 DIAGNOSIS — M6281 Muscle weakness (generalized): Secondary | ICD-10-CM | POA: Diagnosis not present

## 2017-02-05 DIAGNOSIS — R2681 Unsteadiness on feet: Secondary | ICD-10-CM | POA: Diagnosis not present

## 2017-02-06 DIAGNOSIS — M6281 Muscle weakness (generalized): Secondary | ICD-10-CM | POA: Diagnosis not present

## 2017-02-06 DIAGNOSIS — R262 Difficulty in walking, not elsewhere classified: Secondary | ICD-10-CM | POA: Diagnosis not present

## 2017-02-06 DIAGNOSIS — R41841 Cognitive communication deficit: Secondary | ICD-10-CM | POA: Diagnosis not present

## 2017-02-06 DIAGNOSIS — R2681 Unsteadiness on feet: Secondary | ICD-10-CM | POA: Diagnosis not present

## 2017-04-10 DEATH — deceased

## 2017-05-15 ENCOUNTER — Telehealth: Payer: Self-pay | Admitting: *Deleted

## 2017-05-15 ENCOUNTER — Encounter: Payer: Self-pay | Admitting: *Deleted

## 2017-05-15 NOTE — Telephone Encounter (Signed)
Spoke with patient contact on file who reports that the patient is deceased.    HIM made aware.

## 2017-05-15 NOTE — Progress Notes (Signed)
Opened in error

## 2017-09-12 IMAGING — CT CT ABD-PELV W/ CM
2 of 5 series · 16 of 46 positions shown, 18 images · IV contrast (ISOVUE)
Comparison: None.

CLINICAL DATA: Diarrhea and right lower quadrant pain for 2 days

EXAM:
CT ABDOMEN AND PELVIS WITH CONTRAST
TECHNIQUE: Multidetector CT imaging of the abdomen and pelvis was performed
using the standard protocol following bolus administration of
intravenous contrast.
CONTRAST:  80mL SAKTTN-L44 IOPAMIDOL (SAKTTN-L44) INJECTION 61%

[Series 2: abd/pel with · axial · 0.89mm/px · z∈[-389,+61]mm · 13 of 104 slices shown, 15 images]
[im 7/104  soft-tissue]
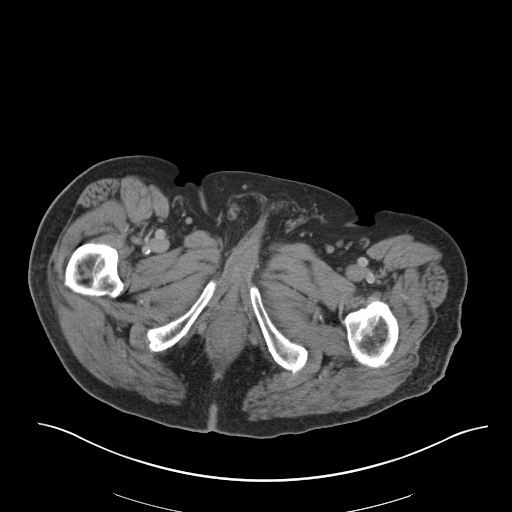
[im 7/104  bone]
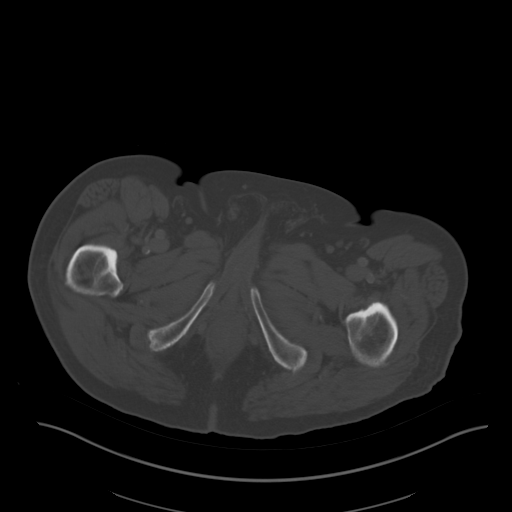
[im 13/104  soft-tissue]
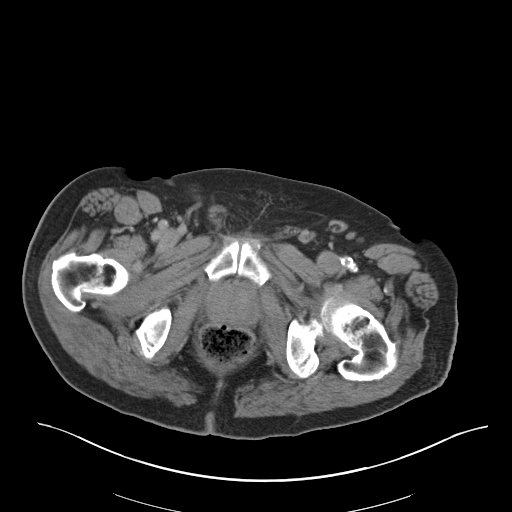
[im 20/104  soft-tissue]
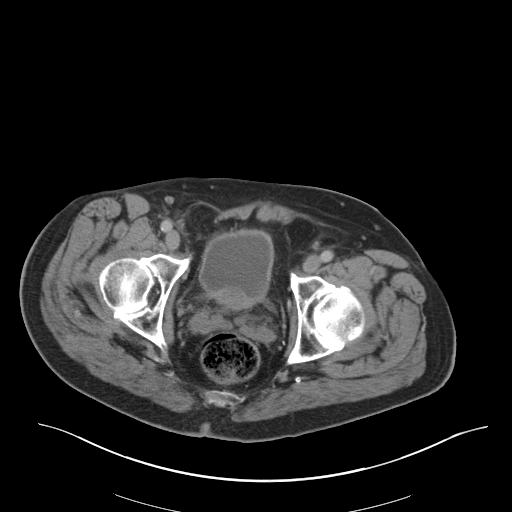
[im 33/104  soft-tissue]
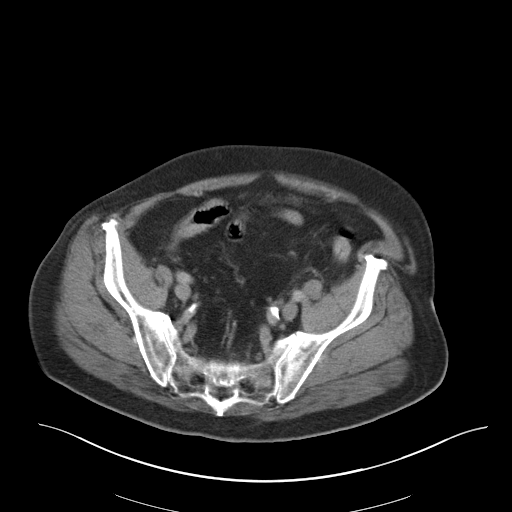
[im 39/104  soft-tissue]
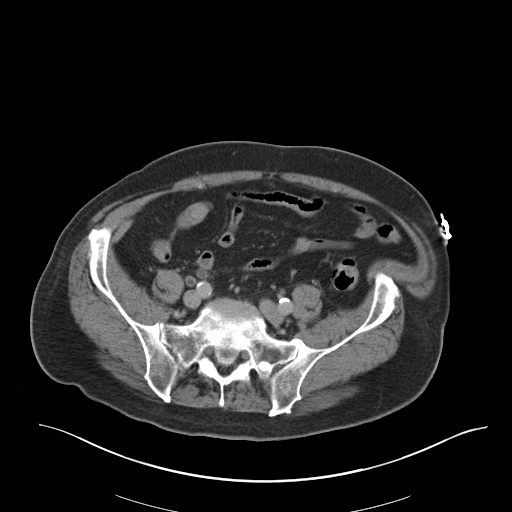
[im 46/104  soft-tissue]
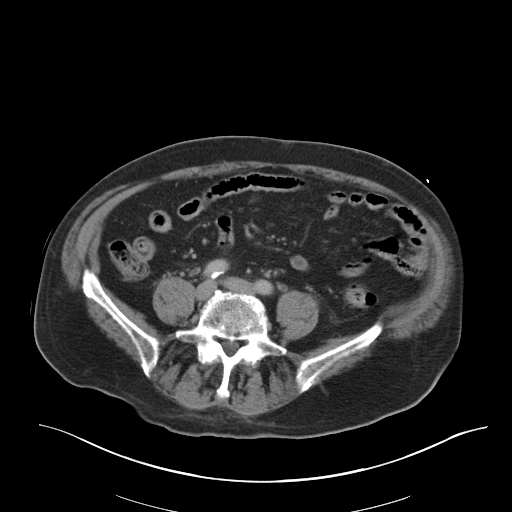
[im 52/104  soft-tissue]
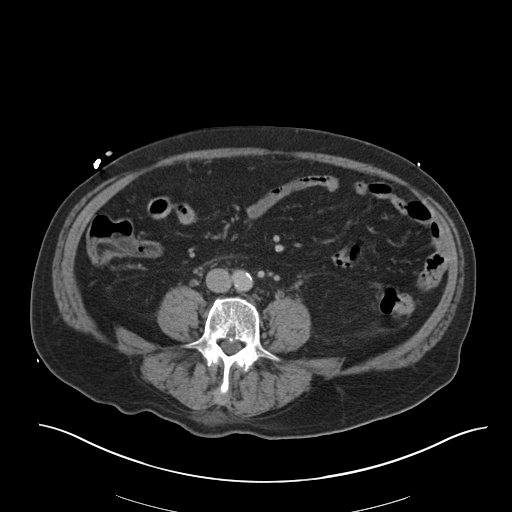
[im 58/104  soft-tissue]
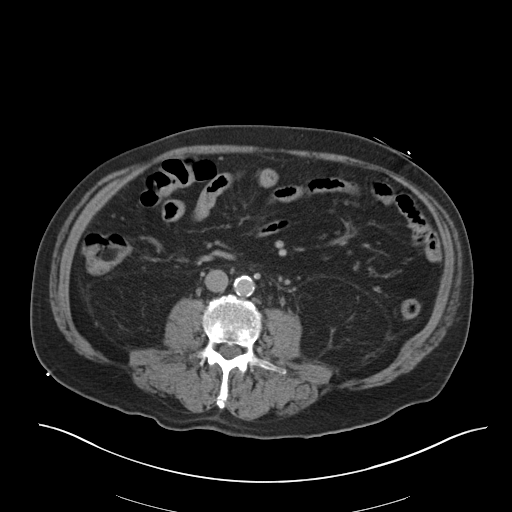
[im 65/104  soft-tissue]
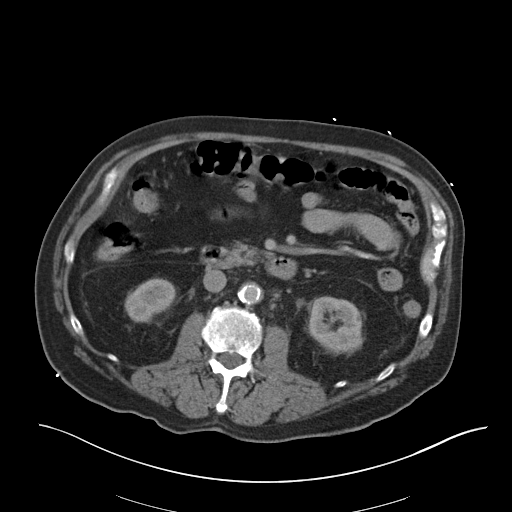
[im 65/104  bone]
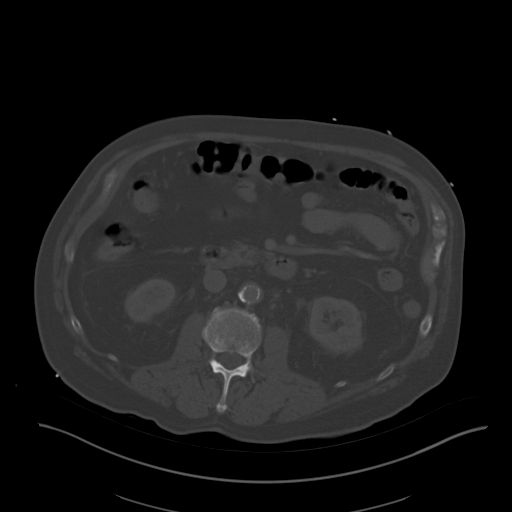
[im 71/104  soft-tissue]
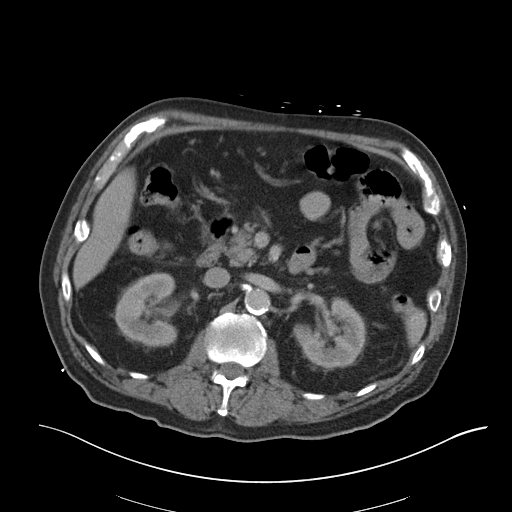
[im 84/104  soft-tissue]
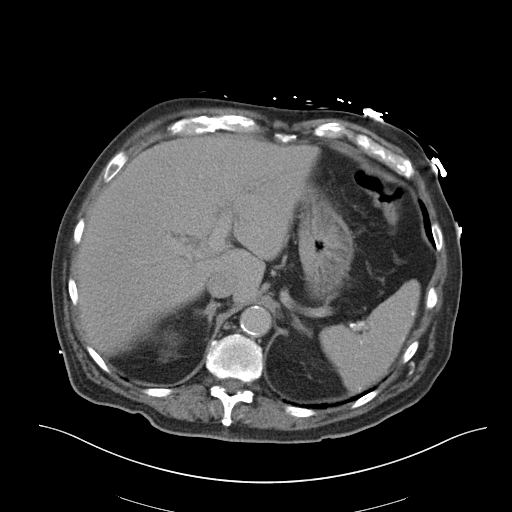
[im 91/104  soft-tissue]
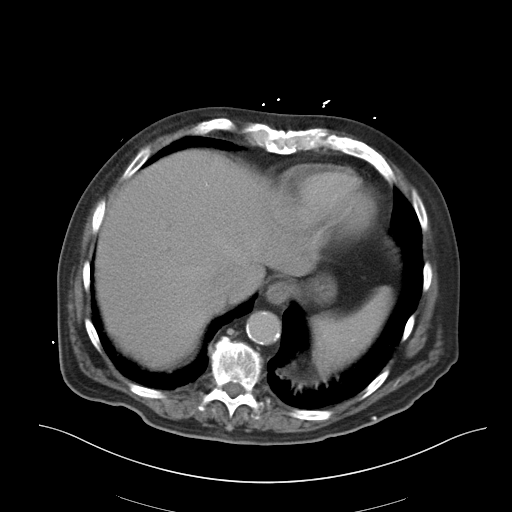
[im 97/104  soft-tissue]
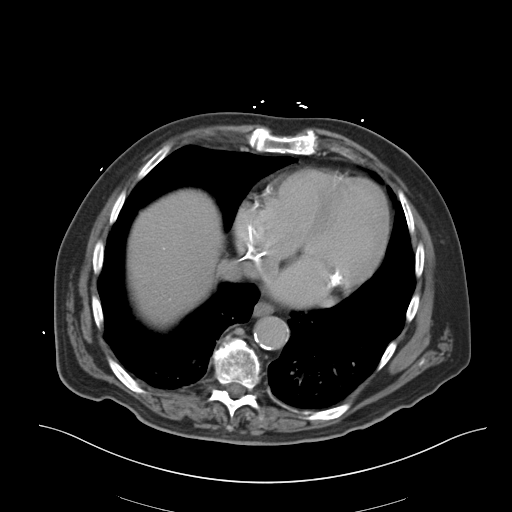

[Series 5: coronal a/|p · coronal · 0.80mm/px · 3 of 149 slices shown]
[im 50/149  soft-tissue]
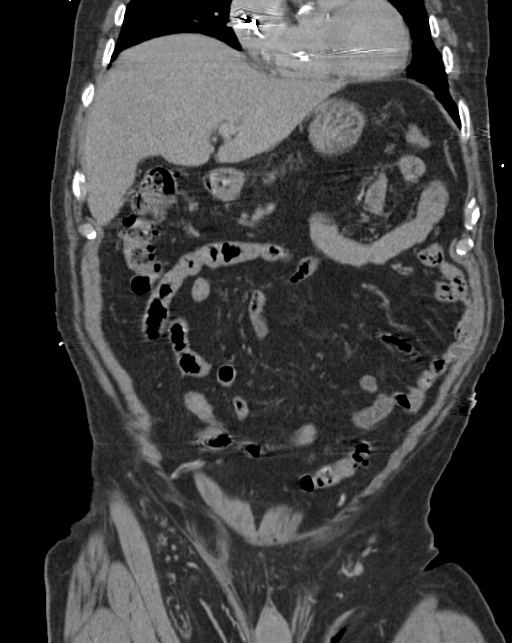
[im 66/149  soft-tissue]
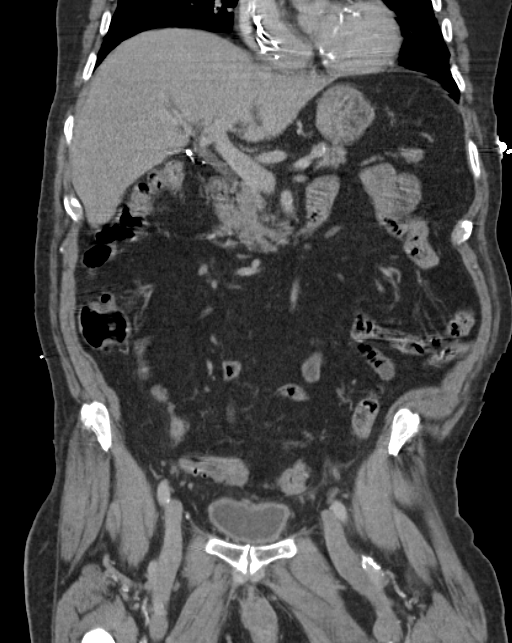
[im 83/149  soft-tissue]
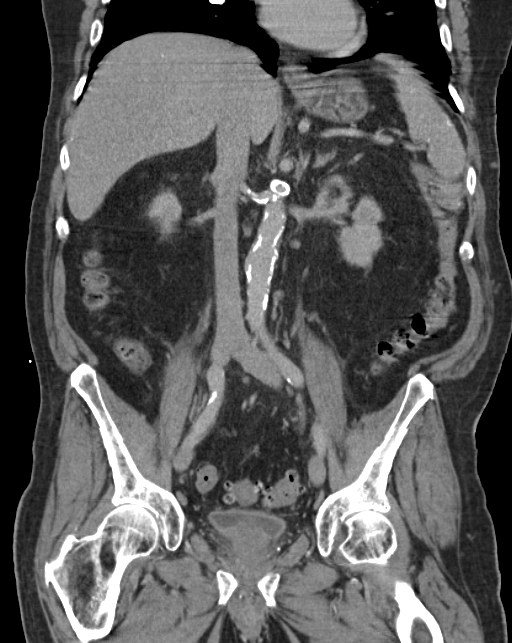

[16 of 46 positions shown; findings below may reference images not displayed]

FINDINGS: Lower chest: No focal infiltrate or sizable effusion is noted.
Multiple calcified hilar lymph nodes are seen. Coronary
calcifications are noted.

Hepatobiliary: The gallbladder has been surgically removed. The
liver is within normal limits.

Pancreas: No mass, inflammatory changes, or other significant
abnormality.

Spleen: Multiple calcified granulomas are seen.

Adrenals/Urinary Tract: The adrenal glands are within normal limits.
Kidneys demonstrate normal enhancement bilaterally with bilateral
excretion. No renal calculi or obstructive changes are noted. The
bladder is decompressed.

Stomach/Bowel: The appendix has been surgically removed. Scattered
diverticular changes are noted particularly in the sigmoid colon.
Some very early inflammatory changes are noted along the sigmoid
colon which may represent early diverticulitis. No perforation or
abscess formation is noted.

Vascular/Lymphatic: Aortoiliac calcifications are noted without
aneurysmal dilatation. No significant lymphadenopathy is noted.

Reproductive: Prostate is mildly enlarged indenting upon the bladder
inferiorly.

Other: None.

Musculoskeletal: Degenerative changes of the lumbar spine are noted.
IMPRESSION: Changes suggestive of early diverticulitis in the sigmoid colon.

Prostatic enlargement.

Postsurgical changes without acute abnormality.
